# Patient Record
Sex: Male | Born: 1950 | Race: White | Hispanic: No | Marital: Single | Smoking: Light tobacco smoker
Health system: Southern US, Community
[De-identification: ages and names within clinical notes are randomized; demographics above are authoritative.]

## PROBLEM LIST (undated history)

## (undated) DIAGNOSIS — Z951 Presence of aortocoronary bypass graft: Secondary | ICD-10-CM

## (undated) DIAGNOSIS — F1011 Alcohol abuse, in remission: Secondary | ICD-10-CM

## (undated) DIAGNOSIS — Z87891 Personal history of nicotine dependence: Secondary | ICD-10-CM

## (undated) DIAGNOSIS — J449 Chronic obstructive pulmonary disease, unspecified: Secondary | ICD-10-CM

## (undated) DIAGNOSIS — R945 Abnormal results of liver function studies: Secondary | ICD-10-CM

## (undated) DIAGNOSIS — Z9889 Other specified postprocedural states: Secondary | ICD-10-CM

## (undated) DIAGNOSIS — I2510 Atherosclerotic heart disease of native coronary artery without angina pectoris: Secondary | ICD-10-CM

## (undated) DIAGNOSIS — I5020 Unspecified systolic (congestive) heart failure: Secondary | ICD-10-CM

## (undated) DIAGNOSIS — E785 Hyperlipidemia, unspecified: Secondary | ICD-10-CM

## (undated) DIAGNOSIS — D509 Iron deficiency anemia, unspecified: Secondary | ICD-10-CM

## (undated) DIAGNOSIS — E871 Hypo-osmolality and hyponatremia: Secondary | ICD-10-CM

## (undated) DIAGNOSIS — I255 Ischemic cardiomyopathy: Secondary | ICD-10-CM

## (undated) HISTORY — PX: MOUTH SURGERY: SHX715

## (undated) HISTORY — PX: NO PAST SURGERIES: SHX2092

---

## 2012-05-11 ENCOUNTER — Emergency Department (HOSPITAL_COMMUNITY): Payer: Managed Care, Other (non HMO)

## 2012-05-11 ENCOUNTER — Encounter (HOSPITAL_COMMUNITY): Payer: Self-pay

## 2012-05-11 ENCOUNTER — Emergency Department (HOSPITAL_COMMUNITY)
Admission: EM | Admit: 2012-05-11 | Discharge: 2012-05-11 | Disposition: A | Discharge status: Home / Self Care | Payer: Managed Care, Other (non HMO) | Attending: Emergency Medicine | Admitting: Emergency Medicine

## 2012-05-11 ENCOUNTER — Other Ambulatory Visit: Payer: Self-pay

## 2012-05-11 DIAGNOSIS — IMO0001 Reserved for inherently not codable concepts without codable children: Secondary | ICD-10-CM

## 2012-05-11 DIAGNOSIS — J4489 Other specified chronic obstructive pulmonary disease: Secondary | ICD-10-CM | POA: Insufficient documentation

## 2012-05-11 DIAGNOSIS — F17200 Nicotine dependence, unspecified, uncomplicated: Secondary | ICD-10-CM | POA: Insufficient documentation

## 2012-05-11 DIAGNOSIS — J449 Chronic obstructive pulmonary disease, unspecified: Secondary | ICD-10-CM | POA: Insufficient documentation

## 2012-05-11 LAB — CBC
HCT: 44.5 % (ref 39.0–52.0)
MCV: 93.5 fL (ref 78.0–100.0)
Platelets: 145 10*3/uL — ABNORMAL LOW (ref 150–400)
RBC: 4.76 MIL/uL (ref 4.22–5.81)
WBC: 9.8 10*3/uL (ref 4.0–10.5)

## 2012-05-11 LAB — COMPREHENSIVE METABOLIC PANEL
ALT: 93 U/L — ABNORMAL HIGH (ref 0–53)
AST: 112 U/L — ABNORMAL HIGH (ref 0–37)
Albumin: 3.4 g/dL — ABNORMAL LOW (ref 3.5–5.2)
Alkaline Phosphatase: 99 U/L (ref 39–117)
GFR calc Af Amer: >90 mL/min (ref >90)
Glucose, Bld: 117 mg/dL — ABNORMAL HIGH (ref 70–99)
Potassium: 3.6 mEq/L (ref 3.5–5.1)
Sodium: 134 mEq/L — ABNORMAL LOW (ref 135–145)
Total Protein: 8.7 g/dL — ABNORMAL HIGH (ref 6.0–8.3)

## 2012-05-11 MED ORDER — AMOXICILLIN 500 MG PO CAPS: 500.0000 mg | ORAL_CAPSULE | Freq: Three times a day (TID) | ORAL | Status: AC

## 2012-05-11 MED ORDER — PREDNISONE 20 MG PO TABS: ORAL_TABLET | ORAL | Status: DC

## 2012-05-11 MED ORDER — IPRATROPIUM BROMIDE 0.02 % IN SOLN
0.5000 mg | RESPIRATORY_TRACT | Status: AC
  Administered 2012-05-11: 0.5 mg via RESPIRATORY_TRACT
  Filled 2012-05-11: qty 2.5

## 2012-05-11 MED ORDER — ALBUTEROL SULFATE (5 MG/ML) 0.5% IN NEBU
5.0000 mg | INHALATION_SOLUTION | Freq: Once | RESPIRATORY_TRACT | Status: AC
  Administered 2012-05-11: 5 mg via RESPIRATORY_TRACT
  Filled 2012-05-11: qty 1

## 2012-05-11 NOTE — Discharge Instructions (Signed)
Use the inhaler or nebulizer for wheezing or shortness of breath. Take the prednisone and amoxil until gone. STOP SMOKING!! See if your wife's doctor will also be your doctor or try the [facility].  Recheck if you get a fever, struggle to breathe or feel worse.

## 2012-05-11 NOTE — ED Provider Notes (Signed)
History   This chart was scribed for Ward Givens, MD by Clarita Crane. The patient was seen in room APA18/APA18. Patient's care was started at 1354.    CSN: [id number]  Arrival date & time 05/11/12  1354   First MD Initiated Contact with Patient 05/11/12 1437      Chief Complaint  Patient presents with  . Shortness of Breath    (Consider location/radiation/quality/duration/timing/severity/associated sxs/prior treatment) HPI Miguel York is a 61 y.o. male who presents to the Emergency Department complaining of waxing and waning SOB onset several months ago and persistent since with associated wheezing and productive cough with green and white sputum. Denies chest pain currently but notes he has experience chest pain described as soreness and pressure associated with SOB previously, the last time was two nights ago when he told his wife he felt like he had something sitting on his chest.  States SOB is worse with exertion and is mildly relieved with use of his wife's breathing treatment. Denies nausea, vomiting, diarrhea, fever, chills, orthopnea, swelling of lower extremities/abdomen. Patient is a current smoker and current drinker (6 pack per day).   PCP none  History reviewed. No pertinent past medical history.  History reviewed. No pertinent past surgical history.  No family history on file. -Mother died of CHF  History  Substance Use Topics  . Smoking status: Current Everyday Smoker 1 1/2 ppd  . Smokeless tobacco: Not on file  . 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   Allergies  Review of patient's allergies indicates no known allergies.  Home Medications  No current outpatient prescriptions on file.  BP 140/82  Pulse 118  Temp 98 F (36.7 C)  Resp 22  Ht 6' (1.829 m)  Wt 175 lb  (79.379 kg)  BMI 23.73 kg/m2  SpO2 94%  Vital signs normal except for tachycardia   Vitals: hypertensive, tachycardic o/w normal Physical Exam  Nursing note and vitals reviewed. Constitutional: He is oriented to person, place, and time. He appears well-developed and well-nourished. No distress.  HENT:  Head: Normocephalic and atraumatic.  Right Ear: External ear normal.  Left Ear: External ear normal.  Mouth/Throat: Oropharynx is clear and moist.       Poor dentition  Eyes: Conjunctivae and EOM are normal. Pupils are equal, round, and reactive to light.  Neck: Neck supple. No tracheal deviation present.  Cardiovascular: Regular rhythm and normal heart sounds.  Tachycardia present.  Exam reveals no gallop and no friction rub.   No murmur heard. Pulmonary/Chest: Effort normal. No respiratory distress. He has wheezes (expiratory). He has no rales.       Rhonchi present, especially at bases bilaterally.   Abdominal: Soft. Bowel sounds are normal. He exhibits no distension. There is no tenderness.  Musculoskeletal: Normal range of motion. He exhibits no edema.  Neurological: He is alert and oriented to person, place, and time. No sensory deficit.  Skin: Skin is warm and dry.  Psychiatric: He has a normal mood and affect. His behavior is normal.    ED Course  Procedures (including critical care time)   Medications  albuterol (PROVENTIL) (5 MG/ML) 0.5% nebulizer solution 5 mg (5 mg Nebulization Given 05/11/12 1531)  ipratropium (ATROVENT) nebulizer solution 0.5 mg (0.5 mg Nebulization Given 05/11/12 1531)  Recheck 17:30 lungs now clear, states he feels better. Wife goes to Dr Felecia Shelling and has meds for nebulizer and unused inhalers.   DIAGNOSTIC STUDIES: Oxygen Saturation is 94% on room air, adequate by my interpretation.    COORDINATION OF CARE: 3:05PM-Patient informed of current plan for treatment and evaluation and agrees with plan at this time.    Results for orders placed  during the hospital encounter of 05/11/12  CBC      Component Value Range   WBC 9.8  4.0 - 10.5 K/uL   RBC 4.76  4.22 - 5.81 MIL/uL   Hemoglobin 15.4  13.0 - 17.0 g/dL   HCT 16.1  09.6 - 04.5 %   MCV 93.5  78.0 - 100.0 fL   MCH 32.4  26.0 - 34.0 pg   MCHC 34.6  30.0 - 36.0 g/dL   RDW 40.9  81.1 - 91.4 %   Platelets 145 (*) 150 - 400 K/uL  PRO B NATRIURETIC PEPTIDE      Component Value Range   Pro B Natriuretic peptide (BNP) 1842.0 (*) 0 - 125 pg/mL  TROPONIN I      Component Value Range   Troponin I <0.30  <0.30 ng/mL  COMPREHENSIVE METABOLIC PANEL      Component Value Range   Sodium 134 (*) 135 - 145 mEq/L   Potassium 3.6  3.5 - 5.1 mEq/L   Chloride 98  96 - 112 mEq/L   CO2 24  19 - 32 mEq/L   Glucose, Bld 117 (*) 70 - 99 mg/dL   BUN 12  6 - 23 mg/dL   Creatinine, Ser 7.82  0.50 - 1.35 mg/dL   Calcium 9.3  8.4 - 95.6 mg/dL   Total Protein 8.7 (*) 6.0 - 8.3 g/dL   Albumin 3.4 (*) 3.5 - 5.2 g/dL   AST 213 (*) 0 - 37 U/L   ALT 93 (*) 0 - 53 U/L   Alkaline Phosphatase 99  39 - 117 U/L   Total Bilirubin 1.9 (*) 0.3 - 1.2 mg/dL   GFR calc non Af Amer >90  >90 mL/min   GFR calc Af Amer >90  >90 mL/min   Laboratory interpretation all normal except elevated LFTs consistent with history of alcohol use, mildly elevated BNP without peripheral edema   Dg Chest 2 View  05/11/2012  *RADIOLOGY REPORT*  Clinical Data: Shortness of breath.  Worsening cough.  Smoker.  CHEST - 2 VIEW  Comparison: None.  Findings: There is mild cardiomegaly.  The thoracic aorta and hilar contours are within normal limits.  Normal pulmonary vascularity. Lung volumes are upper normal to mildly hyperexpanded.  There is a vague ill-defined opacity in the left mid lung on the frontal view that measures approximately 2.3 cm.  This is not well visualized on the lateral projection.  No visible pleural effusion.  IMPRESSION:  1.  Ill-defined faint nodular density in the left mid lung on the frontal view.  No prior chest  radiographs for comparison.  Focal airspace disease or a pulmonary nodule cannot be excluded. Consider further evaluation with chest CT (preferably with intravenous contrast) or short-term follow-up chest radiograph in 3-4 weeks. 2.  Mild cardiomegaly.  Original Report Authenticated By: Britta Mccreedy, M.D.    Date: 05/11/2012  Rate: 117  Rhythm: sinus tachycardia  QRS Axis: right  Intervals: normal  ST/T Wave abnormalities: nonspecific ST/T changes  Conduction Disutrbances:LVH  Narrative Interpretation:   Old EKG Reviewed: none available    1. COPD  with bronchitis     New Prescriptions   AMOXICILLIN (AMOXIL) 500 MG CAPSULE    Take 1 capsule (500 mg total) by mouth 3 (three) times daily.   PREDNISONE (DELTASONE) 20 MG TABLET    Take 3 po QD x 3d , then 2 po QD x 3d then 1 po QD x 3d    Plan discharge  Devoria Albe, MD, FACEP   MDM   I personally performed the services described in this documentation, which was scribed in my presence. The recorded information has been reviewed and considered.  Devoria Albe, MD, FACEP    Ward Givens, MD 05/11/12 [phone]

## 2012-05-11 NOTE — ED Notes (Signed)
Pt discharged from ED, ambulatory with family.

## 2012-05-11 NOTE — ED Notes (Signed)
Pt reports SOB x 1 month.  Says was unable to work today because of SOB.  Reports yesterday felt like someone was "sitting on his chest."  C/O chest being sore today.

## 2012-05-11 NOTE — ED Notes (Signed)
Paged RT.

## 2012-07-03 ENCOUNTER — Encounter (HOSPITAL_COMMUNITY): Payer: Self-pay

## 2012-07-03 ENCOUNTER — Emergency Department (HOSPITAL_COMMUNITY): Payer: BC Managed Care – PPO

## 2012-07-03 ENCOUNTER — Emergency Department (HOSPITAL_COMMUNITY)
Admission: EM | Admit: 2012-07-03 | Discharge: 2012-07-04 | Disposition: A | Discharge status: Home / Self Care | Payer: BC Managed Care – PPO | Attending: Emergency Medicine | Admitting: Emergency Medicine

## 2012-07-03 DIAGNOSIS — J4489 Other specified chronic obstructive pulmonary disease: Secondary | ICD-10-CM | POA: Insufficient documentation

## 2012-07-03 DIAGNOSIS — J449 Chronic obstructive pulmonary disease, unspecified: Secondary | ICD-10-CM | POA: Insufficient documentation

## 2012-07-03 DIAGNOSIS — F17200 Nicotine dependence, unspecified, uncomplicated: Secondary | ICD-10-CM | POA: Insufficient documentation

## 2012-07-03 DIAGNOSIS — R600 Localized edema: Secondary | ICD-10-CM

## 2012-07-03 DIAGNOSIS — R609 Edema, unspecified: Secondary | ICD-10-CM | POA: Insufficient documentation

## 2012-07-03 DIAGNOSIS — R062 Wheezing: Secondary | ICD-10-CM | POA: Insufficient documentation

## 2012-07-03 DIAGNOSIS — M7989 Other specified soft tissue disorders: Secondary | ICD-10-CM | POA: Insufficient documentation

## 2012-07-03 HISTORY — DX: Chronic obstructive pulmonary disease, unspecified: J44.9

## 2012-07-03 LAB — CBC
HCT: 37.7 % — ABNORMAL LOW (ref 39.0–52.0)
Hemoglobin: 13.1 g/dL (ref 13.0–17.0)
MCH: 33.4 pg (ref 26.0–34.0)
MCHC: 34.7 g/dL (ref 30.0–36.0)
MCV: 96.2 fL (ref 78.0–100.0)
Platelets: 120 10*3/uL — ABNORMAL LOW (ref 150–400)
RBC: 3.92 MIL/uL — ABNORMAL LOW (ref 4.22–5.81)
RDW: 15.3 % (ref 11.5–15.5)
WBC: 4.8 10*3/uL (ref 4.0–10.5)

## 2012-07-03 MED ORDER — IPRATROPIUM BROMIDE 0.02 % IN SOLN
0.5000 mg | Freq: Once | RESPIRATORY_TRACT | Status: AC
  Administered 2012-07-04: 0.5 mg via RESPIRATORY_TRACT
  Filled 2012-07-03: qty 2.5

## 2012-07-03 MED ORDER — ALBUTEROL SULFATE (5 MG/ML) 0.5% IN NEBU
5.0000 mg | INHALATION_SOLUTION | Freq: Once | RESPIRATORY_TRACT | Status: AC
  Administered 2012-07-04: 5 mg via RESPIRATORY_TRACT
  Filled 2012-07-03: qty 1

## 2012-07-03 NOTE — ED Notes (Signed)
Feet have been swelling for 1 week per pt. Now spreading up his legs, having pain in hips per pt.

## 2012-07-03 NOTE — ED Notes (Signed)
Pt presents secondary to bilateral dependant edema in lower extremities that have worsening pain with weight bearing. Pt states has increased work hours in past 2 weeks and stands on feet all shift. Pt denies history of cardiac, liver deficiencies. Does report history of COPD. Pt also reports needing a breathing treatment nightly. Pt smokes 1+ packs of cigarettes a day. Rt lower lobe with diminished breath sound and fine crackles throughout. RTT notified of treatment. Pulses bilateral  X 4 are per documentation. Pt is irritable with most care. Side rails up per safety. Family at bedside.

## 2012-07-03 NOTE — ED Provider Notes (Signed)
History   This chart was scribed for Raeford Razor, MD by Toya Smothers. The patient was seen in room APA09/APA09. Patient's care was started at 2208.  CSN: [id number]  Arrival date & time 07/03/12  2208   First MD Initiated Contact with Patient 07/03/12 2301      Chief Complaint  Patient presents with  . Foot Swelling   The history is provided by the patient. No language interpreter was used.    Miguel York is a 61 y.o. male with a h/o COPD present to the Emergency Department complaining of foot swelling. Pt who is typically healthy at baseline reports that moderate localized foot swelling gradually began 1 week ago. Pain is mild and aggravated with pressure and radiating up legs bilaterally. Prior to arrival Pt has treated symptoms with aleve, cold compress, and warm compress with no relief. Pt denies SOB, chest pain, and cough. Pt denotes mother having a h/o cardiac complications.    Past Medical History  Diagnosis Date  . COPD (chronic obstructive pulmonary disease)     History reviewed. No pertinent past surgical history.  History reviewed. No pertinent family history.  History  Substance Use Topics  . Smoking status: Current Everyday Smoker  . Smokeless tobacco: Not on file  . Alcohol Use: Yes     occ      Review of Systems  Constitutional: Negative for fever.       10 Systems reviewed and are negative for acute change except as noted in the HPI.  HENT: Negative for congestion.   Eyes: Negative for discharge and redness.  Respiratory: Negative for cough and shortness of breath.   Cardiovascular: Negative for chest pain.  Gastrointestinal: Negative for vomiting and abdominal pain.  Musculoskeletal: Positive for joint swelling (R+L Ankles). Negative for back pain.       Feet Swelling  Skin: Negative for rash.  Neurological: Negative for syncope, numbness and headaches.  Psychiatric/Behavioral:       No behavior change.  All other systems reviewed and are  negative.    Allergies  Review of patient's allergies indicates no known allergies.  Home Medications   Current Outpatient Rx  Name Route Sig Dispense Refill  . ALBUTEROL SULFATE HFA 108 (90 BASE) MCG/ACT IN AERS Inhalation Inhale 2 puffs into the lungs as needed.    . ALBUTEROL SULFATE (2.5 MG/3ML) 0.083% IN NEBU Nebulization Take 2.5 mg by nebulization daily as needed.    Marland Kitchen NAPROXEN SODIUM 220 MG PO TABS Oral Take 440 mg by mouth 2 (two) times daily with a meal.      BP 125/85  Pulse 75  Temp 98 F (36.7 C) (Oral)  Resp 20  Ht 6' (1.829 m)  Wt 175 lb (79.379 kg)  BMI 23.73 kg/m2  SpO2 96%  Physical Exam  Nursing note and vitals reviewed. Constitutional: He appears well-developed and well-nourished. No distress.  HENT:  Head: Normocephalic and atraumatic.  Right Ear: External ear normal.  Left Ear: External ear normal.  Eyes: Conjunctivae are normal. Right eye exhibits no discharge. Left eye exhibits no discharge. No scleral icterus.  Neck: Neck supple. No tracheal deviation present.  Cardiovascular: Normal rate, regular rhythm and intact distal pulses.   Pulmonary/Chest: Effort normal and breath sounds normal. No stridor. No respiratory distress. He has no rales.       Mild diffuse wheezing bilaterally.  Abdominal: Soft. Bowel sounds are normal. He exhibits no distension. There is no tenderness. There is no rebound and no guarding.  Musculoskeletal: He exhibits no edema and no tenderness.       Symertic pitting edema 2+. No calf tenderness. Negative Homen's Test.  Neurological: He is alert. He has normal strength. No sensory deficit. Cranial nerve deficit:  no gross defecits noted. He exhibits normal muscle tone. He displays no seizure activity. Coordination normal.  Skin: Skin is warm and dry. No rash noted.  Psychiatric: He has a normal mood and affect.    ED Course  Procedures (including critical care time) DIAGNOSTIC STUDIES: Oxygen Saturation is 96% on room  air, adequate by my interpretation.    COORDINATION OF CARE: 2317- Ordered DG Chest 2 View 1 time imaging. 2324- Evaluated Pt. Pt is without distress, awake, alert, and oriented. Will continue to monitor. 2330- Ordered albuterol (PROVENTIL) (5 MG/ML) 0.5% nebulizer solution 5 mg Once. 2330- Ordered ipratropium (ATROVENT) nebulizer solution 0.5 mg Once.    Labs Reviewed  CBC - Abnormal; Notable for the following:    RBC 3.92 (*)     HCT 37.7 (*)     Platelets 120 (*)     All other components within normal limits  PRO B NATRIURETIC PEPTIDE - Abnormal; Notable for the following:    Pro B Natriuretic peptide (BNP) 1670.0 (*)     All other components within normal limits  BASIC METABOLIC PANEL - Abnormal; Notable for the following:    Sodium 134 (*)     Glucose, Bld 130 (*)     GFR calc non Af Amer 70 (*)     GFR calc Af Amer 81 (*)     All other components within normal limits  TROPONIN I   Dg Chest 2 View  07/04/2012  *RADIOLOGY REPORT*  Clinical Data: Lower extremity swelling, COPD.  CHEST - 2 VIEW  Comparison: 05/11/2012  Findings: Small left pleural effusion with associated consolidation.  Cardiomegaly.  No pneumothorax.  Mild central vascular congestion without overt edema.  No acute osseous finding. Acromioclavicular degenerative change.  IMPRESSION: Cardiomegaly with central vascular congestion.  No overt edema.  Small left pleural effusion with associated consolidation; atelectasis versus pneumonia.  Original Report Authenticated By: Waneta Martins, M.D.   EKG:  Rhythm: ventricular bigeminy Rate: 112 Axis: normal Intervals/Conduction: LVH ST segments: Ns St changes.   1. Bilateral lower extremity edema       MDM  60yM with symmetric b/l LE edema. Renal function normal. No hx of known hepatic dysfunction but drinking daily. Doubt DVT. CXR with cardiomegaly. Ventricular bigeminy on EKG. Pt with no known structural heart disease but hasn't had previous formal  evaluation. Script for a few days of lasix and cardiology fu.    I personally preformed the services scribed in my presence. The recorded information has been reviewed and considered. Raeford Razor, MD.     Raeford Razor, MD 07/07/12 [phone]

## 2012-07-04 LAB — PRO B NATRIURETIC PEPTIDE: Pro B Natriuretic peptide (BNP): 1670 pg/mL — ABNORMAL HIGH (ref 0–125)

## 2012-07-04 LAB — BASIC METABOLIC PANEL
BUN: 15 mg/dL (ref 6–23)
CO2: 22 mEq/L (ref 19–32)
Calcium: 9.1 mg/dL (ref 8.4–10.5)
Chloride: 101 mEq/L (ref 96–112)
Creatinine, Ser: 1.12 mg/dL (ref 0.50–1.35)
GFR calc Af Amer: 81 mL/min — ABNORMAL LOW (ref >90)
GFR calc non Af Amer: 70 mL/min — ABNORMAL LOW (ref >90)
Glucose, Bld: 130 mg/dL — ABNORMAL HIGH (ref 70–99)
Potassium: 3.7 mEq/L (ref 3.5–5.1)
Sodium: 134 mEq/L — ABNORMAL LOW (ref 135–145)

## 2012-07-04 LAB — TROPONIN I: Troponin I: <0.3 ng/mL (ref <0.30)

## 2012-07-04 MED ORDER — FUROSEMIDE 10 MG/ML IJ SOLN
40.0000 mg | Freq: Once | INTRAMUSCULAR | Status: AC
  Administered 2012-07-04: 40 mg via INTRAVENOUS
  Filled 2012-07-04: qty 4

## 2012-07-04 MED ORDER — METOPROLOL TARTRATE 1 MG/ML IV SOLN
5.0000 mg | Freq: Once | INTRAVENOUS | Status: AC
  Administered 2012-07-04: 5 mg via INTRAVENOUS
  Filled 2012-07-04: qty 5

## 2012-07-04 MED ORDER — FUROSEMIDE 20 MG PO TABS: 20.0000 mg | ORAL_TABLET | Freq: Every day | ORAL | Status: DC

## 2012-07-04 MED ORDER — METOPROLOL TARTRATE 1 MG/ML IV SOLN: 5.0000 mg | Freq: Once | INTRAVENOUS | Status: DC

## 2012-07-04 NOTE — ED Notes (Signed)
Patient finished with breathing treatment at this time.

## 2012-07-06 ENCOUNTER — Ambulatory Visit (HOSPITAL_COMMUNITY)
Admission: RE | Admit: 2012-07-06 | Discharge: 2012-07-06 | Disposition: A | Discharge status: Home / Self Care | Payer: BC Managed Care – PPO | Source: Ambulatory Visit | Attending: Adult Health | Admitting: Adult Health

## 2012-07-06 ENCOUNTER — Ambulatory Visit (INDEPENDENT_AMBULATORY_CARE_PROVIDER_SITE_OTHER): Payer: BC Managed Care – PPO | Admitting: Adult Health

## 2012-07-06 ENCOUNTER — Encounter: Payer: Self-pay | Admitting: Adult Health

## 2012-07-06 VITALS — BP 120/88 | HR 104 | Wt 175.0 lb

## 2012-07-06 DIAGNOSIS — F17200 Nicotine dependence, unspecified, uncomplicated: Secondary | ICD-10-CM | POA: Insufficient documentation

## 2012-07-06 DIAGNOSIS — J449 Chronic obstructive pulmonary disease, unspecified: Secondary | ICD-10-CM | POA: Insufficient documentation

## 2012-07-06 DIAGNOSIS — I509 Heart failure, unspecified: Secondary | ICD-10-CM

## 2012-07-06 DIAGNOSIS — F1020 Alcohol dependence, uncomplicated: Secondary | ICD-10-CM | POA: Insufficient documentation

## 2012-07-06 DIAGNOSIS — R0602 Shortness of breath: Secondary | ICD-10-CM

## 2012-07-06 DIAGNOSIS — J4489 Other specified chronic obstructive pulmonary disease: Secondary | ICD-10-CM | POA: Insufficient documentation

## 2012-07-06 DIAGNOSIS — R209 Unspecified disturbances of skin sensation: Secondary | ICD-10-CM

## 2012-07-06 DIAGNOSIS — I428 Other cardiomyopathies: Secondary | ICD-10-CM

## 2012-07-06 DIAGNOSIS — I059 Rheumatic mitral valve disease, unspecified: Secondary | ICD-10-CM

## 2012-07-06 DIAGNOSIS — Z720 Tobacco use: Secondary | ICD-10-CM

## 2012-07-06 DIAGNOSIS — Z8249 Family history of ischemic heart disease and other diseases of the circulatory system: Secondary | ICD-10-CM | POA: Insufficient documentation

## 2012-07-06 DIAGNOSIS — F1010 Alcohol abuse, uncomplicated: Secondary | ICD-10-CM | POA: Insufficient documentation

## 2012-07-06 DIAGNOSIS — I429 Cardiomyopathy, unspecified: Secondary | ICD-10-CM | POA: Insufficient documentation

## 2012-07-06 LAB — HEPATIC FUNCTION PANEL
ALT: 52 U/L (ref 0–53)
AST: 91 U/L — ABNORMAL HIGH (ref 0–37)
Alkaline Phosphatase: 81 U/L (ref 39–117)
Bilirubin, Direct: 0.4 mg/dL — ABNORMAL HIGH (ref 0.0–0.3)
Indirect Bilirubin: 0.9 mg/dL (ref 0.0–0.9)
Total Bilirubin: 1.3 mg/dL — ABNORMAL HIGH (ref 0.3–1.2)

## 2012-07-06 LAB — LIPID PANEL
Cholesterol: 145 mg/dL (ref 0–200)
Total CHOL/HDL Ratio: 4 Ratio

## 2012-07-06 LAB — BASIC METABOLIC PANEL
BUN: 12 mg/dL (ref 6–23)
CO2: 29 mEq/L (ref 19–32)
Calcium: 9.2 mg/dL (ref 8.4–10.5)
Creat: 0.84 mg/dL (ref 0.50–1.35)
Glucose, Bld: 88 mg/dL (ref 70–99)
Sodium: 138 mEq/L (ref 135–145)

## 2012-07-06 MED ORDER — FUROSEMIDE 40 MG PO TABS: 40.0000 mg | ORAL_TABLET | Freq: Every day | ORAL | Status: DC

## 2012-07-06 MED ORDER — POTASSIUM CHLORIDE CRYS ER 20 MEQ PO TBCR: 20.0000 meq | EXTENDED_RELEASE_TABLET | Freq: Every day | ORAL | Status: DC

## 2012-07-06 MED ORDER — CARVEDILOL 3.125 MG PO TABS: 3.1250 mg | ORAL_TABLET | Freq: Two times a day (BID) | ORAL | Status: DC

## 2012-07-06 NOTE — Assessment & Plan Note (Signed)
He is counseled on tobacco abuse and its danger in leading to coronary artery disease. He is advised to quit smoking as soon as possible. He has had a long standing history of this and it will become a difficult issue for him but he is encouraged to try to stop.

## 2012-07-06 NOTE — Progress Notes (Signed)
HPI: [name] is a 61 year old Caucasian male patient with no prior cardiac history who presents for office today on ER followup. He was seen in the emergency room on 8/ 11/ 2013 with complaints of lower extremity edema. He was seen by Dr. Baldemar Friday, and Toya Smothers. Labs were completed revealing a pro BNP of 1670, platelets 420, sodium 134, he had a chest x-ray completed revealing cardiomegaly with central venous congestion but no overt edema. He had a small left pleural effusion associated with consolidation atelectasis vs. pneumonia. He was started on Proventil 5 mg nebulizers and Atrovent on discharge. He was started on Lasix 20 mg daily and asked to followup with cardiology. He does not have a primary care physician.    The patient Has a long-standing history of alcohol abuse drinking 18-2412 ounce beers a day, with heavy whiskey use up to half a gallon a day in the past. He continues to smoke heavily and has  a 46 year to 3 pack-a-day smoking history. He is currently drinking 2-3 beers or more a day and is down to a pack a day of smoking. He has a remote history of marijuana and cocaine use. He has a strong family history for coronary artery disease with his brother and father was CAD, MI, and stent placements.    He states he continues to have some lower extremity edema, mild dyspnea on exertion but denies any chest pain. He does not have any history of hypercholesterolemia or diabetes. He is currently without complaint with the exception of lower extremity edema.  No Known Allergies  Current Outpatient Prescriptions  Medication Sig Dispense Refill  . albuterol (PROVENTIL HFA;VENTOLIN HFA) 108 (90 BASE) MCG/ACT inhaler Inhale 2 puffs into the lungs as needed.      Marland Kitchen albuterol (PROVENTIL) (2.5 MG/3ML) 0.083% nebulizer solution Take 2.5 mg by nebulization daily as needed.      Marland Kitchen aspirin 81 MG tablet Take 81 mg by mouth daily.      . fish oil-omega-3 fatty acids 1000 MG capsule Take  1.2 g by mouth daily.      . Multiple Vitamin (MULTIVITAMIN) tablet Take 1 tablet by mouth daily.      Marland Kitchen DISCONTD: furosemide (LASIX) 20 MG tablet Take 1 tablet (20 mg total) by mouth daily.  4 tablet  0  . carvedilol (COREG) 3.125 MG tablet Take 1 tablet (3.125 mg total) by mouth 2 (two) times daily.  60 tablet  11  . furosemide (LASIX) 40 MG tablet Take 1 tablet (40 mg total) by mouth daily.  30 tablet  11  . potassium chloride SA (K-DUR,KLOR-CON) 20 MEQ tablet Take 1 tablet (20 mEq total) by mouth daily.  30 tablet  11  . DISCONTD: carvedilol (COREG) 3.125 MG tablet Take 1 tablet (3.125 mg total) by mouth 2 (two) times daily.  60 tablet  11  . DISCONTD: furosemide (LASIX) 40 MG tablet Take 1 tablet (40 mg total) by mouth daily.  30 tablet  11  . DISCONTD: potassium chloride SA (K-DUR,KLOR-CON) 20 MEQ tablet Take 1 tablet (20 mEq total) by mouth daily.  30 tablet  11    Past Medical History  Diagnosis Date  . COPD (chronic obstructive pulmonary disease)     History reviewed. No pertinent past surgical history.  Family History  Problem Relation Age of Onset  . Heart disease Mother   . Heart attack Mother   . Heart failure Mother   . Heart attack Sister   .  Heart disease Sister     History   Social History  . Marital Status: Divorced    Spouse Name: N/A    Number of Children: N/A  . Years of Education: N/A   Occupational History  . Not on file.   Social History Main Topics  . Smoking status: Current Everyday Smoker -- 1.5 packs/day for 46 years  . Smokeless tobacco: Not on file  . Alcohol Use: 50.4 oz/week    84 Cans of beer per week     occ  . Drug Use: No     former crack and marijuana  . Sexually Active: Yes   Other Topics Concern  . Not on file   Social History Narrative  . No narrative on file   He works for a [employer], he is separated, and has 4 children locally  ZOX:WRUEAV of systems complete and found to be negative unless listed above  PHYSICAL  EXAM BP 120/88  Pulse 104  Wt 175 lb (79.379 kg)  SpO2 96%  General: Well developed, well nourished, in no acute distress, thin Head: Eyes PERRLA, No xanthomas.   Normal cephalic and atramatic  Lungs: Clear bilaterally to auscultation and percussion. Heart: HRRR S1 S2, without MRG.  Pulses are 2+ & equal.            No carotid bruit. Positive JVD with HJR noted  No abdominal bruits. No femoral bruits. Abdomen: Bowel sounds are positive, abdomen soft and non-tender without masses or                  Hernia's noted. Msk:  Back normal, normal gait. Normal strength and tone for age. Extremities: No clubbing, cyanosis 2+ edema to the knees bilaterally  DP +1 Neuro: Alert and oriented X 3. Psych:  Good affect, responds appropriately  EKG: Sinus tachycardia with frequent PVC's, T-wave inversion with LVH. (Reviewed by Dr. Daleen Squibb on site)  ASSESSMENT AND PLAN

## 2012-07-06 NOTE — Patient Instructions (Addendum)
Your physician recommends that you schedule a follow-up appointment in: 1 weeks  Your physician has requested that you have an echocardiogram. Echocardiography is a painless test that uses sound waves to create images of your heart. It provides your doctor with information about the size and shape of your heart and how well your heart's chambers and valves are working. This procedure takes approximately one hour. There are no restrictions for this procedure.  Your physician has recommended you make the following change in your medication:  1 - INCREASE Lasix 40 mg daily 2 - START Coreg 3.125 mg twice a day 3 - START Aspirin 81 mg daily  Your physician recommends that you return for lab work in: Today

## 2012-07-06 NOTE — Assessment & Plan Note (Signed)
He has a history of drinking up to 18-24 beers daily and a half a gallon of whiskey daily. He states that he has cut way back on the whiskey and is down to several beers at the day only which vague concerning the amount. He admits to having a beer today prior to coming in. He has been counseled on whiskey use and its effect on his heart leading to his cardiomyopathy. Would recommend Alcoholics Anonymous should he not be able to cut down on its own. Dr. wall and spoken with him and has suggested that he only drink three , 12 ounce beers a day, and began to wean down. I suspect with his ongoing long-term problem, he will need more aggressive assistance in this.

## 2012-07-06 NOTE — Assessment & Plan Note (Signed)
Chest x-ray completed on 07/03/2012 demonstrated cardiomegaly, with central vascular congestion. 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 I will plan an echocardiogram today for LV function. We will start him on Coreg 3.125 mg twice a day, increase his Lasix to 40 mg daily, add potassium 20 mEq daily. We will wait on adding ACE inhibitor at this time until renal function is fully assess with increased dose of Lasix. He has been counseled on tobacco and EtOH cessation as this is clearly contributed to his current status. We will have more recommendations once echocardiogram is completed concerning medical management. He will be seen in one week. We will also do risk stratification with lipids and LFTs. Followup kidney function will also be completed. At this time would not proceed with cardiac catheterization unless further evidence would lead [place] in that direction. He has been seen by Dr. Elijah Birk wall on side as well at the time of this visit. He has reviewed my plan and is in agreement with this.

## 2012-07-06 NOTE — Assessment & Plan Note (Signed)
He should continue on his inhalers. And as above smoking cessation is highly recommended and insisted upon by myself in our counseling.

## 2012-07-06 NOTE — Progress Notes (Signed)
*  PRELIMINARY RESULTS* Echocardiogram 2D Echocardiogram has been performed.  Miguel York 07/06/2012, 11:27 AM

## 2012-07-07 ENCOUNTER — Telehealth: Payer: Self-pay

## 2012-07-07 ENCOUNTER — Telehealth: Payer: Self-pay | Admitting: Adult Health

## 2012-07-07 DIAGNOSIS — I428 Other cardiomyopathies: Secondary | ICD-10-CM

## 2012-07-07 NOTE — Telephone Encounter (Signed)
**Note De-identified Miguel York Obfuscation** Please advise./LV 

## 2012-07-07 NOTE — Telephone Encounter (Signed)
Pt. Advised, he verbalized understanding./LV 

## 2012-07-07 NOTE — Telephone Encounter (Signed)
No answer and no way to leave message, will continue to call./LV 

## 2012-07-07 NOTE — Telephone Encounter (Signed)
If patient is feeling like something is sitting on his chest, he needs to go to ER or be re-evaluated and possibly be scheduled for a stress test. He should contact his primary Dr. For a sleep aid

## 2012-07-07 NOTE — Telephone Encounter (Signed)
PT WAS SEEN YESTERDAY. HE HADTROUBLE SLEEPING LAST NIGHT AND FELT LIKE SOMETHING WAS SITTING ON HIS CHEST.  WANTING TO KNOW IF HE CAN TAKE SOME NATURAL OVER THE COUNTER MEDS TO HELP HIM SLEEP.

## 2012-07-07 NOTE — Telephone Encounter (Signed)
**Note De-identified Miguel York Obfuscation** Pt. advised, he verbalized understanding./LV 

## 2012-07-09 ENCOUNTER — Other Ambulatory Visit: Payer: Self-pay | Admitting: *Deleted

## 2012-07-09 MED ORDER — LISINOPRIL 5 MG PO TABS: 5.0000 mg | ORAL_TABLET | Freq: Every day | ORAL | Status: DC

## 2012-07-09 MED ORDER — SPIRONOLACTONE 25 MG PO TABS: 25.0000 mg | ORAL_TABLET | Freq: Every day | ORAL | Status: DC

## 2012-07-09 MED ORDER — CARVEDILOL 3.125 MG PO TABS: 6.2500 mg | ORAL_TABLET | Freq: Two times a day (BID) | ORAL | Status: DC

## 2012-07-12 LAB — BASIC METABOLIC PANEL
BUN: 21 mg/dL (ref 6–23)
CO2: 28 mEq/L (ref 19–32)
Calcium: 9.1 mg/dL (ref 8.4–10.5)
Creat: 1.12 mg/dL (ref 0.50–1.35)
Glucose, Bld: 102 mg/dL — ABNORMAL HIGH (ref 70–99)

## 2012-07-14 ENCOUNTER — Ambulatory Visit (INDEPENDENT_AMBULATORY_CARE_PROVIDER_SITE_OTHER): Payer: BC Managed Care – PPO | Admitting: Adult Health

## 2012-07-14 ENCOUNTER — Encounter: Payer: Self-pay | Admitting: Adult Health

## 2012-07-14 ENCOUNTER — Encounter: Payer: Self-pay | Admitting: *Deleted

## 2012-07-14 VITALS — BP 90/70 | HR 80 | Wt 177.0 lb

## 2012-07-14 DIAGNOSIS — I429 Cardiomyopathy, unspecified: Secondary | ICD-10-CM

## 2012-07-14 DIAGNOSIS — I519 Heart disease, unspecified: Secondary | ICD-10-CM

## 2012-07-14 DIAGNOSIS — I428 Other cardiomyopathies: Secondary | ICD-10-CM

## 2012-07-14 DIAGNOSIS — F1010 Alcohol abuse, uncomplicated: Secondary | ICD-10-CM

## 2012-07-14 NOTE — Patient Instructions (Addendum)
Your physician recommends that you schedule a follow-up appointment in: After cardiac cath

## 2012-07-14 NOTE — Assessment & Plan Note (Signed)
Miguel York has severe systolic dysfunction per echocardiogram with an LVEF of 15%, he has been placed on optimal medication therapy, will need cardiac catheterization for further evaluation of ischemic etiology for systolic dysfunction. I believe this to be more related to alcohol abuse however he has multiple risk factors. I discussed the risks and benefits of cardiac catheterization. He verbalizes understanding and is willing to proceed, in the interim he will continue his medications as directed. He is scheduled for 12 PM, July 15 2012, at the [facility] lab at [facility] Dr.McAlhany will perform the procedure. More recommendations after catheterization.

## 2012-07-14 NOTE — Progress Notes (Signed)
 HPI: Miguel York is a 61-year-old Caucasian male patient with no prior cardiac history who was seen in our office last week after evaluation in the emergency room on 07/04/2012 with complaints of lower extremity edema. The patient has a long-standing history of back all abuse, heavy whiskey use of an tell the last 3 months. He has recently stopped smoking approximately one week ago. He states he has not had any alcohol for the last 2 weeks. 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 He was started on Coreg 3.125 mg twice a day, and his Lasix was increased to 40 mg daily with 20 mg of potassium.    Echocardiogram which was completed on 07/06/2012 revealed severely reduced LV function with an EF of 15%. The LV size with mildly to moderately dilated with very mild hypertrophy of the septum. Only the  inferiolateral base contracted normally. Other segments were severely hypokinetic to akinetic. As a result of this the patient's medications were again adjusted with increase in his Coreg to 6.25 mg twice a day, addition a spinal lactone 25 mg daily, and lisinopril 5 mg daily. Nitrates and hydralazine were not added at that time secondary to soft blood pressure. He is here today to discuss results of the echo, and for further recommendations. He has not had any further complaints of shortness of breath or lower extremity edema.  No Known Allergies  Current Outpatient Prescriptions  Medication Sig Dispense Refill  . albuterol (PROVENTIL HFA;VENTOLIN HFA) 108 (90 BASE) MCG/ACT inhaler Inhale 2 puffs into the lungs as needed.      . albuterol (PROVENTIL) (2.5 MG/3ML) 0.083% nebulizer solution Take 2.5 mg by nebulization daily as needed.      . aspirin 81 MG tablet Take 81 mg by mouth daily.      . carvedilol (COREG) 3.125 MG tablet Take 2 tablets (6.25 mg total) by mouth 2 (two) times daily with a meal.  60 tablet  11  . fish oil-omega-3 fatty  acids 1000 MG capsule Take 1.2 g by mouth daily.      . furosemide (LASIX) 40 MG tablet Take 1 tablet (40 mg total) by mouth daily.  30 tablet  11  . lisinopril (PRINIVIL,ZESTRIL) 5 MG tablet Take 1 tablet (5 mg total) by mouth daily.  90 tablet  3  . Multiple Vitamin (MULTIVITAMIN) tablet Take 1 tablet by mouth daily.      . potassium chloride SA (K-DUR,KLOR-CON) 20 MEQ tablet Take 1 tablet (20 mEq total) by mouth daily.  30 tablet  11  . spironolactone (ALDACTONE) 25 MG tablet Take 1 tablet (25 mg total) by mouth daily.  90 tablet  3    Past Medical History  Diagnosis Date  . COPD (chronic obstructive pulmonary disease)     ROS: PHYSICAL EXAM BP 90/70  Pulse 80  Wt 177 lb (80.287 kg)  SpO2 98%  General: Well developed, well nourished, in no acute distress Head: Eyes PERRLA, No xanthomas.   Normal cephalic and atramatic  Lungs: Clear bilaterally to auscultation and percussion. Heart: HRRR S1 S2, distant heart sounds. .  Pulses are 2+ & equal.            No carotid bruit. No JVD.  No abdominal bruits. No femoral bruits. Abdomen: Bowel sounds are positive, abdomen soft and non-tender without masses or                  Hernia's   noted. Msk:  Back normal, normal gait. Normal strength and tone for age. Extremities: No clubbing, cyanosis, non-pitting edema.  DP +1 Neuro: Alert and oriented X 3. Psych:  Good affect, responds appropriately    ASSESSMENT AND PLAN 

## 2012-07-14 NOTE — Assessment & Plan Note (Signed)
He states that he no longer drinks alcohol, his wife who is with him confirms this. He is also stop smoking. I have asked him to continue with this abstinence. I explained to him the damage that it may have done to his heart and that he could not afford to restart alcohol as this would lead to an untimely  death.

## 2012-07-15 ENCOUNTER — Inpatient Hospital Stay (HOSPITAL_COMMUNITY)
Admission: AD | Admit: 2012-07-15 | Discharge: 2012-07-28 | DRG: 545 | Disposition: A | Discharge status: Home / Self Care | Payer: BC Managed Care – PPO | Source: Ambulatory Visit | Attending: Thoracic Surgery (Cardiothoracic Vascular Surgery) | Admitting: Thoracic Surgery (Cardiothoracic Vascular Surgery)

## 2012-07-15 ENCOUNTER — Encounter (HOSPITAL_COMMUNITY): Payer: Self-pay | Admitting: Thoracic Surgery (Cardiothoracic Vascular Surgery)

## 2012-07-15 ENCOUNTER — Inpatient Hospital Stay (HOSPITAL_BASED_OUTPATIENT_CLINIC_OR_DEPARTMENT_OTHER)
Admission: RE | Admit: 2012-07-15 | Discharge: 2012-07-15 | Disposition: A | Discharge status: Hospice | Payer: BC Managed Care – PPO | Source: Ambulatory Visit | Attending: Cardiovascular Disease | Admitting: Cardiovascular Disease

## 2012-07-15 ENCOUNTER — Other Ambulatory Visit: Payer: Self-pay | Admitting: *Deleted

## 2012-07-15 ENCOUNTER — Encounter (HOSPITAL_BASED_OUTPATIENT_CLINIC_OR_DEPARTMENT_OTHER)
Admission: RE | Disposition: A | Discharge status: Hospice | Payer: Self-pay | Source: Ambulatory Visit | Attending: Cardiovascular Disease

## 2012-07-15 DIAGNOSIS — Z79899 Other long term (current) drug therapy: Secondary | ICD-10-CM

## 2012-07-15 DIAGNOSIS — K029 Dental caries, unspecified: Secondary | ICD-10-CM | POA: Diagnosis present

## 2012-07-15 DIAGNOSIS — I519 Heart disease, unspecified: Secondary | ICD-10-CM

## 2012-07-15 DIAGNOSIS — R0601 Orthopnea: Secondary | ICD-10-CM | POA: Diagnosis present

## 2012-07-15 DIAGNOSIS — K036 Deposits [accretions] on teeth: Secondary | ICD-10-CM | POA: Diagnosis present

## 2012-07-15 DIAGNOSIS — Z951 Presence of aortocoronary bypass graft: Secondary | ICD-10-CM

## 2012-07-15 DIAGNOSIS — J90 Pleural effusion, not elsewhere classified: Secondary | ICD-10-CM | POA: Diagnosis present

## 2012-07-15 DIAGNOSIS — R7309 Other abnormal glucose: Secondary | ICD-10-CM | POA: Diagnosis present

## 2012-07-15 DIAGNOSIS — I079 Rheumatic tricuspid valve disease, unspecified: Secondary | ICD-10-CM | POA: Diagnosis present

## 2012-07-15 DIAGNOSIS — D62 Acute posthemorrhagic anemia: Secondary | ICD-10-CM | POA: Diagnosis present

## 2012-07-15 DIAGNOSIS — J449 Chronic obstructive pulmonary disease, unspecified: Secondary | ICD-10-CM | POA: Diagnosis present

## 2012-07-15 DIAGNOSIS — I059 Rheumatic mitral valve disease, unspecified: Secondary | ICD-10-CM | POA: Diagnosis present

## 2012-07-15 DIAGNOSIS — J4489 Other specified chronic obstructive pulmonary disease: Secondary | ICD-10-CM | POA: Diagnosis present

## 2012-07-15 DIAGNOSIS — K083 Retained dental root: Secondary | ICD-10-CM | POA: Diagnosis present

## 2012-07-15 DIAGNOSIS — I2789 Other specified pulmonary heart diseases: Secondary | ICD-10-CM | POA: Diagnosis present

## 2012-07-15 DIAGNOSIS — K0530 Chronic periodontitis, unspecified: Secondary | ICD-10-CM | POA: Diagnosis present

## 2012-07-15 DIAGNOSIS — Z952 Presence of prosthetic heart valve: Secondary | ICD-10-CM

## 2012-07-15 DIAGNOSIS — I429 Cardiomyopathy, unspecified: Secondary | ICD-10-CM | POA: Diagnosis present

## 2012-07-15 DIAGNOSIS — I509 Heart failure, unspecified: Secondary | ICD-10-CM

## 2012-07-15 DIAGNOSIS — Z9889 Other specified postprocedural states: Secondary | ICD-10-CM

## 2012-07-15 DIAGNOSIS — I517 Cardiomegaly: Secondary | ICD-10-CM | POA: Diagnosis present

## 2012-07-15 DIAGNOSIS — I2510 Atherosclerotic heart disease of native coronary artery without angina pectoris: Principal | ICD-10-CM | POA: Diagnosis present

## 2012-07-15 DIAGNOSIS — Z720 Tobacco use: Secondary | ICD-10-CM

## 2012-07-15 DIAGNOSIS — I5023 Acute on chronic systolic (congestive) heart failure: Secondary | ICD-10-CM | POA: Diagnosis present

## 2012-07-15 DIAGNOSIS — I5020 Unspecified systolic (congestive) heart failure: Secondary | ICD-10-CM

## 2012-07-15 DIAGNOSIS — Z87891 Personal history of nicotine dependence: Secondary | ICD-10-CM

## 2012-07-15 DIAGNOSIS — K0889 Other specified disorders of teeth and supporting structures: Secondary | ICD-10-CM | POA: Diagnosis present

## 2012-07-15 DIAGNOSIS — Z7982 Long term (current) use of aspirin: Secondary | ICD-10-CM

## 2012-07-15 DIAGNOSIS — D696 Thrombocytopenia, unspecified: Secondary | ICD-10-CM | POA: Diagnosis present

## 2012-07-15 DIAGNOSIS — F17200 Nicotine dependence, unspecified, uncomplicated: Secondary | ICD-10-CM | POA: Insufficient documentation

## 2012-07-15 HISTORY — DX: Atherosclerotic heart disease of native coronary artery without angina pectoris: I25.10

## 2012-07-15 HISTORY — DX: Unspecified systolic (congestive) heart failure: I50.20

## 2012-07-15 HISTORY — DX: Other specified postprocedural states: Z98.890

## 2012-07-15 HISTORY — DX: Presence of aortocoronary bypass graft: Z95.1

## 2012-07-15 LAB — POCT I-STAT 3, ART BLOOD GAS (G3+)
Acid-base deficit: 2 mmol/L (ref 0.0–2.0)
Bicarbonate: 21.6 mEq/L (ref 20.0–24.0)
O2 Saturation: 96 %
pCO2 arterial: 32 mmHg — ABNORMAL LOW (ref 35.0–45.0)
pO2, Arterial: 76 mmHg — ABNORMAL LOW (ref 80.0–100.0)

## 2012-07-15 LAB — POCT I-STAT 3, VENOUS BLOOD GAS (G3P V)
Acid-Base Excess: 1 mmol/L (ref 0.0–2.0)
O2 Saturation: 61 %
TCO2: 27 mmol/L (ref 0–100)
pCO2, Ven: 41.3 mmHg — ABNORMAL LOW (ref 45.0–50.0)

## 2012-07-15 SURGERY — JV LEFT HEART CATHETERIZATION WITH CORONARY ANGIOGRAM
Anesthesia: Moderate Sedation

## 2012-07-15 MED ORDER — CHLORHEXIDINE GLUCONATE 0.12 % MT SOLN
15.0000 mL | Freq: Two times a day (BID) | OROMUCOSAL | Status: DC
  Administered 2012-07-15 – 2012-07-25 (×17): 15 mL via OROMUCOSAL
  Filled 2012-07-15 (×21): qty 15

## 2012-07-15 MED ORDER — ACETAMINOPHEN 325 MG PO TABS: 650.0000 mg | ORAL_TABLET | ORAL | Status: DC | PRN

## 2012-07-15 MED ORDER — ASPIRIN 300 MG RE SUPP: 300.0000 mg | RECTAL | Status: DC

## 2012-07-15 MED ORDER — CARVEDILOL 3.125 MG PO TABS
3.1250 mg | ORAL_TABLET | Freq: Two times a day (BID) | ORAL | Status: DC
  Administered 2012-07-15 – 2012-07-22 (×12): 3.125 mg via ORAL
  Filled 2012-07-15 (×19): qty 1

## 2012-07-15 MED ORDER — LISINOPRIL 5 MG PO TABS
5.0000 mg | ORAL_TABLET | Freq: Every day | ORAL | Status: DC
  Administered 2012-07-16 – 2012-07-21 (×6): 5 mg via ORAL
  Filled 2012-07-15 (×8): qty 1

## 2012-07-15 MED ORDER — SODIUM CHLORIDE 0.9 % IV SOLN: INTRAVENOUS | Status: DC

## 2012-07-15 MED ORDER — DIAZEPAM 5 MG PO TABS
5.0000 mg | ORAL_TABLET | ORAL | Status: AC
  Administered 2012-07-15: 5 mg via ORAL

## 2012-07-15 MED ORDER — SODIUM CHLORIDE 0.9 % IV SOLN: 250.0000 mL | INTRAVENOUS | Status: DC | PRN

## 2012-07-15 MED ORDER — SPIRONOLACTONE 25 MG PO TABS
25.0000 mg | ORAL_TABLET | Freq: Every day | ORAL | Status: DC
  Administered 2012-07-15 – 2012-07-22 (×8): 25 mg via ORAL
  Filled 2012-07-15 (×8): qty 1

## 2012-07-15 MED ORDER — NITROGLYCERIN 0.4 MG SL SUBL: 0.4000 mg | SUBLINGUAL_TABLET | SUBLINGUAL | Status: DC | PRN

## 2012-07-15 MED ORDER — ASPIRIN 81 MG PO TABS: 81.0000 mg | ORAL_TABLET | Freq: Every day | ORAL | Status: DC

## 2012-07-15 MED ORDER — ASPIRIN 81 MG PO CHEW: 324.0000 mg | CHEWABLE_TABLET | ORAL | Status: DC

## 2012-07-15 MED ORDER — ALBUTEROL SULFATE (5 MG/ML) 0.5% IN NEBU
2.5000 mg | INHALATION_SOLUTION | Freq: Four times a day (QID) | RESPIRATORY_TRACT | Status: DC | PRN
  Administered 2012-07-16: 2.5 mg via RESPIRATORY_TRACT

## 2012-07-15 MED ORDER — CARVEDILOL 6.25 MG PO TABS: 6.2500 mg | ORAL_TABLET | Freq: Two times a day (BID) | ORAL | Status: DC

## 2012-07-15 MED ORDER — SODIUM CHLORIDE 0.9 % IJ SOLN: 3.0000 mL | INTRAMUSCULAR | Status: DC | PRN

## 2012-07-15 MED ORDER — ASPIRIN 81 MG PO CHEW
324.0000 mg | CHEWABLE_TABLET | ORAL | Status: AC
  Administered 2012-07-15: 324 mg via ORAL

## 2012-07-15 MED ORDER — SODIUM CHLORIDE 0.9 % IJ SOLN
3.0000 mL | Freq: Two times a day (BID) | INTRAMUSCULAR | Status: DC
  Administered 2012-07-15 – 2012-07-22 (×13): 3 mL via INTRAVENOUS

## 2012-07-15 MED ORDER — SODIUM CHLORIDE 0.9 % IV SOLN
INTRAVENOUS | Status: DC
  Administered 2012-07-15: 11:00:00 via INTRAVENOUS

## 2012-07-15 MED ORDER — ALBUTEROL SULFATE HFA 108 (90 BASE) MCG/ACT IN AERS
2.0000 | INHALATION_SPRAY | Freq: Four times a day (QID) | RESPIRATORY_TRACT | Status: DC
Start: 2012-07-15 — End: 2012-07-17
  Administered 2012-07-16 – 2012-07-17 (×5): 2 via RESPIRATORY_TRACT
  Filled 2012-07-15: qty 6.7

## 2012-07-15 MED ORDER — SODIUM CHLORIDE 0.9 % IJ SOLN: 3.0000 mL | Freq: Two times a day (BID) | INTRAMUSCULAR | Status: DC

## 2012-07-15 MED ORDER — FUROSEMIDE 10 MG/ML IJ SOLN: 80.0000 mg | Freq: Two times a day (BID) | INTRAMUSCULAR | Status: DC

## 2012-07-15 MED ORDER — SPIRONOLACTONE 25 MG PO TABS: 25.0000 mg | ORAL_TABLET | Freq: Every day | ORAL | Status: DC

## 2012-07-15 MED ORDER — FUROSEMIDE 10 MG/ML IJ SOLN
80.0000 mg | Freq: Two times a day (BID) | INTRAMUSCULAR | Status: DC
  Administered 2012-07-15 – 2012-07-16 (×3): 80 mg via INTRAVENOUS
  Filled 2012-07-15: qty 8
  Filled 2012-07-15: qty 4
  Filled 2012-07-15 (×5): qty 8

## 2012-07-15 MED ORDER — ALBUTEROL SULFATE HFA 108 (90 BASE) MCG/ACT IN AERS: 2.0000 | INHALATION_SPRAY | RESPIRATORY_TRACT | Status: DC

## 2012-07-15 MED ORDER — LISINOPRIL 5 MG PO TABS: 5.0000 mg | ORAL_TABLET | Freq: Every day | ORAL | Status: DC

## 2012-07-15 MED ORDER — ONDANSETRON HCL 4 MG/2ML IJ SOLN: 4.0000 mg | Freq: Four times a day (QID) | INTRAMUSCULAR | Status: DC | PRN

## 2012-07-15 MED ORDER — ALBUTEROL SULFATE (5 MG/ML) 0.5% IN NEBU
INHALATION_SOLUTION | RESPIRATORY_TRACT | Status: AC
  Administered 2012-07-15: 2.5 mg
  Filled 2012-07-15: qty 0.5

## 2012-07-15 MED ORDER — ALBUTEROL SULFATE (2.5 MG/3ML) 0.083% IN NEBU: 2.5000 mg | INHALATION_SOLUTION | Freq: Four times a day (QID) | RESPIRATORY_TRACT | Status: DC

## 2012-07-15 MED ORDER — ASPIRIN EC 81 MG PO TBEC: 81.0000 mg | DELAYED_RELEASE_TABLET | Freq: Every day | ORAL | Status: DC

## 2012-07-15 MED ORDER — CARVEDILOL 6.25 MG PO TABS
6.2500 mg | ORAL_TABLET | Freq: Two times a day (BID) | ORAL | Status: DC
  Filled 2012-07-15 (×2): qty 1

## 2012-07-15 MED ORDER — ASPIRIN EC 81 MG PO TBEC
81.0000 mg | DELAYED_RELEASE_TABLET | Freq: Every day | ORAL | Status: DC
  Administered 2012-07-16 – 2012-07-22 (×7): 81 mg via ORAL
  Filled 2012-07-15 (×9): qty 1

## 2012-07-15 NOTE — H&P (View-Only) (Signed)
HPI: [name] is a 61 year old Caucasian male patient with no prior cardiac history who was seen in our office last week after evaluation in the emergency room on 07/04/2012 with complaints of lower extremity edema. The patient has a long-standing history of back all abuse, heavy whiskey use of an tell the last 3 months. He has recently stopped smoking approximately one week ago. He states he has not had any alcohol for the last 2 weeks. 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 He was started on Coreg 3.125 mg twice a day, and his Lasix was increased to 40 mg daily with 20 mg of potassium.    Echocardiogram which was completed on 07/06/2012 revealed severely reduced LV function with an EF of 15%. The LV size with mildly to moderately dilated with very mild hypertrophy of the septum. Only the  inferiolateral base contracted normally. Other segments were severely hypokinetic to akinetic. As a result of this the patient's medications were again adjusted with increase in his Coreg to 6.25 mg twice a day, addition a spinal lactone 25 mg daily, and lisinopril 5 mg daily. Nitrates and hydralazine were not added at that time secondary to soft blood pressure. He is here today to discuss results of the echo, and for further recommendations. He has not had any further complaints of shortness of breath or lower extremity edema.  No Known Allergies  Current Outpatient Prescriptions  Medication Sig Dispense Refill  . albuterol (PROVENTIL HFA;VENTOLIN HFA) 108 (90 BASE) MCG/ACT inhaler Inhale 2 puffs into the lungs as needed.      Marland Kitchen albuterol (PROVENTIL) (2.5 MG/3ML) 0.083% nebulizer solution Take 2.5 mg by nebulization daily as needed.      Marland Kitchen aspirin 81 MG tablet Take 81 mg by mouth daily.      . carvedilol (COREG) 3.125 MG tablet Take 2 tablets (6.25 mg total) by mouth 2 (two) times daily with a meal.  60 tablet  11  . fish oil-omega-3 fatty  acids 1000 MG capsule Take 1.2 g by mouth daily.      . furosemide (LASIX) 40 MG tablet Take 1 tablet (40 mg total) by mouth daily.  30 tablet  11  . lisinopril (PRINIVIL,ZESTRIL) 5 MG tablet Take 1 tablet (5 mg total) by mouth daily.  90 tablet  3  . Multiple Vitamin (MULTIVITAMIN) tablet Take 1 tablet by mouth daily.      . potassium chloride SA (K-DUR,KLOR-CON) 20 MEQ tablet Take 1 tablet (20 mEq total) by mouth daily.  30 tablet  11  . spironolactone (ALDACTONE) 25 MG tablet Take 1 tablet (25 mg total) by mouth daily.  90 tablet  3    Past Medical History  Diagnosis Date  . COPD (chronic obstructive pulmonary disease)     ROS: PHYSICAL EXAM BP 90/70  Pulse 80  Wt 177 lb (80.287 kg)  SpO2 98%  General: Well developed, well nourished, in no acute distress Head: Eyes PERRLA, No xanthomas.   Normal cephalic and atramatic  Lungs: Clear bilaterally to auscultation and percussion. Heart: HRRR S1 S2, distant heart sounds. .  Pulses are 2+ & equal.            No carotid bruit. No JVD.  No abdominal bruits. No femoral bruits. Abdomen: Bowel sounds are positive, abdomen soft and non-tender without masses or                  Hernia's  noted. Msk:  Back normal, normal gait. Normal strength and tone for age. Extremities: No clubbing, cyanosis, non-pitting edema.  DP +1 Neuro: Alert and oriented X 3. Psych:  Good affect, responds appropriately    ASSESSMENT AND PLAN

## 2012-07-15 NOTE — Progress Notes (Signed)
Called by nursing re: blood pressure meds due now in setting of current BP 108/75, but pt may run chronically on lower side. He has Lisinopril 5mg , Spironolactone 25mg , Coreg 6.25mg  (increased dose from home), and IV Lasix 80mg  ordered for now. As he already took his lisinopril today, next dose due in AM. Will give spironolactone and IV Lasix now. Will reduce dose of Coreg back to home dose and push back timing to give it at 2200 depending on if his pressure holds up with IV diuresis. Coreg can be increased again in AM if his pressure is okay. Rana Hochstein PA-C

## 2012-07-15 NOTE — OR Nursing (Signed)
Transported to 317 via stretcher, on monitor

## 2012-07-15 NOTE — OR Nursing (Signed)
Report called to Huntley Dec, RN on 3000

## 2012-07-15 NOTE — Consult Note (Signed)
CARDIOTHORACIC SURGERY CONSULTATION REPORT  PCP is No primary provider on file. Referring Provider is Ladon Applebaum, MD   Reason for consultation:  3-vessel CAD  HPI:  Patient is a 61 year old male from [place], [place] with no previous cardiac history but risk factors including history of long-standing tobacco and alcohol abuse as well as a strong family history of coronary artery disease. The patient describes a slow gradual progression of symptoms of shortness of breath and fatigue. Initially the patient noted that he simply get tired easily and then he began to develop dyspnea on exertion. Symptoms progressed substantially over the last few months to the point where he began to get short of breath with minimal exertion and occasionally at rest. The patient also began to develop PND, orthopnea, and bilateral lower extremity edema. He states that intermittently in the past he said fleeting episodes of mild substernal chest discomfort and occasional symptoms in one or both arms, but noninvasive been very severe nor persistent. Ultimately he presented to the emergency room at [facility] with the primary complaint of lower extremity edema.  He was noted to have small left pleural effusion as well as elevated probe ENP level. Chest x-ray demonstrated cardiomegaly with central venous congestion without frank edema. He was started on oral diuretic and nebulized bronchodilator therapy, and he was seen in followup at the [facility] office in [place].  He was started on Coreg and his Lasix dose was increased.  An echocardiogram was performed 07/06/2012 which demonstrated severe left ventricular dysfunction with ejection fraction estimated at 15%.  Patient's medications were further adjusted and he was brought in for elective left and right heart catheterization earlier today by Dr. Sanjuana Kava. This reveals severe three-vessel coronary artery disease with severe left  ventricular dysfunction and moderate pulmonary hypertension. Cardiothoracic surgical consultation has been requested.  Past Medical History  Diagnosis Date  . COPD (chronic obstructive pulmonary disease)   . Congestive heart failure 07/15/2012  . Coronary artery disease 07/15/2012    No past surgical history on file.  Family History  Problem Relation Age of Onset  . Heart disease Mother   . Heart attack Mother   . Heart failure Mother   . Heart attack Sister   . Heart disease Sister     Social History History  Substance Use Topics  . Smoking status: Current Everyday Smoker -- 1.5 packs/day for 46 years  . Smokeless tobacco: Not on file  . Alcohol Use: 50.4 oz/week    84 Cans of beer per week     occ    Prior to Admission medications   Medication Sig Start Date End Date Taking? Authorizing Provider  albuterol (PROVENTIL HFA;VENTOLIN HFA) 108 (90 BASE) MCG/ACT inhaler Inhale 2 puffs into the lungs as needed.    Historical Provider, MD  albuterol (PROVENTIL) (2.5 MG/3ML) 0.083% nebulizer solution Take 2.5 mg by nebulization daily as needed.    Historical Provider, MD  aspirin 81 MG tablet Take 81 mg by mouth daily.    Historical Provider, MD  carvedilol (COREG) 3.125 MG tablet Take 2 tablets (6.25 mg total) by mouth 2 (two) times daily with a meal. 07/09/12 07/09/13  Kathlen Brunswick, MD  fish oil-omega-3 fatty acids 1000 MG capsule Take 1.2 g by mouth daily.    Historical Provider, MD  furosemide (LASIX) 40 MG tablet Take 1 tablet (40 mg total) by mouth daily. 07/06/12 07/06/13  Jodelle Gross, NP  lisinopril (PRINIVIL,ZESTRIL) 5 MG tablet Take 1 tablet (5 mg total) by mouth daily. 07/09/12 07/09/13  Kathlen Brunswick, MD  Multiple Vitamin (MULTIVITAMIN) tablet Take 1 tablet by mouth daily.    Historical Provider, MD  potassium chloride SA (K-DUR,KLOR-CON) 20 MEQ tablet Take 1 tablet (20 mEq total) by mouth daily. 07/06/12 07/06/13  Jodelle Gross, NP  spironolactone (ALDACTONE)  25 MG tablet Take 1 tablet (25 mg total) by mouth daily. 07/09/12 07/09/13  Kathlen Brunswick, MD    Current Facility-Administered Medications  Medication Dose Route Frequency Provider Last Rate Last Dose  . 0.9 %  sodium chloride infusion  250 mL Intravenous PRN Kathleene Hazel, MD      . acetaminophen (TYLENOL) tablet 650 mg  650 mg Oral Q4H PRN Kathleene Hazel, MD      . albuterol (PROVENTIL HFA;VENTOLIN HFA) 108 (90 BASE) MCG/ACT inhaler 2 puff  2 puff Inhalation Q6H Kathleene Hazel, MD      . albuterol (PROVENTIL) (5 MG/ML) 0.5% nebulizer solution 2.5 mg  2.5 mg Nebulization Q6H PRN Kathleene Hazel, MD      . aspirin tablet 81 mg  81 mg Oral Daily Kathleene Hazel, MD      . carvedilol (COREG) tablet 6.25 mg  6.25 mg Oral BID WC Kathleene Hazel, MD      . furosemide (LASIX) injection 80 mg  80 mg Intravenous BID Kathleene Hazel, MD      . lisinopril (PRINIVIL,ZESTRIL) tablet 5 mg  5 mg Oral Daily Kathleene Hazel, MD      . nitroGLYCERIN (NITROSTAT) SL tablet 0.4 mg  0.4 mg Sublingual Q5 Min x 3 PRN Kathleene Hazel, MD      . ondansetron [employer]) injection 4 mg  4 mg Intravenous Q6H PRN Kathleene Hazel, MD      . sodium chloride 0.9 % injection 3 mL  3 mL Intravenous Q12H Kathleene Hazel, MD      . sodium chloride 0.9 % injection 3 mL  3 mL Intravenous PRN Kathleene Hazel, MD      . spironolactone (ALDACTONE) tablet 25 mg  25 mg Oral Daily Kathleene Hazel, MD       Facility-Administered Medications Ordered in Other Encounters  Medication Dose Route Frequency Provider Last Rate Last Dose  . aspirin chewable tablet 324 mg  324 mg Oral Pre-Cath Jodelle Gross, NP   324 mg at 07/15/12 1045  . diazepam (VALIUM) tablet 5 mg  5 mg Oral On Call Jodelle Gross, NP   5 mg at 07/15/12 1045  . DISCONTD: 0.9 %  sodium chloride infusion  250 mL Intravenous PRN Jodelle Gross, NP      . DISCONTD: 0.9 %   sodium chloride infusion   Intravenous Continuous Jodelle Gross, NP 20 mL/hr at 07/15/12 1100    . DISCONTD: 0.9 %  sodium chloride infusion   Intravenous Continuous Kathleene Hazel, MD      . DISCONTD: acetaminophen (TYLENOL) tablet 650 mg  650 mg Oral Q4H PRN Kathleene Hazel, MD      . DISCONTD: albuterol (PROVENTIL HFA;VENTOLIN HFA) 108 (90 BASE) MCG/ACT inhaler 2 puff  2 puff Inhalation Q4H Kathleene Hazel, MD      . DISCONTD: albuterol (PROVENTIL) (2.5 MG/3ML) 0.083% nebulizer solution 2.5 mg  2.5 mg Nebulization Q6H Kathleene Hazel, MD      . DISCONTD: aspirin chewable tablet 324 mg  324 mg Oral NOW Kathleene Hazel, MD      . DISCONTD: aspirin EC tablet 81 mg  81 mg Oral Daily Kathleene Hazel, MD      . DISCONTD: aspirin suppository 300 mg  300 mg Rectal NOW Kathleene Hazel, MD      . DISCONTD: aspirin tablet 81 mg  81 mg Oral Daily Kathleene Hazel, MD      . DISCONTD: carvedilol (COREG) tablet 6.25 mg  6.25 mg Oral BID WC Kathleene Hazel, MD      . DISCONTD: furosemide (LASIX) injection 80 mg  80 mg Intravenous BID Kathleene Hazel, MD      . DISCONTD: lisinopril (PRINIVIL,ZESTRIL) tablet 5 mg  5 mg Oral Daily Kathleene Hazel, MD      . DISCONTD: nitroGLYCERIN (NITROSTAT) SL tablet 0.4 mg  0.4 mg Sublingual Q5 Min x 3 PRN Kathleene Hazel, MD      . DISCONTD: sodium chloride 0.9 % injection 3 mL  3 mL Intravenous Q12H Jodelle Gross, NP      . DISCONTD: sodium chloride 0.9 % injection 3 mL  3 mL Intravenous PRN Jodelle Gross, NP      . DISCONTD: spironolactone (ALDACTONE) tablet 25 mg  25 mg Oral Daily Kathleene Hazel, MD        Not on File    Review of Systems:   General:  stable appetite, decreased energy, 2 lb. weight gain, no weight loss, no fever  Cardiac:  no chest pain with exertion, no chest pain at rest, + SOB with mild exertion, + occasional resting SOB, + PND, + orthopnea,  no palpitations, no arrhythmia, no atrial fibrillation, + LE edema, no dizzy spells, no syncope  Respiratory:  + shortness of breath, no home oxygen, + long-standing productive cough, no dry cough, + intermittent bronchitis, + wheezing, no hemoptysis, no asthma, no pain with inspiration or cough, no sleep apnea, no CPAP at night  GI:   no difficulty swallowing, no reflux, no frequent heartburn, no hiatal hernia, no abdominal pain, no constipation, no diarrhea, no hematochezia, no hematemesis, no melena  GU:   no dysuria,  + frequency, no urinary tract infection, no hematuria, no known enlarged prostate, no kidney stones, no kidney disease  Vascular:  no pain suggestive of claudication, no pain in feet, no leg cramps, no varicose veins, no DVT, no non-healing foot ulcer  Neuro:   no stroke, no TIA's, no seizures, no headaches, no temporary blindness one eye,  no slurred speech, no peripheral neuropathy, no chronic pain, no instability of gait, no memory/cognitive dysfunction  Musculoskeletal: + arthritis, no joint swelling, no myalgias, no difficulty walking, normal mobility   Skin:   no rash, no itching, no skin infections, no pressure sores or ulcerations  Psych:   no anxiety, no depression, no nervousness, no unusual recent stress  Eyes:   no blurry vision, no floaters, no recent vision changes, does not wears glasses or contacts  ENT:   no hearing loss, + loose or painful teeth, no dentures, last saw dentist remote past  Hematologic:  no easy bruising, no abnormal bleeding, noclotting disorder, no frequent epistaxis  Endocrine:  No known diabetes, does not check CBG's at home       Physical Exam:   There were no vitals taken for this visit.  General:  chronically ill-appearing  HEENT:  Unremarkable   Neck:   no JVD, no bruits, no adenopathy  Chest:   clear to auscultation, symmetrical breath sounds, no wheezes, no rhonchi   CV:   RRR, no murmur   Abdomen:  soft, non-tender, no masses    Extremities:  warm, well-perfused, pulses diminished, + mild LE edema  Rectal/GU  Deferred  Neuro:   Grossly non-focal and symmetrical throughout  Skin:   Clean and dry, no rashes, no breakdown  Diagnostic Tests:  Transthoracic Echocardiography  Patient: Sylvanus, Telford MR #: [id number] Study Date: 07/06/2012 Gender: M Age: 6 Height: 182.9cm Weight: 79.4kg BSA: 2.6m^2 Pt. Status: Room:  SONOGRAPHER Karrie Doffing ATTENDING Golda Acre REFERRING Joni Reining PERFORMING Delma Freeze Penn cc:  ------------------------------------------------------------ LV EF: 15%  ------------------------------------------------------------ Indications: CHF - 428.0.  ------------------------------------------------------------ History: PMH: Chronic obstructive pulmonary disease. PMH: ETOH abuse, Cardiomyopathy Risk factors: Current tobacco use.  ------------------------------------------------------------ Study Conclusions  - Left ventricle: The cavity size was mildly to moderately dilated. Very mildhypertrophy of the septum. Systolic function was severely reduced. The estimated ejection fraction was 15%. Only the inferolateral base contracted normally. Other segments were severely hypokinetic to akinetic. - Mitral valve: Moderately calcified annulus. Mildly thickened leaflets . Mild to moderate regurgitation. - Left atrium: The atrium was moderately to severely dilated. - Right atrium: The atrium was moderately dilated. - Atrial septum: No defect or patent foramen ovale was identified. - Tricuspid valve: Mild to at worst moderate regurgitation. - Pulmonary arteries: Systolic pressure was mildly increased. PA peak pressure: 44mm Hg (S). - Pericardium, extracardiac: A trivial pericardial effusion was identified posterior to the heart. Transthoracic echocardiography. M-mode, complete 2D, spectral Doppler, and color Doppler. Height:  Height: 182.9cm. Height: 72in. Weight: Weight: 79.4kg. Weight: 174.6lb. Body mass index: BMI: 23.7kg/m^2. Body surface area: BSA: 2.58m^2. Patient status: Outpatient. Location: Echo laboratory.  ------------------------------------------------------------  ------------------------------------------------------------ Left ventricle: The cavity size was mildly to moderately dilated. Very mildhypertrophy of the septum. Systolic function was severely reduced. The estimated ejection fraction was 15%. Only the inferolateral base contracted normally. Other segments were severely hypokinetic to akinetic.  ------------------------------------------------------------ Aortic valve: Structurally normal valve. Trileaflet. Cusp separation was normal. Doppler: Transvalvular velocity was within the normal range. There was no stenosis. No regurgitation.  ------------------------------------------------------------ Aorta: Aortic root: The aortic root was normal in size. Ascending aorta: The ascending aorta was normal in size.  ------------------------------------------------------------ Mitral valve: Moderately calcified annulus. Mildly thickened leaflets . Doppler: Mild to moderate regurgitation. Peak gradient: 7mm Hg (D).  ------------------------------------------------------------ Left atrium: The atrium was moderately to severely dilated.  ------------------------------------------------------------ Atrial septum: No defect or patent foramen ovale was identified.  ------------------------------------------------------------ Right ventricle: The cavity size was normal. Wall thickness was normal. Systolic function was normal.  ------------------------------------------------------------ Pulmonic valve: Structurally normal valve. Cusp separation was normal. Doppler: Transvalvular velocity was within the normal range. No  regurgitation.  ------------------------------------------------------------ Tricuspid valve: Structurally normal valve. Leaflet separation was normal. Doppler: Transvalvular velocity was within the normal range. Mild to at worst moderate regurgitation.  ------------------------------------------------------------ Pulmonary artery: The main pulmonary artery was normal-sized. Systolic pressure was mildly increased.  ------------------------------------------------------------ Right atrium: The atrium was moderately dilated.  ------------------------------------------------------------ Pericardium: A trivial pericardial effusion was identified posterior to the heart.  ------------------------------------------------------------ Systemic veins: Inferior vena cava: The vessel was moderately dilated; the respirophasic diameter changes were blunted (< 50%); findings are consistent with elevated central venous pressure.  ------------------------------------------------------------  2D measurements Normal Doppler measurements Normal Left ventricle Main pulmonary LVID ED, 64.6 mm 43-52 artery chord, Pressure, S 44 mm =30 PLAX Hg LVID ES, 59.8 mm 23-38 Mitral valve  chord, Peak E vel 136 cm/s ------ PLAX Peak A vel 26. cm/s ------ FS, chord, 7 % >29 7 PLAX Deceleration 194 ms 150-23 LVPW, ED 12.4 mm ------ time 0 IVS/LVPW 0.87 <1.3 Peak 7 mm ------ ratio, ED gradient, D Hg Ventricular septum Peak E/A 5.1 ------ IVS, ED 10.8 mm ------ ratio Aorta Regurg alias 38. cm/s ------ Root diam, 35 mm ------ vel, PISA 5 ED Max regurg 490 cm/s ------ Left atrium vel AP dim 51 mm ------ Regurg VTI 140 cm ------ AP dim 2.54 cm/m^2 <2.2 ERO, PISA 0.0 cm^2 ------ index 8 Right ventricle Regurg vol, 11 ml ------ RVID ED, 40.4 mm 19-38 PISA PLAX Tricuspid valve Regurg peak 290 cm/s ------ M-mode measurements Normal vel Aorta Peak RV-RA 34 mm ------ Root diam, 30 mm 20-37 gradient, S Hg ED  Systemic veins Left atrium Estimated CVP 10 mm ------ AP dim, ES 61 mm 19-40 Hg AP dim 3.03 cm/m^2 <2.2 Right ventricle index, ES Pressure, S 44 mm <30 LA/Ao root 2.03 ------ Hg ratio  ------------------------------------------------------------ Prepared and Electronically Authenticated by  [place] Bing 2013-08-15T17:[id number]  Cardiac Catheterization Operative Report  OZZIE KNOBEL  [id number]  8/22/20131:11 PM  No primary provider on file.  Procedure Performed:  1. Left Heart Catheterization 2. Selective Coronary Angiography 3. Right Heart Catheterization 4. Left ventricular pressures Operator: Verne Carrow, MD  Indication: 61 yo WM with history of tobacco abuse, COPD, new diagnosis of cardiomyopathy and acute on chronic systolic CHF. Right and left heart cath arranged to exclude CAD and assess filling pressures.  Procedure Details:  The risks, benefits, complications, treatment options, and expected outcomes were discussed with the patient. The patient and/or family concurred with the proposed plan, giving informed consent. The patient was brought to the outpt cath lab after IV hydration was begun and oral premedication was given. The patient was further sedated with Versed and Fentanyl. The right groin was prepped and draped in the usual manner. Using the modified Seldinger access technique, a 4 French sheath was placed in the femoral artery. A 6 French sheath was inserted into the right femoral vein. A multi-purpose catheter was used to perform a right heart catheterization. A JL-5 catheter was used to engage the left main artery. A 3DRC catheter was used to engage the RCA. A pigtail catheter was used to measure LV pressures. There were no immediate complications. The patient was taken to the recovery area in stable condition.  Hemodynamic Findings:  Ao: 104/77  LV: 106/24/34  RA: 23  RV: 42/18/28  PA: 47/25 (mean 35)  PCWP: 27  Fick Cardiac Output: 4.0 L/min   Fick Cardiac Index: 2.0 L/min/m2  Central Aortic Saturation: 96%  Pulmonary Artery Saturation: 61%  Angiographic Findings:  Left main: 20% distal stenosis.  Left Anterior Descending Artery: Large caliber vessel that courses to the apex. The mid vessel has an ulcerated, 95% stenosis just after the takeoff of the diagonal branch. The distal vessel has diffuse plaque but no focally obstructive lesions. The first diagonal is moderate sized and has 99% mid stenosis. The second diagonal is moderate sized and has mild plaque disease.  Circumflex Artery: Moderate sized vessel with 50% proximal stenosis, 90% mid stenosis. The intermediate branch is moderate sized with no obstructive disease noted. The first OM branch is small to moderate sized with ostial 80% stenosis.  Right Coronary Artery: Large dominant vessel with diffuse 80% stenosis in the mid vessel. There are serial 40-50 lesions in the distal vessel. The PLA and  PDA are moderate sized and patent with diffuse mild plaque.  Left Ventricular Angiogram: Deferred.  Impression:  1. Triple vessel CAD  2. Severe LV systolic dysfunction  3. Acute on chronic systolic CHF  4. Ongoing tobacco abuse  Recommendations: Will admit to telemetry for diuresis with IV Lasix tonight given elevated filling pressures. Will consult CT surgery for CABG with mult-vessel CAD.  Complications: None; patient tolerated the procedure well.  I've reviewed the patient's recent transthoracic echocardiogram performed 07/06/2012. The patient has profound global left ventricular dysfunction with ejection fraction less than or equal to 15%. There is at least moderate functional mitral regurgitation and at least moderate functional tricuspid regurgitation (type I dysfunction).  I've also reviewed the patient's cardiac catheterization performed earlier today. The patient has three-vessel coronary artery disease with severe left ventricular dysfunction and moderate pulmonary attention.  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   Impression:  The patient has severe three-vessel coronary artery disease with quite profound left ventricular dysfunction that likely represents a combination of ischemic and nonischemic heart myopathy. The patient presents with gradual progression of symptoms of congestive heart failure, recently class III to class IV. Recent transthoracic echocardiogram is also notable for the presence of significant functional mitral and tricuspid regurgitation.  The patient has long-standing history of heavy alcohol and tobacco abuse with likely underlying severe chronic obstructive pulmonary disease.  I agree that the patient would benefit from elective coronary artery bypass grafting, but risks associated with surgical intervention will be quite high. The patient may need concomitant mitral and or tricuspid ring annuloplasty. Preoperative transesophageal echocardiogram might be helpful, although one will certainly be planned at the time of surgery.  The patient has very poor dentition and may need dental extraction preoperatively.   Plan:  I have outlined the indications, risks, and potential benefits of high-risk coronary artery bypass grafting with the patient and his wife this afternoon. Possible need for concomitant mitral and/or tricuspid ring annuloplasty were discussed. The very high-risk nature surgical intervention has been discussed including the possibility of need for temporary mechanical circulatory support if the patient does not do well. We will plan to get a dental service consult for possible dental extraction preoperatively the patient will need his congestive heart failure an underlying chronic  obstructive pulmonary disease tuned up considerably prior to proceeding with surgery. All of his questions been addressed.    Salvatore Decent. Cornelius Moras, MD 07/15/2012 4:41 PM

## 2012-07-15 NOTE — Progress Notes (Signed)
Dr Alyson Ingles in to talk to pt about results of test and plan of care.  Tolerated fluid intake well.

## 2012-07-15 NOTE — CV Procedure (Signed)
    Cardiac Catheterization Operative Report  Miguel York [id number] 8/22/20131:11 PM No primary provider on file.  Procedure Performed:  1. Left Heart Catheterization 2. Selective Coronary Angiography 3. Right Heart Catheterization 4. Left ventricular pressures  Operator: Verne Carrow, MD  Indication:  61 yo WM with history of tobacco abuse, COPD, new diagnosis of cardiomyopathy and acute on chronic systolic CHF. Right and left heart cath arranged to exclude CAD and assess filling pressures.                               Procedure Details: The risks, benefits, complications, treatment options, and expected outcomes were discussed with the patient. The patient and/or family concurred with the proposed plan, giving informed consent. The patient was brought to the outpt cath lab after IV hydration was begun and oral premedication was given. The patient was further sedated with Versed and Fentanyl. The right groin was prepped and draped in the usual manner. Using the modified Seldinger access technique, a 4 French sheath was placed in the femoral artery. A 6 French sheath was inserted into the right femoral vein. A multi-purpose catheter was used to perform a right heart catheterization. A JL-5 catheter was used to engage the left main artery. A 3DRC catheter was used to engage the RCA.  A pigtail catheter was used to measure LV pressures. There were no immediate complications. The patient was taken to the recovery area in stable condition.   Hemodynamic Findings: Ao:  104/77              LV: 106/24/34 RA:  23            RV: 42/18/28 PA:   47/25 (mean 35)      PCWP:  27 Fick Cardiac Output: 4.0 L/min Fick Cardiac Index: 2.0 L/min/m2 Central Aortic Saturation: 96% Pulmonary Artery Saturation: 61%   Angiographic Findings:  Left main: 20% distal stenosis.   Left Anterior Descending Artery: Large caliber vessel that courses to the apex. The mid vessel has an ulcerated,  95% stenosis just after the takeoff of the diagonal branch. The distal vessel has diffuse plaque but no focally obstructive lesions. The first diagonal is moderate sized and has 99% mid stenosis. The second diagonal is moderate sized and has mild plaque disease.   Circumflex Artery: Moderate sized vessel with 50% proximal stenosis, 90% mid stenosis. The intermediate branch is moderate sized with no obstructive disease noted. The first OM branch is small to moderate sized with ostial 80% stenosis.   Right Coronary Artery: Large dominant vessel with diffuse 80% stenosis in the mid vessel. There are serial 40-50 lesions in the distal vessel. The PLA and PDA are moderate sized and patent with diffuse mild plaque.   Left Ventricular Angiogram: Deferred.   Impression: 1. Triple vessel CAD 2. Severe LV systolic dysfunction 3. Acute on chronic systolic CHF 4. Ongoing tobacco abuse  Recommendations: Will admit to telemetry for diuresis with IV Lasix tonight given elevated filling pressures. Will consult CT surgery for CABG with mult-vessel CAD.        Complications:  None; patient tolerated the procedure well.

## 2012-07-15 NOTE — Progress Notes (Signed)
O2 started at 2l  Liters for O2 sat of 88

## 2012-07-15 NOTE — Progress Notes (Signed)
Pt ambulated 500 ft on unit after BR. Pt had mild SOB that he states was "not that bad."  Changed dsg on femoral site to bandaid. Site was CDI with no drainage.  Will continue to monitor.  Miguel York

## 2012-07-15 NOTE — Interval H&P Note (Signed)
History and Physical Interval Note:  07/15/2012 12:18 PM  Miguel York  has presented today for surgery, with the diagnosis of low ejection fraction  The various methods of treatment have been discussed with the patient and family. After consideration of risks, benefits and other options for treatment, the patient has consented to  Procedure(s) (LRB): JV LEFT HEART CATHETERIZATION WITH CORONARY ANGIOGRAM (N/A) as a surgical intervention .  The patient's history has been reviewed, patient examined, no change in status, stable for surgery.  I have reviewed the patient's chart and labs.  Questions were answered to the patient's satisfaction.     Reisa Coppola

## 2012-07-16 ENCOUNTER — Inpatient Hospital Stay (HOSPITAL_COMMUNITY): Payer: BC Managed Care – PPO

## 2012-07-16 ENCOUNTER — Encounter (HOSPITAL_COMMUNITY): Payer: Self-pay | Admitting: Dentistry

## 2012-07-16 DIAGNOSIS — I428 Other cardiomyopathies: Secondary | ICD-10-CM

## 2012-07-16 DIAGNOSIS — IMO0002 Reserved for concepts with insufficient information to code with codable children: Secondary | ICD-10-CM

## 2012-07-16 DIAGNOSIS — K036 Deposits [accretions] on teeth: Secondary | ICD-10-CM

## 2012-07-16 DIAGNOSIS — K0530 Chronic periodontitis, unspecified: Secondary | ICD-10-CM

## 2012-07-16 DIAGNOSIS — I2510 Atherosclerotic heart disease of native coronary artery without angina pectoris: Principal | ICD-10-CM

## 2012-07-16 DIAGNOSIS — I369 Nonrheumatic tricuspid valve disorder, unspecified: Secondary | ICD-10-CM

## 2012-07-16 DIAGNOSIS — F17200 Nicotine dependence, unspecified, uncomplicated: Secondary | ICD-10-CM

## 2012-07-16 DIAGNOSIS — J449 Chronic obstructive pulmonary disease, unspecified: Secondary | ICD-10-CM

## 2012-07-16 DIAGNOSIS — I509 Heart failure, unspecified: Secondary | ICD-10-CM

## 2012-07-16 DIAGNOSIS — K083 Retained dental root: Secondary | ICD-10-CM

## 2012-07-16 LAB — URINALYSIS, ROUTINE W REFLEX MICROSCOPIC
Glucose, UA: NEGATIVE mg/dL
Hgb urine dipstick: NEGATIVE
Ketones, ur: NEGATIVE mg/dL
Leukocytes, UA: NEGATIVE
Protein, ur: NEGATIVE mg/dL
pH: 6 (ref 5.0–8.0)

## 2012-07-16 LAB — BLOOD GAS, ARTERIAL
Bicarbonate: 26.2 mEq/L — ABNORMAL HIGH (ref 20.0–24.0)
Drawn by: 35849
FIO2: 0.21 %
O2 Saturation: 96.5 %
Patient temperature: 98.6
pH, Arterial: 7.456 — ABNORMAL HIGH (ref 7.350–7.450)

## 2012-07-16 LAB — COMPREHENSIVE METABOLIC PANEL
AST: 150 U/L — ABNORMAL HIGH (ref 0–37)
Albumin: 3 g/dL — ABNORMAL LOW (ref 3.5–5.2)
BUN: 17 mg/dL (ref 6–23)
Calcium: 9.1 mg/dL (ref 8.4–10.5)
Chloride: 99 mEq/L (ref 96–112)
Creatinine, Ser: 1.02 mg/dL (ref 0.50–1.35)
Total Bilirubin: 0.9 mg/dL (ref 0.3–1.2)
Total Protein: 8.1 g/dL (ref 6.0–8.3)

## 2012-07-16 LAB — TSH: TSH: 5.151 u[IU]/mL — ABNORMAL HIGH (ref 0.350–4.500)

## 2012-07-16 LAB — PULMONARY FUNCTION TEST

## 2012-07-16 LAB — LIPID PANEL
HDL: 29 mg/dL — ABNORMAL LOW (ref >39)
LDL Cholesterol: 90 mg/dL (ref 0–99)
Total CHOL/HDL Ratio: 4.5 RATIO

## 2012-07-16 LAB — CBC
MCV: 95.7 fL (ref 78.0–100.0)
Platelets: 109 10*3/uL — ABNORMAL LOW (ref 150–400)
RBC: 4.15 MIL/uL — ABNORMAL LOW (ref 4.22–5.81)
RDW: 14.8 % (ref 11.5–15.5)
WBC: 5.6 10*3/uL (ref 4.0–10.5)

## 2012-07-16 LAB — PROTIME-INR: INR: 1.32 (ref 0.00–1.49)

## 2012-07-16 LAB — HEMOGLOBIN A1C
Hgb A1c MFr Bld: 5.7 % — ABNORMAL HIGH (ref <5.7)
Mean Plasma Glucose: 117 mg/dL — ABNORMAL HIGH (ref <117)

## 2012-07-16 LAB — TYPE AND SCREEN: ABO/RH(D): A POS

## 2012-07-16 LAB — PREALBUMIN: Prealbumin: 11.5 mg/dL — ABNORMAL LOW (ref 17.0–34.0)

## 2012-07-16 LAB — ABO/RH: ABO/RH(D): A POS

## 2012-07-16 NOTE — Progress Notes (Signed)
  Echocardiogram 2D Echocardiogram has been performed.  Georgian Co 07/16/2012, 10:40 AM

## 2012-07-16 NOTE — Consult Note (Signed)
DENTAL CONSULTATION  Date of Consultation:  07/16/2012 Patient Name:   ROHEN KIMES Date of Birth:   29-Apr-1951 Medical Record Number: [id number]  VITALS: BP 110/71  Pulse 81  Temp 98.3 F (36.8 C) (Oral)  Resp 18  SpO2 96%   CHIEF COMPLAINT: Patient referred for a pre-heart surgery dental protocol evaluation  HPI: JAYDIEN PANEPINTO is a 61 year old male recently diagnosed with 3 vessel coronary artery disease and severe cardiomyopathy. Patient referred for a pre-heart surgery dental protocol evaluation to rule out dental infection that may affect the patient's systemic health and anticipated heart surgery.  Patient currently denies acute toothache, swellings, or abscesses. The patient knows that he has" really bad teeth and". Patient knows that they" all need to come out".  Patient denies having any partial dentures. Patient has not seen a dentist for over 30 years. The patient indicates that he pulled his own teeth as needed.  PMH: Past Medical History  Diagnosis Date  . COPD (chronic obstructive pulmonary disease)   . Congestive heart failure 07/15/2012  . Coronary artery disease 07/15/2012    PSH: No past surgical history on file.  ALLERGIES: No Known Allergies  MEDICATIONS: Current Facility-Administered Medications  Medication Dose Route Frequency Provider Last Rate Last Dose  . 0.9 %  sodium chloride infusion  250 mL Intravenous PRN Kathleene Hazel, MD      . acetaminophen (TYLENOL) tablet 650 mg  650 mg Oral Q4H PRN Kathleene Hazel, MD      . albuterol (PROVENTIL HFA;VENTOLIN HFA) 108 (90 BASE) MCG/ACT inhaler 2 puff  2 puff Inhalation Q6H Kathleene Hazel, MD      . albuterol (PROVENTIL) (5 MG/ML) 0.5% nebulizer solution 2.5 mg  2.5 mg Nebulization Q6H PRN Kathleene Hazel, MD   2.5 mg at 07/16/12 0835  . albuterol (PROVENTIL) (5 MG/ML) 0.5% nebulizer solution        2.5 mg at 07/15/12 1656  . aspirin EC tablet 81 mg  81 mg Oral  Daily Kendra P Hiatt, PHARMD   81 mg at 07/16/12 1109  . carvedilol (COREG) tablet 3.125 mg  3.125 mg Oral BID WC Dayna N Dunn, PA   3.125 mg at 07/16/12 1109  . chlorhexidine (PERIDEX) 0.12 % solution 15 mL  15 mL Mouth/Throat BID Purcell Nails, MD   15 mL at 07/16/12 1108  . furosemide (LASIX) injection 80 mg  80 mg Intravenous BID Kathleene Hazel, MD   80 mg at 07/16/12 1108  . lisinopril (PRINIVIL,ZESTRIL) tablet 5 mg  5 mg Oral Daily Dayna N Dunn, PA   5 mg at 07/16/12 1109  . nitroGLYCERIN (NITROSTAT) SL tablet 0.4 mg  0.4 mg Sublingual Q5 Min x 3 PRN Kathleene Hazel, MD      . ondansetron [facility]) injection 4 mg  4 mg Intravenous Q6H PRN Kathleene Hazel, MD      . sodium chloride 0.9 % injection 3 mL  3 mL Intravenous Q12H Kathleene Hazel, MD   3 mL at 07/16/12 1109  . sodium chloride 0.9 % injection 3 mL  3 mL Intravenous PRN Kathleene Hazel, MD      . spironolactone (ALDACTONE) tablet 25 mg  25 mg Oral Daily Kathleene Hazel, MD   25 mg at 07/16/12 1108  . DISCONTD: aspirin tablet 81 mg  81 mg Oral Daily Kathleene Hazel, MD      . DISCONTD: carvedilol (COREG) tablet 6.25 mg  6.25 mg  Oral BID WC Kathleene Hazel, MD       Facility-Administered Medications Ordered in Other Encounters  Medication Dose Route Frequency Provider Last Rate Last Dose  . DISCONTD: 0.9 %  sodium chloride infusion   Intravenous Continuous Jodelle Gross, NP 20 mL/hr at 07/15/12 1100    . DISCONTD: 0.9 %  sodium chloride infusion   Intravenous Continuous Kathleene Hazel, MD      . DISCONTD: acetaminophen (TYLENOL) tablet 650 mg  650 mg Oral Q4H PRN Kathleene Hazel, MD      . DISCONTD: albuterol (PROVENTIL HFA;VENTOLIN HFA) 108 (90 BASE) MCG/ACT inhaler 2 puff  2 puff Inhalation Q4H Kathleene Hazel, MD      . DISCONTD: albuterol (PROVENTIL) (2.5 MG/3ML) 0.083% nebulizer solution 2.5 mg  2.5 mg Nebulization Q6H Kathleene Hazel, MD       . DISCONTD: aspirin chewable tablet 324 mg  324 mg Oral NOW Kathleene Hazel, MD      . DISCONTD: aspirin EC tablet 81 mg  81 mg Oral Daily Kathleene Hazel, MD      . DISCONTD: aspirin suppository 300 mg  300 mg Rectal NOW Kathleene Hazel, MD      . DISCONTD: aspirin tablet 81 mg  81 mg Oral Daily Kathleene Hazel, MD      . DISCONTD: carvedilol (COREG) tablet 6.25 mg  6.25 mg Oral BID WC Kathleene Hazel, MD      . DISCONTD: furosemide (LASIX) injection 80 mg  80 mg Intravenous BID Kathleene Hazel, MD      . DISCONTD: lisinopril (PRINIVIL,ZESTRIL) tablet 5 mg  5 mg Oral Daily Kathleene Hazel, MD      . DISCONTD: nitroGLYCERIN (NITROSTAT) SL tablet 0.4 mg  0.4 mg Sublingual Q5 Min x 3 PRN Kathleene Hazel, MD      . DISCONTD: spironolactone (ALDACTONE) tablet 25 mg  25 mg Oral Daily Kathleene Hazel, MD        LABS: Lab Results  Component Value Date   WBC 5.6 07/16/2012   HGB 13.3 07/16/2012   HCT 39.7 07/16/2012   MCV 95.7 07/16/2012   PLT 109* 07/16/2012      Component Value Date/Time   NA 134* 07/16/2012 0700   K 4.0 07/16/2012 0700   CL 99 07/16/2012 0700   CO2 25 07/16/2012 0700   GLUCOSE 120* 07/16/2012 0700   BUN 17 07/16/2012 0700   CREATININE 1.02 07/16/2012 0700   CREATININE 1.12 07/12/2012 0907   CALCIUM 9.1 07/16/2012 0700   GFRNONAA 79* 07/16/2012 0700   GFRAA >90 07/16/2012 0700   Lab Results  Component Value Date   INR 1.32 07/16/2012   No results found for this basename: PTT    SOCIAL HISTORY: History   Social History  . Marital Status: Single    Spouse Name: N/A    Number of Children: N/A  . Years of Education: N/A   Occupational History  . Not on file.   Social History Main Topics  . Smoking status: Current Everyday Smoker -- 1.5 packs/day for 46 years  . Smokeless tobacco: Not on file  . Alcohol Use: 50.4 oz/week    84 Cans of beer per week     occ  . Drug Use: No     former crack and marijuana   . Sexually Active: Yes   Other Topics Concern  . Not on file   Social History Narrative  . No narrative  on file    FAMILY HISTORY: Family History  Problem Relation Age of Onset  . Heart disease Mother   . Heart attack Mother   . Heart failure Mother   . Heart attack Sister   . Heart disease Sister      REVIEW OF SYSTEMS: Reviewed from chart for this admission.  DENTAL HISTORY: CHIEF COMPLAINT: Patient referred for a pre-heart surgery dental protocol evaluation  HPI: LEXX MONTE is a 61 year old male recently diagnosed with 3 vessel coronary artery disease and severe cardiomyopathy. Patient referred for a pre-heart surgery dental protocol evaluation to rule out dental infection that may affect the patient's systemic health and anticipated heart surgery.  Patient currently denies acute toothache, swellings, or abscesses. The patient knows that he has" really bad teeth and". Patient knows that they" all need to come out".  Patient denies having any partial dentures. Patient has not seen a dentist for over 30 years. The patient indicates that he pulled his own teeth as needed.  DENTAL EXAMINATION:  GENERAL: Patient is a well-developed, well-nourished male in no acute distress. HEAD AND NECK: There is no obvious submandibular lymphadenopathy. The patient denies acute TMJ symptoms. INTRAORAL EXAM: The patient has normal saliva. I do not see any evidence of abscess formation within the mouth at this time. DENTITION: Patient appears to have tooth numbers 1, 2, 3, 4, 5, 6, 7, 8, 9, 10, 11, 17, 19, 20, 21, 22, 27, 28, 29 remaining. PERIODONTAL: Patient has chronic, advanced periodontal disease with plaque and calculus accumulations, generalized gingival recession, and generalized tooth mobility with severe bone loss. DENTAL CARIES/SUBOPTIMAL RESTORATIONS: Multiple dental caries are noted within the mouth ENDODONTIC: Patient denies acute pulpitis symptoms. Patient does appear  to have several areas of periapical pathology. CROWN AND BRIDGE: There are no crown restorations. PROSTHODONTIC: There is no history of partial dentures OCCLUSION: Patient with a poor occlusal scheme secondary to multiple missing teeth, supra-eruption and drifting of the unopposed teeth into the edentulous areas and lack of replacement of missing teeth with dental prostheses.  RADIOGRAPHIC INTERPRETATION: A panoramic x-ray was obtained by radiology on 07/16/2012  There multiple missing teeth, there is moderate to severe bone loss. There is plaque and calculus accumulations, there appears to be some areas of periapical pathology and radiolucency. There is supra-eruption and drifting of the unopposed teeth into the edentulous areas.   ASSESSMENTS: 1. Chronic periodontitis of bone loss 2. Generalized gingival recession 3. Generalized tooth mobility 4. Plaque and calculus accumulations 5. Dental caries 6. Chronic apical periodontitis 7. Multiple missing teeth 8. Supra-eruption and drifting of the unopposed teeth into the edentulous areas 9. Malocclusion 10. Severe cardiovascular compromise with the risk for applications up to and including death with anticipated dental procedures in the operating room with general anesthesia.     PLAN/RECOMMENDATIONS: 1. I discussed the risks, benefits, and complications of various treatment options with the patient  am his life in relationship to  the patient's medical and dental conditions. We discussed various treatment options to include no treatment, multiple extractions with alveoloplasty, pre-prosthetic surgery as indicated, periodontal therapy, dental restorations, root canal therapy, crown and bridge therapy, implant therapy, and replacement of missing teeth as indicated. The patient currently wishes to proceed with extraction of remaining teeth with alveoloplasty and pre-prosthetic surgery as indicated in the operating room with general anesthesia on  Tuesday, 07/20/12 at 9 AM. Heart surgery will then take place with Dr. Cornelius Moras for his discretion based on the patient's condition and healing.  Patient is then followup for dentist of his choice for fabrication of upper lower complete dentures after adequate healing.   2. Discussion of findings with medical team and coordination of future medical and dental care.   Charlynne Pander, DDS

## 2012-07-16 NOTE — Progress Notes (Signed)
   CARDIOTHORACIC SURGERY PROGRESS NOTE   Subjective: No chest pain.  No SOB  Objective: Vital signs in last 24 hours: Temp:  [97.6 F (36.4 C)-98.6 F (37 C)] 98.3 F (36.8 C) (08/23 0500) Pulse Rate:  [74-92] 81  (08/23 1106) Cardiac Rhythm:  [-] Normal sinus rhythm;Heart block (08/23 1000) Resp:  [18-20] 18  (08/23 0500) BP: (107-127)/(51-79) 110/71 mmHg (08/23 1106) SpO2:  [75 %-99 %] 96 % (08/23 1106)  Physical Exam:  Rhythm:   sinus  Breath sounds: Few rhonchi  Heart sounds:  RRR  Incisions:  n/a  Abdomen:  soft  Extremities:  warm   Intake/Output from previous day: 08/22 0701 - 08/23 0700 In: -  Out: 2650 [Urine:2650] Intake/Output this shift: Total I/O In: 480 [P.O.:480] Out: 1400 [Urine:1400]  Lab Results:  Basename 07/16/12 0500  WBC 5.6  HGB 13.3  HCT 39.7  PLT 109*   BMET:  Basename 07/16/12 0700  NA 134*  K 4.0  CL 99  CO2 25  GLUCOSE 120*  BUN 17  CREATININE 1.02  CALCIUM 9.1    CBG (last 3)  No results found for this basename: GLUCAP:3 in the last 72 hours PT/INR:   Basename 07/16/12 0500  LABPROT 16.6*  INR 1.32    CXR:  *RADIOLOGY REPORT*  Clinical Data: Preop. Cough, smoker.  CHEST - 2 VIEW  Comparison: 07/03/2012  Findings: Cardiomegaly. Blunting of the left costophrenic angle  compatible with small left effusion. This is slightly decreased.  Left base atelectasis also improved. Mild hyperinflation of the  lungs, stable.  IMPRESSION:  Decreasing left pleural effusion and left base atelectasis.  Original Report Authenticated By: Cyndie Chime, M.D.   Pulmonary Function Tests  Baseline      Post-bronchodilator  FVC  2.91 L  (58% predicted) FVC  2.91 L  (58% predicted) FEV1  1.80 L  (48% predicted) FEV1  1.87 L  (49% predicted) FEF25-75 0.97 L  (31% predicted) FEF25-75 1.04 L  (33% predicted)  RV  4.30 L  (187% predicted) DLCO  56% predicted     Assessment/Plan:   Await dental extraction.  Will review  repeat transthoracic ECHO done this morning to see if preop TEE might be useful.  Possible CABG next week after dental extraction.  OWEN,CLARENCE H 07/16/2012 12:55 PM

## 2012-07-16 NOTE — Progress Notes (Addendum)
    SUBJECTIVE: No chest pain or SOB. No events.   Tele: NSR with PVCs  BP 121/75  Pulse 79  Temp 98.3 F (36.8 C) (Oral)  Resp 18  SpO2 93%  Intake/Output Summary (Last 24 hours) at 07/16/12 1610 Last data filed at 07/16/12 0451  Gross per 24 hour  Intake      0 ml  Output   2650 ml  Net  -2650 ml    PHYSICAL EXAM General: Well developed, well nourished, in no acute distress. Alert and oriented x 3.  Psych:  Good affect, responds appropriately Neck: No JVD. No masses noted.  Lungs: Clear bilaterally with no wheezes or rhonci noted.  Heart: RRR with ectopy  with systolic murmur noted. Abdomen: Bowel sounds are present. Soft, non-tender.  Extremities: No lower extremity edema. Right groin cath site ok. No hematoma  LABS: Basic Metabolic Panel: Pending.    CBC:  Basename 07/16/12 0500  WBC 5.6  NEUTROABS --  HGB 13.3  HCT 39.7  MCV 95.7  PLT PENDING    Current Meds:    . albuterol  2 puff Inhalation Q6H  . albuterol      . aspirin EC  81 mg Oral Daily  . carvedilol  3.125 mg Oral BID WC  . chlorhexidine  15 mL Mouth/Throat BID  . furosemide  80 mg Intravenous BID  . lisinopril  5 mg Oral Daily  . sodium chloride  3 mL Intravenous Q12H  . spironolactone  25 mg Oral Daily  . DISCONTD: aspirin  81 mg Oral Daily  . DISCONTD: carvedilol  6.25 mg Oral BID WC   Chest x-ray: 07/16/12:  Findings: Cardiomegaly. Blunting of the left costophrenic angle  compatible with small left effusion. This is slightly decreased.  Left base atelectasis also improved. Mild hyperinflation of the  lungs, stable.  IMPRESSION:  Decreasing left pleural effusion and left base atelectasis.   ASSESSMENT AND PLAN:  1. CAD: Pt found to have severe triple vessel CAD by cath yesterday. He has been seen by Dr. Cornelius Moras with CT surgery. CABG is felt to be an option but will be high risk given his severe LV systolic dysfunction. Will optimize hemodynamics over weekend. Dental consult has been  requested prior to CABG. Dental x-rays this am. Continue ASA, beta blocker, Ace-inh. Will add statin today if LFTs ok this am. Further planning for CABG per Dr. Cornelius Moras.   2. Severe LV systolic dysfunction: Likely combination of ischemic and non-ischemic etiology with history of etoh abuse and now multi-vessel CAD. Continue medical management for now. Will reassess after revascularization and optimal medical therapy.   3. Acute on chronic systolic CHF: Elevated filling pressures on cath yesterday. Still appears to have volume overload. Will continue IV Lasix today. Good diuresis overnight. Hopefully switch to po Lasix in am.   4. COPD: Stable. PFTS this am.    5. 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 If TEE is necessary prior to CABG, we can arrange that for Monday.  6. Tobacco abuse: he stopped smoking last week and does not wish to restart. He does not want a nicotine patch.     MCALHANY,CHRISTOPHER  8/23/20137:42 AM

## 2012-07-17 ENCOUNTER — Encounter (HOSPITAL_COMMUNITY): Payer: Self-pay | Admitting: *Deleted

## 2012-07-17 DIAGNOSIS — Z01810 Encounter for preprocedural cardiovascular examination: Secondary | ICD-10-CM

## 2012-07-17 LAB — BASIC METABOLIC PANEL
CO2: 28 mEq/L (ref 19–32)
GFR calc non Af Amer: 88 mL/min — ABNORMAL LOW (ref >90)
Glucose, Bld: 159 mg/dL — ABNORMAL HIGH (ref 70–99)
Potassium: 3.3 mEq/L — ABNORMAL LOW (ref 3.5–5.1)
Sodium: 136 mEq/L (ref 135–145)

## 2012-07-17 LAB — CBC
Hemoglobin: 12.7 g/dL — ABNORMAL LOW (ref 13.0–17.0)
RBC: 3.93 MIL/uL — ABNORMAL LOW (ref 4.22–5.81)
WBC: 5.8 10*3/uL (ref 4.0–10.5)

## 2012-07-17 MED ORDER — FUROSEMIDE 40 MG PO TABS
40.0000 mg | ORAL_TABLET | Freq: Two times a day (BID) | ORAL | Status: DC
  Administered 2012-07-17 (×2): 40 mg via ORAL
  Filled 2012-07-17 (×6): qty 1

## 2012-07-17 MED ORDER — POTASSIUM CHLORIDE CRYS ER 20 MEQ PO TBCR
40.0000 meq | EXTENDED_RELEASE_TABLET | Freq: Once | ORAL | Status: AC
  Administered 2012-07-17: 40 meq via ORAL

## 2012-07-17 MED ORDER — POTASSIUM CHLORIDE CRYS ER 20 MEQ PO TBCR
EXTENDED_RELEASE_TABLET | ORAL | Status: AC
  Filled 2012-07-17: qty 2

## 2012-07-17 NOTE — Progress Notes (Addendum)
Pre-op Cardiac Surgery  Carotid Findings:  No ICA stenosis.  Vertebral artery flow is antegrade.  Upper Extremity Right Left  Brachial Pressures 110 T 100 T  Radial Waveforms T T  Ulnar Waveforms T T  Palmar Arch (Allen's Test) * **   Findings:  *Right:  Doppler waveforms remain normal with ulnar and radial compressions.                    **Left:  Doppler waveforms obliterate with ulnar and remain normal with radial compressions.   Lower  Extremity Right Left  Dorsalis Pedis    Anterior Tibial T T  Posterior Tibial T T  Ankle/Brachial Indices      Findings:  Palpable pedal pulses x 4.

## 2012-07-17 NOTE — Progress Notes (Signed)
SUBJECTIVE: No chest pain or SOB. No events. BP soft.  Tele: NSR with PVCs  BP 104/68  Pulse 79  Temp 98 F (36.7 C) (Oral)  Resp 16  Ht 5\' 11"  (1.803 m)  Wt 164 lb 4.8 oz (74.526 kg)  BMI 22.92 kg/m2  SpO2 94%  Intake/Output Summary (Last 24 hours) at 07/17/12 0850 Last data filed at 07/16/12 1730  Gross per 24 hour  Intake    600 ml  Output   1400 ml  Net   -800 ml    PHYSICAL EXAM General: Well developed, well nourished, in no acute distress. Alert and oriented x 3.  Psych:  Good affect, responds appropriately Neck: No JVD. No masses noted.  Lungs: Clear bilaterally with mild rhonchi Heart: RRR with ectopy  with systolic murmur at apex. Abdomen: Bowel sounds are present. Soft, non-tender.  Extremities: No lower extremity edema. Right groin cath site ok. No hematoma  LABS: Basic Metabolic Panel: Pending.  BMET stable. Creatinine 0.99  CBC:  Basename 07/17/12 0620 07/16/12 0500  WBC 5.8 5.6  NEUTROABS -- --  HGB 12.7* 13.3  HCT 37.3* 39.7  MCV 94.9 95.7  PLT 113* 109*    Current Meds:    . albuterol  2 puff Inhalation Q6H  . aspirin EC  81 mg Oral Daily  . carvedilol  3.125 mg Oral BID WC  . chlorhexidine  15 mL Mouth/Throat BID  . furosemide  80 mg Intravenous BID  . lisinopril  5 mg Oral Daily  . sodium chloride  3 mL Intravenous Q12H  . spironolactone  25 mg Oral Daily   Chest x-ray: 07/16/12:  Findings: Cardiomegaly. Blunting of the left costophrenic angle  compatible with small left effusion. This is slightly decreased.  Left base atelectasis also improved. Mild hyperinflation of the  lungs, stable.  IMPRESSION:  Decreasing left pleural effusion and left base atelectasis. Room: 3W17C  PERFORMING [place], [facility] SONOGRAPHER Georgian Co, RDCS, CCT ADMITTING Verne Carrow ATTENDING [place], Audie Box, Christopher cc:  ------------------------------------------------------------ LV EF:  20%  ------------------------------------------------------------ Indications: Cardiomyopathy - ischemic 414.8.  ------------------------------------------------------------ History: PMH: Congestive heart failure. Chronic obstructive pulmonary disease. Risk factors: Alcohol abuse. Family history of coronary artery disease. Current tobacco use.  ------------------------------------------------------------ Study Conclusions  - Left ventricle: The cavity size was severely dilated. Wall thickness was normal. The estimated ejection fraction was 20%. Diffuse hypokinesis. Severe hypokinesis of the entireanteroseptal myocardium. - Ventricular septum: Septal motion showed paradox. - Mitral valve: Moderate regurgitation. - Left atrium: The atrium was moderately dilated. - Right ventricle: The cavity size was moderately dilated. - Right atrium: The atrium was moderately dilated. - Tricuspid valve: Moderate regurgitation. - Pulmonary arteries: PA peak pressure: 34mm Hg (S). - Pericardium, extracardiac: A trivial pericardial effusion was identified. Features were not consistent with tamponade physiology.    ASSESSMENT AND PLAN:  1. CAD: Pt found to have severe triple vessel CAD by cath yesterday. He has been seen by Dr. Cornelius Moras with CT surgery. CABG is felt to be an option but will be high risk given his severe LV systolic dysfunction. Will optimize hemodynamics over weekend. Dental consult has been completed. Patient will have dental surgery on Tuesday morning.  Continue ASA, beta blocker, Ace-inh. Will not  add statin because of elevated LFTs.  Further planning for CABG per Dr. Cornelius Moras.   2. Severe LV systolic dysfunction: Likely combination of ischemic and non-ischemic etiology with history of etoh abuse and now multi-vessel CAD. Continue medical management for  now. Will reassess after revascularization and optimal medical therapy.   3. Acute on chronic systolic CHF: Elevated filling pressures  on cath yesterday. Still appears to have volume overload. Will continue IV Lasix today. Good diuresis overnight. Switch to oral lasix today.   4. COPD: Stable. PFTS this am.    5. 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 If TEE is necessary prior to CABG, we can arrange that for Monday. MR is still moderate on yesterday's TTE so will plan for TEE Monday.  6. Tobacco abuse: he stopped smoking last week and does not wish to restart. He does not want a nicotine patch.     Miguel York  8/24/20138:50 AM

## 2012-07-18 LAB — BASIC METABOLIC PANEL
BUN: 13 mg/dL (ref 6–23)
Chloride: 99 mEq/L (ref 96–112)
GFR calc Af Amer: >90 mL/min (ref >90)
Potassium: 3.7 mEq/L (ref 3.5–5.1)

## 2012-07-18 MED ORDER — FUROSEMIDE 40 MG PO TABS
40.0000 mg | ORAL_TABLET | Freq: Every day | ORAL | Status: DC
  Administered 2012-07-18 – 2012-07-22 (×5): 40 mg via ORAL
  Filled 2012-07-18 (×5): qty 1

## 2012-07-18 NOTE — Progress Notes (Signed)
  SUBJECTIVE: The patient has had no new cardiac problems overnight. Coughing mucoid clear sputum.  Tele: NSR with PVCs  BP 96/59  Pulse 90  Temp 98.1 F (36.7 C) (Oral)  Resp 18  Ht 5' 11" (1.803 m)  Wt 159 lb 2.8 oz (72.2 kg)  BMI 22.20 kg/m2  SpO2 95%  Intake/Output Summary (Last 24 hours) at 07/18/12 0832 Last data filed at 07/18/12 0500  Gross per 24 hour  Intake    960 ml  Output   4100 ml  Net  -3140 ml    PHYSICAL EXAM General: Well developed, well nourished, in no acute distress. Alert and oriented x 3.  Psych:  Good affect, responds appropriately Neck: No JVD. No masses noted.  Lungs: Clear bilaterally with mild rhonchi Heart: RRR with ectopy  with systolic murmur at apex. Abdomen: Bowel sounds are present. Soft, non-tender.  Extremities: No lower extremity edema. Right groin cath site ok. No hematoma  LABS: Basic Metabolic Panel: Pending.  BMET stable. Creatinine 0.99  CBC:  Basename 07/17/12 0620 07/16/12 0500  WBC 5.8 5.6  NEUTROABS -- --  HGB 12.7* 13.3  HCT 37.3* 39.7  MCV 94.9 95.7  PLT 113* 109*    Current Meds:    . aspirin EC  81 mg Oral Daily  . carvedilol  3.125 mg Oral BID WC  . chlorhexidine  15 mL Mouth/Throat BID  . furosemide  40 mg Oral BID  . lisinopril  5 mg Oral Daily  . potassium chloride  40 mEq Oral Once  . sodium chloride  3 mL Intravenous Q12H  . spironolactone  25 mg Oral Daily  . DISCONTD: albuterol  2 puff Inhalation Q6H  . DISCONTD: furosemide  80 mg Intravenous BID   Chest x-ray: 07/16/12:  Findings: Cardiomegaly. Blunting of the left costophrenic angle  compatible with small left effusion. This is slightly decreased.  Left base atelectasis also improved. Mild hyperinflation of the  lungs, stable.  IMPRESSION:  Decreasing left pleural effusion and left base atelectasis. Room: 3W17C  PERFORMING [place] SONOGRAPHER Lauren Williams, RDCS, CCT ADMITTING McAlhany, Christopher ATTENDING McAlhany,  Christopher ORDERING McAlhany, Christopher cc:  ------------------------------------------------------------ LV EF: 20%  ------------------------------------------------------------ Indications: Cardiomyopathy - ischemic 414.8.  ------------------------------------------------------------ History: PMH: Congestive heart failure. Chronic obstructive pulmonary disease. Risk factors: Alcohol abuse. Family history of coronary artery disease. Current tobacco use.  ------------------------------------------------------------ Study Conclusions  - Left ventricle: The cavity size was severely dilated. Wall thickness was normal. The estimated ejection fraction was 20%. Diffuse hypokinesis. Severe hypokinesis of the entireanteroseptal myocardium. - Ventricular septum: Septal motion showed paradox. - Mitral valve: Moderate regurgitation. - Left atrium: The atrium was moderately dilated. - Right ventricle: The cavity size was moderately dilated. - Right atrium: The atrium was moderately dilated. - Tricuspid valve: Moderate regurgitation. - Pulmonary arteries: PA peak pressure: 34mm Hg (S). - Pericardium, extracardiac: A trivial pericardial effusion was identified. Features were not consistent with tamponade physiology.    ASSESSMENT AND PLAN:  1. 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 He has been seen by Dr. Owen with CT surgery. CABG is felt to be an option but will be high risk given his severe LV systolic dysfunction. Will optimize hemodynamics over weekend. Dental consult has been completed. Patient will have dental surgery on Tuesday morning.  Continue ASA, beta blocker, Ace-inh. Will not  add statin because of elevated LFTs.  Further planning for CABG per Dr. Owen.   2. Severe LV systolic   dysfunction: Likely combination of ischemic and non-ischemic etiology with history of etoh abuse and now multi-vessel CAD. Continue medical management for now. Will reassess after  revascularization and optimal medical therapy.   3. Acute on chronic systolic CHF: Elevated filling pressures on cath yesterday. Still appears to have volume overload.  Good diuresis overnight  On oral lasix. 5 lb weight loss overnight. BP is still soft. Will decrease lasix to once a day.  4. COPD: Stable. PFTS this am.    5. 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 If TEE is necessary prior to CABG, we can arrange that for Monday. MR is still moderate on yesterday's TTE so will plan for TEE Monday.  6. Tobacco abuse: he stopped smoking last week and does not wish to restart. He does not want a nicotine patch.     Miguel York  8/25/20138:32 AM  

## 2012-07-19 ENCOUNTER — Encounter (HOSPITAL_COMMUNITY)
Admission: AD | Disposition: A | Discharge status: Home / Self Care | Payer: Self-pay | Source: Ambulatory Visit | Attending: Cardiovascular Disease

## 2012-07-19 ENCOUNTER — Inpatient Hospital Stay (HOSPITAL_COMMUNITY): Payer: BC Managed Care – PPO

## 2012-07-19 ENCOUNTER — Encounter (HOSPITAL_COMMUNITY): Payer: Self-pay

## 2012-07-19 DIAGNOSIS — I059 Rheumatic mitral valve disease, unspecified: Secondary | ICD-10-CM

## 2012-07-19 DIAGNOSIS — Z01810 Encounter for preprocedural cardiovascular examination: Secondary | ICD-10-CM

## 2012-07-19 HISTORY — PX: TEE WITHOUT CARDIOVERSION: SHX5443

## 2012-07-19 LAB — COMPREHENSIVE METABOLIC PANEL
ALT: 112 U/L — ABNORMAL HIGH (ref 0–53)
AST: 138 U/L — ABNORMAL HIGH (ref 0–37)
CO2: 27 mEq/L (ref 19–32)
Chloride: 104 mEq/L (ref 96–112)
Creatinine, Ser: 0.74 mg/dL (ref 0.50–1.35)
GFR calc non Af Amer: >90 mL/min (ref >90)
Total Bilirubin: 0.9 mg/dL (ref 0.3–1.2)

## 2012-07-19 SURGERY — ECHOCARDIOGRAM, TRANSESOPHAGEAL
Anesthesia: Moderate Sedation

## 2012-07-19 MED ORDER — LIDOCAINE VISCOUS 2 % MT SOLN
OROMUCOSAL | Status: DC | PRN
  Administered 2012-07-19: 10 mL via OROMUCOSAL

## 2012-07-19 MED ORDER — FENTANYL CITRATE 0.05 MG/ML IJ SOLN
INTRAMUSCULAR | Status: DC | PRN
  Administered 2012-07-19: 12.5 ug via INTRAVENOUS
  Administered 2012-07-19 (×2): 25 ug via INTRAVENOUS

## 2012-07-19 MED ORDER — CEFAZOLIN SODIUM-DEXTROSE 2-3 GM-% IV SOLR: 2.0000 g | INTRAVENOUS | Status: DC

## 2012-07-19 MED ORDER — DIPHENHYDRAMINE HCL 50 MG/ML IJ SOLN
INTRAMUSCULAR | Status: AC
  Filled 2012-07-19: qty 1

## 2012-07-19 MED ORDER — MIDAZOLAM HCL 10 MG/2ML IJ SOLN
INTRAMUSCULAR | Status: DC | PRN
  Administered 2012-07-19 (×2): 2 mg via INTRAVENOUS

## 2012-07-19 MED ORDER — LIDOCAINE VISCOUS 2 % MT SOLN
OROMUCOSAL | Status: AC
  Filled 2012-07-19: qty 15

## 2012-07-19 MED ORDER — DIPHENHYDRAMINE HCL 50 MG/ML IJ SOLN
INTRAMUSCULAR | Status: DC | PRN
  Administered 2012-07-19: 12.5 mg via INTRAVENOUS

## 2012-07-19 MED ORDER — SODIUM CHLORIDE 0.9 % IV SOLN
INTRAVENOUS | Status: DC
  Administered 2012-07-19: 11:00:00 via INTRAVENOUS

## 2012-07-19 MED ORDER — FENTANYL CITRATE 0.05 MG/ML IJ SOLN
INTRAMUSCULAR | Status: AC
  Filled 2012-07-19: qty 4

## 2012-07-19 MED ORDER — CEFAZOLIN SODIUM-DEXTROSE 2-3 GM-% IV SOLR
2.0000 g | INTRAVENOUS | Status: AC
  Administered 2012-07-20: 1 g via INTRAVENOUS
  Filled 2012-07-19: qty 50

## 2012-07-19 MED ORDER — MIDAZOLAM HCL 5 MG/ML IJ SOLN
INTRAMUSCULAR | Status: AC
  Filled 2012-07-19: qty 2

## 2012-07-19 NOTE — H&P (View-Only) (Signed)
SUBJECTIVE: The patient has had no new cardiac problems overnight. Coughing mucoid clear sputum.  Tele: NSR with PVCs  BP 96/59  Pulse 90  Temp 98.1 F (36.7 C) (Oral)  Resp 18  Ht 5\' 11"  (1.803 m)  Wt 159 lb 2.8 oz (72.2 kg)  BMI 22.20 kg/m2  SpO2 95%  Intake/Output Summary (Last 24 hours) at 07/18/12 4540 Last data filed at 07/18/12 0500  Gross per 24 hour  Intake    960 ml  Output   4100 ml  Net  -3140 ml    PHYSICAL EXAM General: Well developed, well nourished, in no acute distress. Alert and oriented x 3.  Psych:  Good affect, responds appropriately Neck: No JVD. No masses noted.  Lungs: Clear bilaterally with mild rhonchi Heart: RRR with ectopy  with systolic murmur at apex. Abdomen: Bowel sounds are present. Soft, non-tender.  Extremities: No lower extremity edema. Right groin cath site ok. No hematoma  LABS: Basic Metabolic Panel: Pending.  BMET stable. Creatinine 0.99  CBC:  Basename 07/17/12 0620 07/16/12 0500  WBC 5.8 5.6  NEUTROABS -- --  HGB 12.7* 13.3  HCT 37.3* 39.7  MCV 94.9 95.7  PLT 113* 109*    Current Meds:    . aspirin EC  81 mg Oral Daily  . carvedilol  3.125 mg Oral BID WC  . chlorhexidine  15 mL Mouth/Throat BID  . furosemide  40 mg Oral BID  . lisinopril  5 mg Oral Daily  . potassium chloride  40 mEq Oral Once  . sodium chloride  3 mL Intravenous Q12H  . spironolactone  25 mg Oral Daily  . DISCONTD: albuterol  2 puff Inhalation Q6H  . DISCONTD: furosemide  80 mg Intravenous BID   Chest x-ray: 07/16/12:  Findings: Cardiomegaly. Blunting of the left costophrenic angle  compatible with small left effusion. This is slightly decreased.  Left base atelectasis also improved. Mild hyperinflation of the  lungs, stable.  IMPRESSION:  Decreasing left pleural effusion and left base atelectasis. Room: 3W17C  PERFORMING [place], [facility] SONOGRAPHER Georgian Co, RDCS, CCT ADMITTING Verne Carrow ATTENDING [place],  Audie Box, Christopher cc:  ------------------------------------------------------------ LV EF: 20%  ------------------------------------------------------------ Indications: Cardiomyopathy - ischemic 414.8.  ------------------------------------------------------------ History: PMH: Congestive heart failure. Chronic obstructive pulmonary disease. Risk factors: Alcohol abuse. Family history of coronary artery disease. Current tobacco use.  ------------------------------------------------------------ Study Conclusions  - Left ventricle: The cavity size was severely dilated. Wall thickness was normal. The estimated ejection fraction was 20%. Diffuse hypokinesis. Severe hypokinesis of the entireanteroseptal myocardium. - Ventricular septum: Septal motion showed paradox. - Mitral valve: Moderate regurgitation. - Left atrium: The atrium was moderately dilated. - Right ventricle: The cavity size was moderately dilated. - Right atrium: The atrium was moderately dilated. - Tricuspid valve: Moderate regurgitation. - Pulmonary arteries: PA peak pressure: 34mm Hg (S). - Pericardium, extracardiac: A trivial pericardial effusion was identified. Features were not consistent with tamponade physiology.    ASSESSMENT AND PLAN:  1. 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 He has been seen by Dr. Cornelius Moras with CT surgery. CABG is felt to be an option but will be high risk given his severe LV systolic dysfunction. Will optimize hemodynamics over weekend. Dental consult has been completed. Patient will have dental surgery on Tuesday morning.  Continue ASA, beta blocker, Ace-inh. Will not  add statin because of elevated LFTs.  Further planning for CABG per Dr. Cornelius Moras.   2. Severe LV systolic  dysfunction: Likely combination of ischemic and non-ischemic etiology with history of etoh abuse and now multi-vessel CAD. Continue medical management for now. Will reassess after  revascularization and optimal medical therapy.   3. Acute on chronic systolic CHF: Elevated filling pressures on cath yesterday. Still appears to have volume overload.  Good diuresis overnight  On oral lasix. 5 lb weight loss overnight. BP is still soft. Will decrease lasix to once a day.  4. COPD: Stable. PFTS this am.    5. 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 If TEE is necessary prior to CABG, we can arrange that for Monday. MR is still moderate on yesterday's TTE so will plan for TEE Monday.  6. Tobacco abuse: he stopped smoking last week and does not wish to restart. He does not want a nicotine patch.     Miguel York  8/25/20138:32 AM

## 2012-07-19 NOTE — Op Note (Signed)
Full report to follow 

## 2012-07-19 NOTE — Progress Notes (Signed)
CSW received inappropriate referral for medication assistance. CSW will inform rn case manager of referral. .No further Clinical Social Work needs, signing off.   Catha Gosselin, Theresia Majors  [phone] .07/19/2012 10:06am

## 2012-07-19 NOTE — Progress Notes (Signed)
Cardiology Progress Note Patient Name: Miguel York Date of Encounter: 07/19/2012, 9:20 AM     Subjective  No overnight events. Patient denies chest pain or sob.   Objective   Telemetry: Sinus rhythm with 1st degree AVB, frequent PVCs  Medications: . aspirin EC  81 mg Oral Daily  . carvedilol  3.125 mg Oral BID WC  .  ceFAZolin (ANCEF) IV  2 g Intravenous 60 min Pre-Op  . chlorhexidine  15 mL Mouth/Throat BID  . furosemide  40 mg Oral Daily  . lisinopril  5 mg Oral Daily  . sodium chloride  3 mL Intravenous Q12H  . spironolactone  25 mg Oral Daily    Physical Exam: Temp:  [97.5 F (36.4 C)-98.3 F (36.8 C)] 97.9 F (36.6 C) (08/26 0802) Pulse Rate:  [51-114] 75  (08/26 0802) Resp:  [16-20] 16  (08/26 0802) BP: (95-120)/(58-80) 110/65 mmHg (08/26 0802) SpO2:  [93 %-97 %] 93 % (08/26 0802)  General: Well developed white male, in no acute distress. Head: Normocephalic, atraumatic, sclera non-icteric, nares are without discharge.  Neck: Supple. Negative for carotid bruits or JVD Lungs: Mild rhonchi bilaterally, with inspiratory wheeze throughout. No rales. Breathing is unlabored. Heart: RRR S1 S2 2/6 systolic murmur at apex. No rubs, or gallops.  Abdomen: Soft, non-tender, non-distended with normoactive bowel sounds. No rebound/guarding. No obvious abdominal masses. Msk:  Strength and tone appear normal for age. Extremities: No edema. No clubbing or cyanosis. Distal pedal pulses are intact and equal bilaterally. Neuro: Alert and oriented X 3. Moves all extremities spontaneously. Psych:  Responds to questions appropriately with a normal affect.   Intake/Output Summary (Last 24 hours) at 07/19/12 0920 Last data filed at 07/19/12 0500  Gross per 24 hour  Intake    600 ml  Output   2050 ml  Net  -1450 ml    Labs:  Basename 07/18/12 0500 07/17/12 0620  NA 135 136  K 3.7 3.3*  CL 99 96  CO2 28 28  GLUCOSE 117* 159*  BUN 13 15  CREATININE 0.87 0.99    CALCIUM 9.1 8.9   Basename 07/17/12 0620  WBC 5.8  HGB 12.7*  HCT 37.3*  MCV 94.9  PLT 113*   Radiology/Studies:   07/17/12 - Carotid Dopplers No ICA stenosis. Vertebral artery flow is antegrade  07/16/2012 - Orthopantogram Findings: Lucency noted adjacent to the left lower pre molar and left lateral incisor could represent early periapical abscess. Otherwise no periapical abscess visualized.  Multiple mandibular and maxillary teeth missing.  IMPRESSION: Question early periapical abscess between the left lower lateral incisor and pre molar  07/16/2012 - Chest 2 View  Findings: Cardiomegaly.  Blunting of the left costophrenic angle compatible with small left effusion.  This is slightly decreased. Left base atelectasis also improved.  Mild hyperinflation of the lungs, stable.  IMPRESSION: Decreasing left pleural effusion and left base atelectasis.        07/16/12 - Transthoracic Echo Study Conclusions: - Left ventricle: The cavity size was severely dilated. Wall thickness was normal. The estimated ejection fraction was 20%. Diffuse hypokinesis. Severe hypokinesis of the entireanteroseptal myocardium. - Ventricular septum: Septal motion showed paradox. - Mitral valve: Moderate regurgitation. - Left atrium: The atrium was moderately dilated. - Right ventricle: The cavity size was moderately dilated. - Right atrium: The atrium was moderately dilated. - Tricuspid valve: Moderate regurgitation. - Pulmonary arteries: PA peak pressure: 34mm Hg (S). - Pericardium, extracardiac: A trivial pericardial effusion  was identified. Features were not consistent with tamponade physiology.   07/15/12 - Cardiac Cath Hemodynamic Findings:  Ao: 104/77  LV: 106/24/34  RA: 23  RV: 42/18/28  PA: 47/25 (mean 35)  PCWP: 27  Fick Cardiac Output: 4.0 L/min  Fick Cardiac Index: 2.0 L/min/m2  Central Aortic Saturation: 96%  Pulmonary Artery Saturation: 61%  Angiographic Findings:  Left main: 20% distal  stenosis.  Left Anterior Descending Artery: Large caliber vessel that courses to the apex. The mid vessel has an ulcerated, 95% stenosis just after the takeoff of the diagonal branch. The distal vessel has diffuse plaque but no focally obstructive lesions. The first diagonal is moderate sized and has 99% mid stenosis. The second diagonal is moderate sized and has mild plaque disease.  Circumflex Artery: Moderate sized vessel with 50% proximal stenosis, 90% mid stenosis. The intermediate branch is moderate sized with no obstructive disease noted. The first OM branch is small to moderate sized with ostial 80% stenosis.  Right Coronary Artery: Large dominant vessel with diffuse 80% stenosis in the mid vessel. There are serial 40-50 lesions in the distal vessel. The PLA and PDA are moderate sized and patent with diffuse mild plaque.  Left Ventricular Angiogram: Deferred.  Impression:  1. Triple vessel CAD  2. Severe LV systolic dysfunction  3. Acute on chronic systolic CHF  4. Ongoing tobacco abuse  Recommendations: Will admit to telemetry for diuresis with IV Lasix tonight given elevated filling pressures. Will consult CT surgery for CABG with mult-vessel CAD.     Assessment and Plan   1. 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 He has been seen by Dr. Cornelius Moras with CT surgery. CABG is felt to be an option but will be high risk given his severe LV systolic dysfunction. Dental consult has been completed. Patient will have dental surgery on Tuesday, 07/20/12 at 9 AM. Continue ASA, beta blocker, Ace-inh. Will not add statin because of elevated LFTs. Further planning for CABG per Dr. Cornelius Moras.   2. Severe LV systolic dysfunction: EF 20% by echo. Likely combination of ischemic and non-ischemic etiology with history of etoh abuse and now multi-vessel CAD. Continue medical management for now. Will reassess after revascularization and optimal medical therapy.   3. Acute on chronic systolic CHF:  Elevated filling pressures on cath. Lasix switched to oral on 8/24 and decreased to once daily dosing yesterday due to soft BPs. BP now stable. Euvolemic on exam. I/Os net (-) 7.2L. Weight down 5lbs (no weight today). Cont BB, Lasix, ACEI, and spironolactone.  4. COPD: Stable. PFTS this am.   5. 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 Plan for TEE at 11:00 today.  6. Tobacco abuse: he stopped smoking last week and does not wish to restart. He does not want a nicotine patch.    Signed, HOPE, JESSICA PA-C  I have personally seen and examined this patient with [facility], PA-C. I agree with the assessment and plan as outlined above. TEE today. Awaiting dental extraction tomorrow and CABG later this week. Appreciate assistance of Dr. Kristin Bruins and Dr. Cornelius Moras.   MCALHANY,CHRISTOPHER 3:30 PM 07/19/2012

## 2012-07-19 NOTE — Interval H&P Note (Signed)
History and Physical Interval Note:  07/19/2012 11:17 AM  Miguel York  has presented today for surgery, with the diagnosis of stroke  The various methods of treatment have been discussed with the patient and family. After consideration of risks, benefits and other options for treatment, the patient has consented to  Procedure(s) (LRB): TRANSESOPHAGEAL ECHOCARDIOGRAM (TEE) (N/A) as a surgical intervention .  The patient's history has been reviewed, patient examined, no change in status, stable for surgery.  I have reviewed the patient's chart and labs.  Questions were answered to the patient's satisfaction.     [name]

## 2012-07-20 ENCOUNTER — Encounter (HOSPITAL_COMMUNITY): Payer: Self-pay | Admitting: Anesthesiology

## 2012-07-20 ENCOUNTER — Encounter (HOSPITAL_COMMUNITY)
Admission: AD | Disposition: A | Discharge status: Home / Self Care | Payer: Self-pay | Source: Ambulatory Visit | Attending: Cardiovascular Disease

## 2012-07-20 ENCOUNTER — Encounter (HOSPITAL_COMMUNITY): Payer: Self-pay

## 2012-07-20 ENCOUNTER — Inpatient Hospital Stay (HOSPITAL_COMMUNITY): Payer: BC Managed Care – PPO | Admitting: Anesthesiology

## 2012-07-20 DIAGNOSIS — K036 Deposits [accretions] on teeth: Secondary | ICD-10-CM | POA: Diagnosis present

## 2012-07-20 DIAGNOSIS — K08109 Complete loss of teeth, unspecified cause, unspecified class: Secondary | ICD-10-CM

## 2012-07-20 DIAGNOSIS — K0530 Chronic periodontitis, unspecified: Secondary | ICD-10-CM

## 2012-07-20 DIAGNOSIS — K083 Retained dental root: Secondary | ICD-10-CM | POA: Diagnosis present

## 2012-07-20 DIAGNOSIS — I2510 Atherosclerotic heart disease of native coronary artery without angina pectoris: Secondary | ICD-10-CM

## 2012-07-20 DIAGNOSIS — K0889 Other specified disorders of teeth and supporting structures: Secondary | ICD-10-CM | POA: Diagnosis present

## 2012-07-20 HISTORY — PX: MULTIPLE EXTRACTIONS WITH ALVEOLOPLASTY: SHX5342

## 2012-07-20 LAB — BASIC METABOLIC PANEL
BUN: 16 mg/dL (ref 6–23)
CO2: 29 mEq/L (ref 19–32)
Chloride: 99 mEq/L (ref 96–112)
Creatinine, Ser: 0.94 mg/dL (ref 0.50–1.35)

## 2012-07-20 SURGERY — MULTIPLE EXTRACTION WITH ALVEOLOPLASTY
Anesthesia: General | Site: Mouth

## 2012-07-20 MED ORDER — LACTATED RINGERS IV SOLN
INTRAVENOUS | Status: DC | PRN
  Administered 2012-07-20: 07:00:00 via INTRAVENOUS

## 2012-07-20 MED ORDER — LIDOCAINE HCL (CARDIAC) 20 MG/ML IV SOLN
INTRAVENOUS | Status: DC | PRN
  Administered 2012-07-20: 100 mg via INTRAVENOUS

## 2012-07-20 MED ORDER — BUPIVACAINE HCL 0.5 % IJ SOLN
INTRAMUSCULAR | Status: AC
  Filled 2012-07-20: qty 1

## 2012-07-20 MED ORDER — LACTATED RINGERS IV SOLN: INTRAVENOUS | Status: DC

## 2012-07-20 MED ORDER — MIDAZOLAM HCL 5 MG/5ML IJ SOLN
INTRAMUSCULAR | Status: DC | PRN
  Administered 2012-07-20: 1 mg via INTRAVENOUS
  Administered 2012-07-20: 2 mg via INTRAVENOUS

## 2012-07-20 MED ORDER — PROPOFOL 10 MG/ML IV BOLUS
INTRAVENOUS | Status: DC | PRN
  Administered 2012-07-20: 20 mg via INTRAVENOUS

## 2012-07-20 MED ORDER — LIDOCAINE-EPINEPHRINE 2 %-1:100000 IJ SOLN
INTRAMUSCULAR | Status: DC | PRN
  Administered 2012-07-20: 6.8 mL

## 2012-07-20 MED ORDER — OXYMETAZOLINE HCL 0.05 % NA SOLN
NASAL | Status: AC
  Filled 2012-07-20: qty 15

## 2012-07-20 MED ORDER — PROPOFOL INFUSION 10 MG/ML OPTIME
INTRAVENOUS | Status: DC | PRN
  Administered 2012-07-20: 25 ug/kg/min via INTRAVENOUS

## 2012-07-20 MED ORDER — HYDROMORPHONE HCL PF 1 MG/ML IJ SOLN: 0.2500 mg | INTRAMUSCULAR | Status: DC | PRN

## 2012-07-20 MED ORDER — MIDAZOLAM HCL 2 MG/2ML IJ SOLN: 1.0000 mg | INTRAMUSCULAR | Status: DC | PRN

## 2012-07-20 MED ORDER — ONDANSETRON HCL 4 MG/2ML IJ SOLN
INTRAMUSCULAR | Status: DC | PRN
  Administered 2012-07-20: 4 mg via INTRAVENOUS

## 2012-07-20 MED ORDER — OXYCODONE-ACETAMINOPHEN 5-325 MG PO TABS
1.0000 | ORAL_TABLET | ORAL | Status: DC | PRN
  Administered 2012-07-20 – 2012-07-21 (×5): 2 via ORAL
  Filled 2012-07-20 (×5): qty 2

## 2012-07-20 MED ORDER — SODIUM CHLORIDE 0.9 % IR SOLN: 30.0000 mL | Status: DC | PRN

## 2012-07-20 MED ORDER — MORPHINE SULFATE 2 MG/ML IJ SOLN
2.0000 mg | INTRAMUSCULAR | Status: DC | PRN
  Administered 2012-07-20: 2 mg via INTRAVENOUS
  Filled 2012-07-20: qty 1

## 2012-07-20 MED ORDER — BUPIVACAINE-EPINEPHRINE (PF) 0.5% -1:200000 IJ SOLN
INTRAMUSCULAR | Status: AC
  Filled 2012-07-20: qty 7.2

## 2012-07-20 MED ORDER — FENTANYL CITRATE 0.05 MG/ML IJ SOLN
INTRAMUSCULAR | Status: DC | PRN
  Administered 2012-07-20 (×5): 25 ug via INTRAVENOUS

## 2012-07-20 MED ORDER — BOOST / RESOURCE BREEZE PO LIQD
1.0000 | Freq: Three times a day (TID) | ORAL | Status: DC
  Administered 2012-07-20 – 2012-07-22 (×7): 1 via ORAL

## 2012-07-20 MED ORDER — SODIUM CHLORIDE 0.9 % IR SOLN
30.0000 mL | Status: DC
  Administered 2012-07-20 – 2012-07-23 (×25): 30 mL

## 2012-07-20 MED ORDER — BUPIVACAINE-EPINEPHRINE 0.5% -1:200000 IJ SOLN
INTRAMUSCULAR | Status: DC | PRN
  Administered 2012-07-20: 6 mL

## 2012-07-20 MED ORDER — LIDOCAINE-EPINEPHRINE 2 %-1:100000 IJ SOLN
INTRAMUSCULAR | Status: AC
  Filled 2012-07-20: qty 6.8

## 2012-07-20 MED ORDER — LACTATED RINGERS IV SOLN
INTRAVENOUS | Status: DC
  Administered 2012-07-20: 06:00:00 via INTRAVENOUS

## 2012-07-20 MED ORDER — PROPOFOL 10 MG/ML IV EMUL
INTRAVENOUS | Status: DC | PRN
  Administered 2012-07-20: 25 ug/kg/min via INTRAVENOUS

## 2012-07-20 MED ORDER — FENTANYL CITRATE 0.05 MG/ML IJ SOLN: 50.0000 ug | INTRAMUSCULAR | Status: DC | PRN

## 2012-07-20 SURGICAL SUPPLY — 35 items
ALCOHOL 70% 16 OZ (MISCELLANEOUS) ×2 IMPLANT
ATTRACTOMAT 16X20 MAGNETIC DRP (DRAPES) ×2 IMPLANT
BLADE SURG 15 STRL LF DISP TIS (BLADE) ×2 IMPLANT
BLADE SURG 15 STRL SS (BLADE) ×2
CLOTH BEACON ORANGE TIMEOUT ST (SAFETY) ×2 IMPLANT
COVER SURGICAL LIGHT HANDLE (MISCELLANEOUS) ×2 IMPLANT
CRADLE DONUT ADULT HEAD (MISCELLANEOUS) ×2 IMPLANT
GAUZE PACKING FOLDED 2  STR (GAUZE/BANDAGES/DRESSINGS) ×1
GAUZE PACKING FOLDED 2 STR (GAUZE/BANDAGES/DRESSINGS) ×1 IMPLANT
GAUZE SPONGE 4X4 16PLY XRAY LF (GAUZE/BANDAGES/DRESSINGS) ×2 IMPLANT
GLOVE SURG ORTHO 8.0 STRL STRW (GLOVE) ×2 IMPLANT
GLOVE SURG SS PI 6.5 STRL IVOR (GLOVE) ×2 IMPLANT
GOWN STRL REIN 3XL LVL4 (GOWN DISPOSABLE) ×2 IMPLANT
HEMOSTAT SURGICEL .5X2 ABSORB (HEMOSTASIS) IMPLANT
KIT BASIN OR (CUSTOM PROCEDURE TRAY) ×2 IMPLANT
KIT ROOM TURNOVER OR (KITS) ×2 IMPLANT
MANIFOLD NEPTUNE WASTE (CANNULA) IMPLANT
NEEDLE BLUNT 16X1.5 OR ONLY (NEEDLE) ×2 IMPLANT
NEEDLE DENTAL 27 LONG (NEEDLE) IMPLANT
NS IRRIG 1000ML POUR BTL (IV SOLUTION) ×2 IMPLANT
PACK EENT II TURBAN DRAPE (CUSTOM PROCEDURE TRAY) ×2 IMPLANT
PAD ARMBOARD 7.5X6 YLW CONV (MISCELLANEOUS) ×4 IMPLANT
SPONGE GAUZE 4X4 12PLY (GAUZE/BANDAGES/DRESSINGS) ×2 IMPLANT
SPONGE SURGIFOAM ABS GEL 100 (HEMOSTASIS) IMPLANT
SPONGE SURGIFOAM ABS GEL 12-7 (HEMOSTASIS) IMPLANT
SPONGE SURGIFOAM ABS GEL SZ50 (HEMOSTASIS) IMPLANT
SUCTION FRAZIER TIP 10 FR DISP (SUCTIONS) ×2 IMPLANT
SUT CHROMIC 3 0 PS 2 (SUTURE) ×8 IMPLANT
SUT CHROMIC 4 0 P 3 18 (SUTURE) IMPLANT
SYR 50ML SLIP (SYRINGE) ×2 IMPLANT
TOWEL OR 17X24 6PK STRL BLUE (TOWEL DISPOSABLE) ×2 IMPLANT
TOWEL OR 17X26 10 PK STRL BLUE (TOWEL DISPOSABLE) ×2 IMPLANT
TUBE CONNECTING 12X1/4 (SUCTIONS) ×2 IMPLANT
WATER STERILE IRR 1000ML POUR (IV SOLUTION) ×2 IMPLANT
YANKAUER SUCT BULB TIP NO VENT (SUCTIONS) ×2 IMPLANT

## 2012-07-20 NOTE — Op Note (Signed)
Patient:            Miguel York Date of Birth:  [birth year] MRN:                [id number]   DATE OF PROCEDURE:  07/20/2012               OPERATIVE REPORT   PREOPERATIVE DIAGNOSES: 1. Coronary Artery disease 2. Cardiomyopathy 3. Thrombocytopenia 4. Chronic periodontitis 5. Retained root segments 6. Loose teeth  POSTOPERATIVE DIAGNOSES: 1. Coronary Artery disease 2. Cardiomyopathy 3. Thrombocytopenia 4. Chronic periodontitis 5. Retained root segments 6. Loose teeth  OPERATIONS: 1. Multiple extraction of tooth numbers 2, 3, 4, 5, 6, 7, 8, 9, 10, 11, 17, 19, 20, 21, 22, 27, 28, and 29. 2. 4 Quadrants of alveoloplasty   SURGEON: Charlynne Pander, DDS  ASSISTANT: Zettie Pho, (dental assistant)  ANESTHESIA: Monitored anesthesia care per the anesthesia team   MEDICATIONS: 1. Ancef 1 g IV prior to invasive dental procedures. 2. Local anesthesia with a total utilization of 4 carpules each containing 34 mg of lidocaine with 0.017 mg of epinephrine as well as 4 carpules each containing 9 mg of bupivacaine with 0.009 mg of epinephrine.  SPECIMENS: There are 18 teeth that were discarded.  DRAINS: None  CULTURES: None  COMPLICATIONS: None   ESTIMATED BLOOD LOSS: 100 mLs.  INTRAVENOUS FLUIDS: 700 mLs of Lactated ringers solution.  INDICATIONS: The patient was recently diagnosed with 3 vessel coronary artery disease.  A dental consultation was then requested to rule out dental infection that may affect the patient's systemic health and anticipated heart surgery.  The patient was examined and treatment planned for extraction remaining teeth with alveoloplasty and pre-prosthetic surgery as indicated.  This treatment plan was formulated to decrease the risks and complications associated with dental infection from affecting the patient's systemic health and the anticipated coronary artery bypass graft procedure along with possible mitral annuloplasty.  OPERATIVE  FINDINGS: Patient was examined operating room number 10.  The teeth were identified for extraction. The patient was noted be affected by chronic periodontitis, multiple loose teeth, and retained root segments.   DESCRIPTION OF PROCEDURE: Patient was brought to the main operating room number. Patient was then placed in the supine position on the operating table. Monitored anesthesia care was then induced per the anesthesia team. The patient was then prepped and draped in the usual manner for dental medicine procedure. A timeout was performed. The patient was identified and procedures were verified. The oral cavity was then thoroughly examined with the findings noted above. The patient was then ready for dental medicine procedure as follows:  Local anesthesia was then administered sequentially with a total utilization of 4 carpules each containing 34 mg of lidocaine with 0.017 mg of epinephrine as well as 4 carpules  each containing 9 mg bupivacaine with 0.009 mg of epinephrine.  The Maxillary left and right quadrants first approached. Anesthesia was then delivered utilizing infiltration with lidocaine with epinephrine. A #15 blade incision was then made from the distal of the maxillary right tuberosity and extended to the distal of #13.  A  surgical flap was then carefully reflected. Appropriate amounts of buccal and interseptal bone were then removed utilizing a surgical handpiece and bur and copious amounts of sterile water.  The teeth were then subluxated with a series of straight elevators. Tooth numbers 2 and 3 were then removed with a 53R forceps without complications. Tooth numbers 4, 5, 6, 7, 8, 9, 10, 11  were then removed with a 150 forceps without complications. Alveoloplasty was then performed utilizing a ronguers and bone file. The surgical site was then irrigated with copious amounts of sterile saline. The tissues were approximated and trimmed appropriately. The surgical site was then closed  from the maxillary right tuberosity and extended the mesial #8 utilizing 3-0 chromic gut suture in a continuous interrupted suture technique x1. The maxillary left surgical site was then closed from the distal of #13 and extended the mesial #9 utilizing 3-0 chromic gut suture in a continuous interrupted suture technique x1.  At this point time, the mandibular quadrants were approached. The patient was given bilateral inferior alveolar nerve blocks and long buccal nerve blocks utilizing the bupivacaine with epinephrine. Further infiltration was then achieved utilizing the lidocaine with epinephrine. A 15 blade incision was then made from the distal of number #17 and extended to a mesial of #24.  A second 15 blade incision was then made from the distal of #31 and extended to the mesial of #25. A surgical flap was then carefully reflected. Appropriate amounts of buccal and interseptal bone were then removed appropriately with a surgical handpiece and bur and copious amounts of sterile water. Tooth numbers 17 and 19 were then removed with a 23 forceps without complications.  Tooth numbers 20, 21, 22, 27, 28, 29 were then removed with a 151 forceps. Retained roots were noted in the area of #28. Further bone was then removed a surgical handpiece and bur and copious of sterile water. The root was then elevated out with a root tip pick without complications. Alveoloplasty was then performed utilizing a rongeurs and bone file. The tissues were approximated and trimmed appropriately. The surgical sites were then irrigated with copious amounts of sterile saline. The surgical site was then closed from the distal of #17 and extended to the mesial of #24 utilizing 3-0 chromic gut suture in a continuous interrupted suture technique x1.  Please note: Primary closure is unable to be obtained in the area number 17 due to significant bone and soft tissue loss on the lingual aspect .  At this point time the mandibular right surgical  site was closed from the distal of #31 and extended the mesial numbers 25 utilizing 3-0 chromic suture in a continuous interrupted suture technique x1.  At this point time, the entire mouth was irrigated with copious amounts of sterile saline. The patient was exam for complications, seeing none, the dental medicine procedure was deemed to be complete.  A series of 4 x 4 gauze were placed in the mouth to aid hemostasis. The patient was then handed over to the anesthesia team for final disposition. After an appropriate amount of time, the patient was taken to the postanesthsia care unit with a good condition. All counts were correct for the dental medicine procedure.  The patient will be scheduled for heart surgery with Dr. Cornelius Moras as per his discretion.   Charlynne Pander, DDS.

## 2012-07-20 NOTE — Progress Notes (Signed)
INITIAL ADULT NUTRITION ASSESSMENT Date: 07/20/2012   Time: 11:48 AM   INTERVENTION:  Advance diet as medically appropriate --- Pureed vs Full Liquids  Resource Breeze supplement 3 times daily (250 kcals, 9 gm protein per 8 fl oz carton)  RD to follow for nutrition care plan  Reason for Assessment: Consult  ASSESSMENT: Male 61 y.o.  Dx: Congestive heart failure  Hx:  Past Medical History  Diagnosis Date  . COPD (chronic obstructive pulmonary disease)   . Congestive heart failure 07/15/2012  . Coronary artery disease 07/15/2012    Related Meds:     . aspirin EC  81 mg Oral Daily  . carvedilol  3.125 mg Oral BID WC  .  ceFAZolin (ANCEF) IV  2 g Intravenous On Call  . chlorhexidine  15 mL Mouth/Throat BID  . furosemide  40 mg Oral Daily  . lisinopril  5 mg Oral Daily  . sodium chloride  3 mL Intravenous Q12H  . sodium chloride irrigation  30 mL Irrigation Q2H while awake  . spironolactone  25 mg Oral Daily  . DISCONTD:  ceFAZolin (ANCEF) IV  2 g Intravenous 60 min Pre-Op    Ht: 5\' 11"  (180.3 cm)  Wt: 159 lb 2.8 oz (72.2 kg)  Ideal Wt: 78.1 kg % Ideal Wt: 92%  Usual Wt: 175 lb -- per office visit record 07/06/12 % Usual Wt: 91%  Body mass index is 22.20 kg/(m^2).  Food/Nutrition Related Hx: no triggers per admission nutrition screen  Labs:  CMP     Component Value Date/Time   NA 135 07/20/2012 0522   K 3.6 07/20/2012 0522   CL 99 07/20/2012 0522   CO2 29 07/20/2012 0522   GLUCOSE 127* 07/20/2012 0522   BUN 16 07/20/2012 0522   CREATININE 0.94 07/20/2012 0522   CREATININE 1.12 07/12/2012 0907   CALCIUM 9.1 07/20/2012 0522   PROT 8.0 07/19/2012 1255   ALBUMIN 2.8* 07/19/2012 1255   AST 138* 07/19/2012 1255   ALT 112* 07/19/2012 1255   ALKPHOS 74 07/19/2012 1255   BILITOT 0.9 07/19/2012 1255   GFRNONAA 90* 07/20/2012 0522   GFRAA >90 07/20/2012 0522     Intake/Output Summary (Last 24 hours) at 07/20/12 1153 Last data filed at 07/20/12 0915  Gross per 24 hour    Intake   1000 ml  Output    100 ml  Net    900 ml    Diet Order: Clear Liquid  Supplements/Tube Feeding: N/A  IVF:    lactated ringers   DISCONTD: sodium chloride Last Rate: 20 mL/hr at 07/19/12 1044  DISCONTD: lactated ringers   DISCONTD: lactated ringers Last Rate: 50 mL/hr at 07/20/12 0547    Estimated Nutritional Needs:   Kcal: 1800-2000 Protein: 90-100 gm Fluid:  1.8-2.0 L  Patient s/p multiple tooth extractions with alveoloplasty 8/27; spoke briefly to patient's wife regarding nutrition hx; states patient wasn't eating very well PTA given his "drinking", however, now his appetite is very good; PO intake 100% per flowsheet records; would benefit from addition of supplement -- patient amenable -- RD to order.  NUTRITION DIAGNOSIS: -Predicted suboptimal energy intake (NI-1.6).  Status: Ongoing  RELATED TO: biting & chewing difficulty  AS EVIDENCE BY: s/p multiple tooth extractions with alveoloplasty   MONITORING/EVALUATION(Goals): Goal: Oral intake with meals & supplements to meet >/= 90% of estimated nutrition needs Monitor: PO & supplemental intake, weight, labs, I/O's  EDUCATION NEEDS: -No education needs identified at this time  Dietitian #: [phone]  DOCUMENTATION  CODES Per approved criteria  -Not Applicable    Alger Memos 07/20/2012, 11:48 AM

## 2012-07-20 NOTE — Progress Notes (Signed)
   CARDIOTHORACIC SURGERY PROGRESS NOTE  Day of Surgery  S/P Procedure(s) (LRB): MULTIPLE EXTRACION WITH ALVEOLOPLASTY (N/A)  Subjective: Just back from dental extraction.  Mouth sore.  Objective: Vital signs in last 24 hours: Temp:  [96.3 F (35.7 C)-98.2 F (36.8 C)] 96.3 F (35.7 C) (08/27 1028) Pulse Rate:  [65-85] 85  (08/27 1028) Cardiac Rhythm:  [-] Normal sinus rhythm (08/27 1030) Resp:  [16-26] 20  (08/27 1028) BP: (91-118)/(46-82) 110/70 mmHg (08/27 1320) SpO2:  [93 %-97 %] 96 % (08/27 1028)  Physical Exam:  Rhythm:   sinus  Breath sounds: Fairly clear  Heart sounds:  RRR  Incisions:  n/a  Abdomen:  soft  Extremities:  warm   Intake/Output from previous day: 08/26 0701 - 08/27 0700 In: 300 [I.V.:300] Out: -  Intake/Output this shift: Total I/O In: 700 [I.V.:700] Out: 100 [Blood:100]  Lab Results: No results found for this basename: WBC:2,HGB:2,HCT:2,PLT:2 in the last 72 hours BMET:  [facility] 07/20/12 0522 07/19/12 1255  NA 135 137  K 3.6 3.9  CL 99 104  CO2 29 27  GLUCOSE 127* 88  BUN 16 14  CREATININE 0.94 0.74  CALCIUM 9.1 8.8    CBG (last 3)  No results found for this basename: GLUCAP:3 in the last 72 hours PT/INR:  No results found for this basename: LABPROT,INR in the last 72 hours  CXR:  *RADIOLOGY REPORT*  Clinical Data: 61 year old male preoperative study, shortness of  breath.  CHEST - 2 VIEW  Comparison: 07/16/2012 and earlier.  Findings: Stable mild cardiomegaly. Other mediastinal contours are  within normal limits. Stable lung volumes. No pneumothorax or  edema. Linear and streaky opacity at the left lung base over the  series of exam has not significantly changed, suspect atelectasis  and/or scarring. No consolidation or new pulmonary opacity. No  acute osseous abnormality identified.  IMPRESSION:  Left lung base scarring or atelectasis suspected. Otherwise no  acute cardiopulmonary abnormality.  Original Report  Authenticated By: Harley Hallmark, M.D.   ------------------------------------------------------------ Transesophageal Echocardiography  Patient: Miguel York, Miguel York MR #: [id number] Study Date: 07/19/2012 Gender: M Age: 61 Height: Weight: BSA: Pt. Status: Room: 3W17C  PERFORMING [place] SONOGRAPHER Perley Jain, RDCS Candis Shine cc:  ------------------------------------------------------------  ------------------------------------------------------------ History: PMH: Evaluate Valves Transesophageal echocardiography. 2D and color Doppler. Location: Endoscopy.  ------------------------------------------------------------  ------------------------------------------------------------ Left ventricle: LVEF is severely depressed.  ------------------------------------------------------------ Aortic valve: AV is mildly thickened. No AI.  ------------------------------------------------------------ Aorta: MIld fixed plaque in thoracic aorta.  ------------------------------------------------------------ Mitral valve: MV is mildly thickened. There are 2 central jets of MR that are moderate in severity. Mitral annulus measures 36 mm in diameter.  ------------------------------------------------------------ Atrial septum: No PFO as tested with injection of agitated saline.  ------------------------------------------------------------ Tricuspid valve: TV is normal . Mild TR>  ------------------------------------------------------------ Post procedure conclusions Ascending Aorta:  - MIld fixed plaque in thoracic aorta.  ------------------------------------------------------------ Prepared and Electronically Authenticated by  [name]    Assessment/Plan: S/P Procedure(s) (LRB): MULTIPLE EXTRACION WITH ALVEOLOPLASTY (N/A)  Results of transesophageal echocardiogram noted. Tentatively plan for surgery Friday.  OWEN,CLARENCE  H 07/20/2012 2:06 PM

## 2012-07-20 NOTE — Progress Notes (Signed)
SUBJECTIVE: No chest pain or SOB. He feels well. His mouth is sore post dental extraction.   BP 102/77  Pulse 78  Temp 97.7 F (36.5 C) (Oral)  Resp 16  Ht 5\' 11"  (1.803 m)  Wt 159 lb 2.8 oz (72.2 kg)  BMI 22.20 kg/m2  SpO2 97%  Intake/Output Summary (Last 24 hours) at 07/20/12 1610 Last data filed at 07/19/12 1220  Gross per 24 hour  Intake    300 ml  Output      0 ml  Net    300 ml    PHYSICAL EXAM General: Well developed, well nourished, in no acute distress. Alert and oriented x 3.  Psych:  Good affect, responds appropriately Neck: No JVD. No masses noted.  Lungs: Clear bilaterally with no wheezes or rhonci noted.  Heart: RRR with no murmurs noted. Abdomen: Bowel sounds are present. Soft, non-tender.  Extremities: No lower extremity edema.   LABS: Basic Metabolic Panel:  Basename 07/20/12 0522 07/19/12 1255  NA 135 137  K 3.6 3.9  CL 99 104  CO2 29 27  GLUCOSE 127* 88  BUN 16 14  CREATININE 0.94 0.74  CALCIUM 9.1 8.8  MG -- --  PHOS -- --    Current Meds:    . aspirin EC  81 mg Oral Daily  . carvedilol  3.125 mg Oral BID WC  .  ceFAZolin (ANCEF) IV  2 g Intravenous On Call  . chlorhexidine  15 mL Mouth/Throat BID  . furosemide  40 mg Oral Daily  . lisinopril  5 mg Oral Daily  . sodium chloride  3 mL Intravenous Q12H  . spironolactone  25 mg Oral Daily  . DISCONTD:  ceFAZolin (ANCEF) IV  2 g Intravenous 60 min Pre-Op   Radiology/Studies:  07/17/12 - Carotid Dopplers  No ICA stenosis. Vertebral artery flow is antegrade  07/16/2012 - Orthopantogram  Findings: Lucency noted adjacent to the left lower pre molar and left lateral incisor could represent early periapical abscess. Otherwise no periapical abscess visualized. Multiple mandibular and maxillary teeth missing. IMPRESSION: Question early periapical abscess between the left lower lateral incisor and pre molar  07/16/2012 - Chest 2 View  Findings: Cardiomegaly. Blunting of the left costophrenic  angle compatible with small left effusion. This is slightly decreased. Left base atelectasis also improved. Mild hyperinflation of the lungs, stable. IMPRESSION: Decreasing left pleural effusion and left base atelectasis.  07/16/12 - Transthoracic Echo  Study Conclusions: - Left ventricle: The cavity size was severely dilated. Wall thickness was normal. The estimated ejection fraction was 20%. Diffuse hypokinesis. Severe hypokinesis of the entireanteroseptal myocardium. - Ventricular septum: Septal motion showed paradox. - Mitral valve: Moderate regurgitation. - Left atrium: The atrium was moderately dilated. - Right ventricle: The cavity size was moderately dilated. - Right atrium: The atrium was moderately dilated. - Tricuspid valve: Moderate regurgitation. - Pulmonary arteries: PA peak pressure: 34mm Hg (S). - Pericardium, extracardiac: A trivial pericardial effusion was identified. Features were not consistent with tamponade physiology.  07/15/12 - Cardiac Cath  Hemodynamic Findings:  Ao: 104/77  LV: 106/24/34  RA: 23  RV: 42/18/28  PA: 47/25 (mean 35)  PCWP: 27  Fick Cardiac Output: 4.0 L/min  Fick Cardiac Index: 2.0 L/min/m2  Central Aortic Saturation: 96%  Pulmonary Artery Saturation: 61%  Angiographic Findings:  Left main: 20% distal stenosis.  Left Anterior Descending Artery: Large caliber vessel that courses to the apex. The mid vessel has an ulcerated, 95% stenosis just after  the takeoff of the diagonal branch. The distal vessel has diffuse plaque but no focally obstructive lesions. The first diagonal is moderate sized and has 99% mid stenosis. The second diagonal is moderate sized and has mild plaque disease.  Circumflex Artery: Moderate sized vessel with 50% proximal stenosis, 90% mid stenosis. The intermediate branch is moderate sized with no obstructive disease noted. The first OM branch is small to moderate sized with ostial 80% stenosis.  Right Coronary Artery: Large  dominant vessel with diffuse 80% stenosis in the mid vessel. There are serial 40-50 lesions in the distal vessel. The PLA and PDA are moderate sized and patent with diffuse mild plaque.  Left Ventricular Angiogram: Deferred.  Impression:  1. Triple vessel CAD  2. Severe LV systolic dysfunction  3. Acute on chronic systolic CHF  4. Ongoing tobacco abuse  Recommendations: Will admit to telemetry for diuresis with IV Lasix tonight given elevated filling pressures. Will consult CT surgery for CABG with mult-vessel CAD.       ASSESSMENT AND PLAN:  1. 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 He has been seen by Dr. Cornelius Moras with CT surgery. CABG is felt to be an option but will be high risk given his severe LV systolic dysfunction. Dental consult has been completed. Patient had dental extraction this am.  Continue ASA, beta blocker, Ace-inh. Will not add statin because of elevated LFTs. Further planning for CABG per Dr. Cornelius Moras.   2. Severe LV systolic dysfunction: EF 20% by echo. Likely combination of ischemic and non-ischemic etiology with history of etoh abuse and now multi-vessel CAD. Continue medical management for now. Will reassess after revascularization and optimal medical therapy.   3. Acute on chronic systolic CHF: Appears euvolemic. Continue po Lasix.  Continue BB, Lasix, ACEI, and spironolactone.   4. COPD: Stable.   5. MR/TR:  MR appears moderate on TEE yesterday. TR is mild.    6. Tobacco abuse: He stopped smoking last week and does not wish to restart. He does not want a nicotine patch.      Miguel York,Miguel York  8/27/20137:28 AM

## 2012-07-20 NOTE — Anesthesia Postprocedure Evaluation (Signed)
  Anesthesia Post-op Note  Patient: Miguel York  Procedure(s) Performed: Procedure(s) (LRB): MULTIPLE EXTRACION WITH ALVEOLOPLASTY (N/A)  Patient Location: PACU  Anesthesia Type: General  Level of Consciousness: awake  Airway and Oxygen Therapy: Patient Spontanous Breathing  Post-op Pain: mild  Post-op Assessment: Post-op Vital signs reviewed  Post-op Vital Signs: Reviewed  Complications: No apparent anesthesia complications

## 2012-07-20 NOTE — Progress Notes (Signed)
Utilization review completed.  

## 2012-07-20 NOTE — Care Management Note (Unsigned)
    Page 1 of 1   07/20/2012     12:21:26 PM   CARE MANAGEMENT NOTE 07/20/2012  Patient:  Miguel York, Miguel York   Account Number:  [id number]  Date Initiated:  07/20/2012  Documentation initiated by:  GRAVES-BIGELOW,Jenilee Franey  Subjective/Objective Assessment:   Pt admitted with cp s/p cath revealed 3 vessel CAD. S/P teeth extraction. Plan for CABG Thursday. Pt is from home with wife and granddaughter.     Action/Plan:   Pt may need HH services after procedure will continue to monitor for disposition needs. CM did discuss medications and pt has insurance. Wife stated he is out of work and haing hard time paying for meds.   Anticipated DC Date:  07/27/2012   Anticipated DC Plan:  HOME W HOME HEALTH SERVICES      DC Planning Services  CM consult      Choice offered to / List presented to:             Status of service:  In process, will continue to follow Medicare Important Message given?   (If response is "NO", the following Medicare IM given date fields will be blank) Date Medicare IM given:   Date Additional Medicare IM given:    Discharge Disposition:    Per UR Regulation:  Reviewed for med. necessity/level of care/duration of stay  If discussed at Long Length of Stay Meetings, dates discussed:    Comments:  07-20-12 1219 Tomi Bamberger, [place] [phone] CM discussed with pt on options of checking different pharmacies. He goes to [facility]. CM discussed [facility] and [facility] med list for $4.00 if possible. CM will continue to f/u.

## 2012-07-20 NOTE — Anesthesia Preprocedure Evaluation (Addendum)
Anesthesia Evaluation  Patient identified by MRN, date of birth, ID band Patient awake    Reviewed: Allergy & Precautions, H&P , NPO status , Patient's Chart, lab work & pertinent test results, reviewed documented beta blocker date and time   Airway Mallampati: I TM Distance: >3 FB Neck ROM: Full    Dental  (+) Poor Dentition and Dental Advisory Given   Pulmonary COPDformer smoker,  breath sounds clear to auscultation        Cardiovascular + CAD, +CHF and + DOE + Valvular Problems/Murmurs  Scheduled for O[facility] 8/30 EF 15%   Neuro/Psych negative neurological ROS  negative psych ROS   GI/Hepatic negative GI ROS, Neg liver ROS,   Endo/Other  negative endocrine ROS  Renal/GU negative Renal ROS     Musculoskeletal   Abdominal   Peds  Hematology negative hematology ROS (+)   Anesthesia Other Findings   Reproductive/Obstetrics                          Anesthesia Physical Anesthesia Plan  ASA: IV  Anesthesia Plan: MAC   Post-op Pain Management:    Induction: Intravenous  Airway Management Planned: Mask  Additional Equipment:   Intra-op Plan:   Post-operative Plan:   Informed Consent: I have reviewed the patients History and Physical, chart, labs and discussed the procedure including the risks, benefits and alternatives for the proposed anesthesia with the patient or authorized representative who has indicated his/her understanding and acceptance.   Dental advisory given  Plan Discussed with: Anesthesiologist, CRNA and Surgeon  Anesthesia Plan Comments:        Anesthesia Quick Evaluation

## 2012-07-20 NOTE — Transfer of Care (Signed)
Immediate Anesthesia Transfer of Care Note  Patient: Miguel York  Procedure(s) Performed: Procedure(s) (LRB): MULTIPLE EXTRACION WITH ALVEOLOPLASTY (N/A)  Patient Location: PACU  Anesthesia Type: MAC  Level of Consciousness: awake, alert  and oriented  Airway & Oxygen Therapy: Patient Spontanous Breathing and Patient connected to nasal cannula oxygen  Post-op Assessment: Report given to PACU RN and Post -op Vital signs reviewed and stable  Post vital signs: Reviewed and stable  Complications: No apparent anesthesia complications

## 2012-07-20 NOTE — Preoperative (Signed)
Beta Blockers   Reason not to administer Beta Blockers:Not Applicable 

## 2012-07-20 NOTE — Progress Notes (Signed)
PRE OPERATIVE NOTE:  07/20/2012 Miguel York [id number]  VITALS: BP 102/77  Pulse 78  Temp 97.7 F (36.5 C) (Oral)  Resp 16  Ht 5\' 11"  (1.803 m)  Wt 159 lb 2.8 oz (72.2 kg)  BMI 22.20 kg/m2  SpO2 97%  Lab Results  Component Value Date   WBC 5.8 07/17/2012   HGB 12.7* 07/17/2012   HCT 37.3* 07/17/2012   MCV 94.9 07/17/2012   PLT 113* 07/17/2012   BMET    Component Value Date/Time   NA 135 07/20/2012 0522   K 3.6 07/20/2012 0522   CL 99 07/20/2012 0522   CO2 29 07/20/2012 0522   GLUCOSE 127* 07/20/2012 0522   BUN 16 07/20/2012 0522   CREATININE 0.94 07/20/2012 0522   CREATININE 1.12 07/12/2012 0907   CALCIUM 9.1 07/20/2012 0522   GFRNONAA 90* 07/20/2012 0522   GFRAA >90 07/20/2012 0522   Lab Results  Component Value Date   INR 1.32 07/16/2012   No results found for this basename: PTT   Patient presents for extraction of remaining teeth with alveoloplasty and pre-prosthetic surgery as needed.  SUBJECTIVE: Patient without acute medical or dental changes.  EXAM: No sign of acute changes since consultation exam on 07/16/12.  ASSESSMENT: Patient with chronic periodontitis, tooth mobility, retained roots as before.  PLAN: Proceed with extraction of remaining teeth with alveoloplasty and pre-prosthetic surgery in the OR. Will use Monitored anesthesia care per anesthesia due to low EF of 15% and severe COPD if tolerated by patient. May consider switch to General anesthesia if needed. Patient is aware of potential complications up to and including death.  Charlynne Pander, DDS

## 2012-07-21 DIAGNOSIS — K08109 Complete loss of teeth, unspecified cause, unspecified class: Secondary | ICD-10-CM

## 2012-07-21 DIAGNOSIS — I2510 Atherosclerotic heart disease of native coronary artery without angina pectoris: Secondary | ICD-10-CM

## 2012-07-21 DIAGNOSIS — IMO0002 Reserved for concepts with insufficient information to code with codable children: Secondary | ICD-10-CM

## 2012-07-21 LAB — BASIC METABOLIC PANEL
BUN: 14 mg/dL (ref 6–23)
CO2: 31 mEq/L (ref 19–32)
Chloride: 100 mEq/L (ref 96–112)
Glucose, Bld: 101 mg/dL — ABNORMAL HIGH (ref 70–99)
Potassium: 3.8 mEq/L (ref 3.5–5.1)

## 2012-07-21 LAB — CBC
HCT: 39.6 % (ref 39.0–52.0)
Hemoglobin: 13.3 g/dL (ref 13.0–17.0)
MCH: 32.5 pg (ref 26.0–34.0)
MCHC: 33.6 g/dL (ref 30.0–36.0)
MCV: 96.8 fL (ref 78.0–100.0)

## 2012-07-21 LAB — TYPE AND SCREEN: Antibody Screen: NEGATIVE

## 2012-07-21 NOTE — Progress Notes (Signed)
   CARDIOTHORACIC SURGERY PROGRESS NOTE  1 Day Post-Op  S/P Procedure(s) (LRB): MULTIPLE EXTRACION WITH ALVEOLOPLASTY (N/A)  Subjective: Feels okay.  Mouth sore but improved.  No SOB  Objective: Vital signs in last 24 hours: Temp:  [96.4 F (35.8 C)-98.4 F (36.9 C)] 98.4 F (36.9 C) (08/28 1200) Pulse Rate:  [51-85] 85  (08/28 1200) Cardiac Rhythm:  [-] Normal sinus rhythm;Heart block (08/28 0748) Resp:  [16-18] 16  (08/28 1200) BP: (91-116)/(45-78) 116/76 mmHg (08/28 1200) SpO2:  [94 %-98 %] 97 % (08/28 1200) Weight:  [73.4 kg (161 lb 13.1 oz)] 73.4 kg (161 lb 13.1 oz) (08/28 0400)  Physical Exam:  Rhythm:   sinus  Breath sounds: Few rhonchi  Heart sounds:  RRR  Incisions:  n/a  Abdomen:  soft  Extremities:  warm   Intake/Output from previous day: 08/27 0701 - 08/28 0700 In: 703 [I.V.:703] Out: 100 [Blood:100] Intake/Output this shift:    Lab Results:  Basename 07/21/12 0630  WBC 5.7  HGB 13.3  HCT 39.6  PLT 122*   BMET:  Basename 07/21/12 0630 07/20/12 0522  NA 136 135  K 3.8 3.6  CL 100 99  CO2 31 29  GLUCOSE 101* 127*  BUN 14 16  CREATININE 0.89 0.94  CALCIUM 8.8 9.1    CBG (last 3)  No results found for this basename: GLUCAP:3 in the last 72 hours PT/INR:  No results found for this basename: LABPROT,INR in the last 72 hours  CXR:  N/A  Assessment/Plan: S/P Procedure(s) (LRB): MULTIPLE EXTRACION WITH ALVEOLOPLASTY (N/A)  I have again reviewed the indications, risks, and potential benefits of coronary artery bypass grafting with Mr. Troy and his wife this afternoon. We spent in excess of 30 minutes discussing the results of his recent transesophageal echocardiogram. The rationale for concomitant mitral annuloplasty has been discussed. They understand the somewhat high-risks associated with surgery under the circumstances given his severe cardiomyopathy and underlying chronic lung disease.  The patient understands and accepts all potential  associated risks of surgery including but not limited to risk of death, stroke, myocardial infarction, congestive heart failure, respiratory failure, renal failure, bleeding requiring blood transfusion and/or reexploration, arrhythmia, heart block or bradycardia requiring permanent pacemaker, pneumonia, pleural effusion, wound infection, pulmonary embolus or other thromboembolic complication, chronic pain or other delayed complications.  All questions answered.  We plan for surgery Friday, August 30.   OWEN,CLARENCE H 07/21/2012 3:15 PM

## 2012-07-21 NOTE — Progress Notes (Signed)
CSW received inappropriate referral for medication assistance. RN case manager aware of patient needs and received identical referral. .No further Clinical Social Work needs, signing off.   Catha Gosselin, Theresia Majors  [phone] .07/21/2012 11:16

## 2012-07-21 NOTE — Progress Notes (Signed)
SUBJECTIVE: No chest pain or SOB. No events. Mouth sore from procedure.   BP 101/64  Pulse 51  Temp 96.4 F (35.8 C) (Axillary)  Resp 16  Ht 5\' 11"  (1.803 m)  Wt 161 lb 13.1 oz (73.4 kg)  BMI 22.57 kg/m2  SpO2 94%  Intake/Output Summary (Last 24 hours) at 07/21/12 0743 Last data filed at 07/20/12 2202  Gross per 24 hour  Intake    703 ml  Output    100 ml  Net    603 ml    PHYSICAL EXAM General: Well developed, well nourished, in no acute distress. Alert and oriented x 3.  Psych:  Good affect, responds appropriately Neck: No JVD. No masses noted.  Lungs: Clear bilaterally with no wheezes or rhonci noted.  Heart: RRR with no murmurs noted. Abdomen: Bowel sounds are present. Soft, non-tender.  Extremities: No lower extremity edema.   LABS: Basic Metabolic Panel:  Basename 07/20/12 0522 07/19/12 1255  NA 135 137  K 3.6 3.9  CL 99 104  CO2 29 27  GLUCOSE 127* 88  BUN 16 14  CREATININE 0.94 0.74  CALCIUM 9.1 8.8  MG -- --  PHOS -- --   CBC:  Basename 07/21/12 0630  WBC 5.7  NEUTROABS --  HGB 13.3  HCT 39.6  MCV 96.8  PLT 122*   Current Meds:    . aspirin EC  81 mg Oral Daily  . carvedilol  3.125 mg Oral BID WC  . chlorhexidine  15 mL Mouth/Throat BID  . feeding supplement  1 Container Oral TID BM  . furosemide  40 mg Oral Daily  . lisinopril  5 mg Oral Daily  . sodium chloride  3 mL Intravenous Q12H  . sodium chloride irrigation  30 mL Irrigation Q2H while awake  . spironolactone  25 mg Oral Daily   Radiology/Studies:  07/17/12 - Carotid Dopplers  No ICA stenosis. Vertebral artery flow is antegrade   07/16/2012 - Orthopantogram  Findings: Lucency noted adjacent to the left lower pre molar and left lateral incisor could represent early periapical abscess. Otherwise no periapical abscess visualized. Multiple mandibular and maxillary teeth missing. IMPRESSION: Question early periapical abscess between the left lower lateral incisor and pre molar    07/16/2012 - Chest 2 View  Findings: Cardiomegaly. Blunting of the left costophrenic angle compatible with small left effusion. This is slightly decreased. Left base atelectasis also improved. Mild hyperinflation of the lungs, stable. IMPRESSION: Decreasing left pleural effusion and left base atelectasis.   07/16/12 - Transthoracic Echo  Study Conclusions: - Left ventricle: The cavity size was severely dilated. Wall thickness was normal. The estimated ejection fraction was 20%. Diffuse hypokinesis. Severe hypokinesis of the entireanteroseptal myocardium. - Ventricular septum: Septal motion showed paradox. - Mitral valve: Moderate regurgitation. - Left atrium: The atrium was moderately dilated. - Right ventricle: The cavity size was moderately dilated. - Right atrium: The atrium was moderately dilated. - Tricuspid valve: Moderate regurgitation. - Pulmonary arteries: PA peak pressure: 34mm Hg (S). - Pericardium, extracardiac: A trivial pericardial effusion was identified. Features were not consistent with tamponade physiology.   07/15/12 - Cardiac Cath  Hemodynamic Findings:  Ao: 104/77  LV: 106/24/34  RA: 23  RV: 42/18/28  PA: 47/25 (mean 35)  PCWP: 27  Fick Cardiac Output: 4.0 L/min  Fick Cardiac Index: 2.0 L/min/m2  Central Aortic Saturation: 96%  Pulmonary Artery Saturation: 61%  Angiographic Findings:  Left main: 20% distal stenosis.  Left Anterior Descending Artery:  Large caliber vessel that courses to the apex. The mid vessel has an ulcerated, 95% stenosis just after the takeoff of the diagonal branch. The distal vessel has diffuse plaque but no focally obstructive lesions. The first diagonal is moderate sized and has 99% mid stenosis. The second diagonal is moderate sized and has mild plaque disease.  Circumflex Artery: Moderate sized vessel with 50% proximal stenosis, 90% mid stenosis. The intermediate branch is moderate sized with no obstructive disease noted. The first OM  branch is small to moderate sized with ostial 80% stenosis.  Right Coronary Artery: Large dominant vessel with diffuse 80% stenosis in the mid vessel. There are serial 40-50 lesions in the distal vessel. The PLA and PDA are moderate sized and patent with diffuse mild plaque.  Left Ventricular Angiogram: Deferred.  Impression:  1. Triple vessel CAD  2. Severe LV systolic dysfunction  3. Acute on chronic systolic CHF  4. Ongoing tobacco abuse  Recommendations: Will admit to telemetry for diuresis with IV Lasix tonight given elevated filling pressures. Will consult CT surgery for CABG with mult-vessel CAD.    ASSESSMENT AND PLAN:   1. 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 He has been seen by Dr. Cornelius Moras with CT surgery. Plans for CABG on Friday. Continue ASA, beta blocker, Ace-inh. Will not add statin because of elevated LFTs.   2. Severe LV systolic dysfunction: EF 20% by echo. Likely combination of ischemic and non-ischemic etiology with history of etoh abuse and multi-vessel CAD. Continue medical management for now. Will reassess after revascularization and optimal medical therapy.   3. Acute on chronic systolic CHF: Appears euvolemic. Continue po Lasix. Continue BB, Lasix, ACEI, and spironolactone.   4. COPD: Stable.   5. MR/TR: MR is moderate on TEE. TR is mild.   6. Tobacco abuse: He has stopped smoking and states that he does not wish to restart. He does not want a nicotine patch.   7. Dental caries: s/p complete dental extraction yesterday.       MCALHANY,CHRISTOPHER  8/28/20137:43 AM

## 2012-07-22 ENCOUNTER — Encounter (HOSPITAL_COMMUNITY): Payer: Self-pay | Admitting: Dentistry

## 2012-07-22 DIAGNOSIS — I2510 Atherosclerotic heart disease of native coronary artery without angina pectoris: Secondary | ICD-10-CM

## 2012-07-22 MED ORDER — VANCOMYCIN HCL 1000 MG IV SOLR
1250.0000 mg | INTRAVENOUS | Status: AC
  Administered 2012-07-23: 1250 mg via INTRAVENOUS
  Filled 2012-07-22: qty 1250

## 2012-07-22 MED ORDER — TRANEXAMIC ACID (OHS) BOLUS VIA INFUSION
15.0000 mg/kg | INTRAVENOUS | Status: AC
Start: 2012-07-23 — End: 2012-07-23
  Administered 2012-07-23: 1191 mg via INTRAVENOUS
  Filled 2012-07-22: qty 1191

## 2012-07-22 MED ORDER — NITROGLYCERIN IN D5W 200-5 MCG/ML-% IV SOLN: 2.0000 ug/min | INTRAVENOUS | Status: DC

## 2012-07-22 MED ORDER — TRANEXAMIC ACID (OHS) PUMP PRIME SOLUTION
2.0000 mg/kg | INTRAVENOUS | Status: DC
  Filled 2012-07-22: qty 1.59

## 2012-07-22 MED ORDER — SODIUM CHLORIDE 0.9 % IV SOLN
1.5000 mg/kg/h | INTRAVENOUS | Status: DC
  Filled 2012-07-22: qty 25

## 2012-07-22 MED ORDER — VANCOMYCIN HCL 1000 MG IV SOLR
1250.0000 mg | INTRAVENOUS | Status: DC
  Filled 2012-07-22: qty 1250

## 2012-07-22 MED ORDER — PHENYLEPHRINE HCL 10 MG/ML IJ SOLN
30.0000 ug/min | INTRAVENOUS | Status: DC
  Filled 2012-07-22: qty 2

## 2012-07-22 MED ORDER — POTASSIUM CHLORIDE 2 MEQ/ML IV SOLN
80.0000 meq | INTRAVENOUS | Status: DC
  Filled 2012-07-22: qty 40

## 2012-07-22 MED ORDER — BISACODYL 5 MG PO TBEC
5.0000 mg | DELAYED_RELEASE_TABLET | Freq: Once | ORAL | Status: DC
  Filled 2012-07-22: qty 1

## 2012-07-22 MED ORDER — MAGNESIUM SULFATE 50 % IJ SOLN
40.0000 meq | INTRAMUSCULAR | Status: DC
  Filled 2012-07-22: qty 10

## 2012-07-22 MED ORDER — TRANEXAMIC ACID 100 MG/ML IV SOLN
1.5000 mg/kg/h | INTRAVENOUS | Status: DC
  Filled 2012-07-22: qty 25

## 2012-07-22 MED ORDER — SODIUM BICARBONATE 8.4 % IV SOLN
INTRAVENOUS | Status: DC
  Filled 2012-07-22: qty 2.5

## 2012-07-22 MED ORDER — SODIUM CHLORIDE 0.9 % IV SOLN
INTRAVENOUS | Status: DC
  Filled 2012-07-22: qty 1

## 2012-07-22 MED ORDER — DOPAMINE-DEXTROSE 3.2-5 MG/ML-% IV SOLN: 2.0000 ug/kg/min | INTRAVENOUS | Status: DC

## 2012-07-22 MED ORDER — DOPAMINE-DEXTROSE 3.2-5 MG/ML-% IV SOLN
2.0000 ug/kg/min | INTRAVENOUS | Status: DC
  Filled 2012-07-22: qty 250

## 2012-07-22 MED ORDER — EPINEPHRINE HCL 1 MG/ML IJ SOLN
0.5000 ug/min | INTRAVENOUS | Status: DC
  Filled 2012-07-22: qty 4

## 2012-07-22 MED ORDER — DEXTROSE 5 % IV SOLN
1.5000 g | INTRAVENOUS | Status: DC
  Filled 2012-07-22: qty 1.5

## 2012-07-22 MED ORDER — TRANEXAMIC ACID (OHS) BOLUS VIA INFUSION
15.0000 mg/kg | INTRAVENOUS | Status: DC
  Filled 2012-07-22: qty 1191

## 2012-07-22 MED ORDER — DEXTROSE 5 % IV SOLN
750.0000 mg | INTRAVENOUS | Status: DC
  Filled 2012-07-22: qty 750

## 2012-07-22 MED ORDER — CHLORHEXIDINE GLUCONATE 4 % EX LIQD
60.0000 mL | Freq: Once | CUTANEOUS | Status: AC
  Administered 2012-07-23: 4 via TOPICAL
  Filled 2012-07-22 (×2): qty 30

## 2012-07-22 MED ORDER — DEXMEDETOMIDINE HCL IN NACL 400 MCG/100ML IV SOLN
0.1000 ug/kg/h | INTRAVENOUS | Status: DC
Start: 2012-07-23 — End: 2012-07-22
  Filled 2012-07-22: qty 100

## 2012-07-22 MED ORDER — DEXTROSE 5 % IV SOLN
1.5000 g | INTRAVENOUS | Status: AC
  Administered 2012-07-23: 750 g via INTRAVENOUS
  Administered 2012-07-23: 1.5 g via INTRAVENOUS
  Filled 2012-07-22: qty 1.5

## 2012-07-22 MED ORDER — DEXMEDETOMIDINE HCL IN NACL 400 MCG/100ML IV SOLN
0.1000 ug/kg/h | INTRAVENOUS | Status: DC
  Filled 2012-07-22: qty 100

## 2012-07-22 MED ORDER — TEMAZEPAM 15 MG PO CAPS: 15.0000 mg | ORAL_CAPSULE | Freq: Once | ORAL | Status: AC | PRN

## 2012-07-22 MED ORDER — METOPROLOL TARTRATE 12.5 MG HALF TABLET
12.5000 mg | ORAL_TABLET | Freq: Once | ORAL | Status: AC
  Administered 2012-07-23: 12.5 mg via ORAL
  Filled 2012-07-22: qty 1

## 2012-07-22 MED ORDER — SODIUM BICARBONATE 8.4 % IV SOLN
INTRAVENOUS | Status: AC
  Administered 2012-07-23: 10:00:00
  Filled 2012-07-22: qty 2.5

## 2012-07-22 MED ORDER — NITROGLYCERIN IN D5W 200-5 MCG/ML-% IV SOLN
2.0000 ug/min | INTRAVENOUS | Status: DC
  Filled 2012-07-22: qty 250

## 2012-07-22 NOTE — Progress Notes (Signed)
    SUBJECTIVE: No chest pain or SOB. No events. His mouth is still oozing. He slept well.   BP 109/70  Pulse 85  Temp 97.3 F (36.3 C) (Oral)  Resp 18  Ht 5\' 11"  (1.803 m)  Wt 161 lb 13.1 oz (73.4 kg)  BMI 22.57 kg/m2  SpO2 94% No intake or output data in the 24 hours ending 07/22/12 0601  PHYSICAL EXAM General: Well developed, well nourished, in no acute distress. Alert and oriented x 3.  Psych:  Good affect, responds appropriately Neck: No JVD. No masses noted.  Lungs: Clear bilaterally with no wheezes or rhonci noted.  Heart: RRR with ectopy with systolic murmur noted. Abdomen: Bowel sounds are present. Soft, non-tender.  Extremities: No lower extremity edema.   LABS: Basic Metabolic Panel:  Basename 07/21/12 0630 07/20/12 0522  NA 136 135  K 3.8 3.6  CL 100 99  CO2 31 29  GLUCOSE 101* 127*  BUN 14 16  CREATININE 0.89 0.94  CALCIUM 8.8 9.1  MG -- --  PHOS -- --   CBC:  Basename 07/21/12 0630  WBC 5.7  NEUTROABS --  HGB 13.3  HCT 39.6  MCV 96.8  PLT 122*   Current Meds:    . aspirin EC  81 mg Oral Daily  . carvedilol  3.125 mg Oral BID WC  . chlorhexidine  15 mL Mouth/Throat BID  . feeding supplement  1 Container Oral TID BM  . furosemide  40 mg Oral Daily  . lisinopril  5 mg Oral Daily  . sodium chloride  3 mL Intravenous Q12H  . sodium chloride irrigation  30 mL Irrigation Q2H while awake  . spironolactone  25 mg Oral Daily     ASSESSMENT AND PLAN:  1. 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 He has been seen by Dr. Cornelius Moras with CT surgery. Plans for CABG tomorrow.  Continue ASA, beta blocker, Ace-inh. Will not add statin because of elevated LFTs.   2. Severe LV systolic dysfunction: EF 20% by echo. Likely combination of ischemic and non-ischemic etiology with history of etoh abuse and multi-vessel CAD. Continue medical management for now. Hopefully, will see some improvement in LV function with revascularization.    3. Acute on  chronic systolic CHF: Appears euvolemic. Continue po Lasix. Continue BB, Lasix, ACEI, and spironolactone.   4. COPD: Stable.   5. MR/TR: MR is moderate on TEE. TR is mild.   6. Tobacco abuse: He has stopped smoking and states that he does not wish to restart. He does not want a nicotine patch.   7. Dental caries: s/p complete dental extraction 07/20/12.   8. Dispo: For CABG tomorrow per Dr. Cornelius Moras.     Miguel York  8/29/20136:01 AM

## 2012-07-22 NOTE — Progress Notes (Signed)
   CARDIOTHORACIC SURGERY PROGRESS NOTE  2 Days Post-Op  S/P Procedure(s) (LRB): MULTIPLE EXTRACION WITH ALVEOLOPLASTY (N/A)  Subjective: No complaints.  Denies SOB  Objective: Vital signs in last 24 hours: Temp:  [97.3 F (36.3 C)-98.5 F (36.9 C)] 98.5 F (36.9 C) (08/29 1200) Pulse Rate:  [51-85] 51  (08/29 1200) Cardiac Rhythm:  [-] Heart block (08/29 0812) Resp:  [18] 18  (08/29 1200) BP: (92-109)/(58-70) 101/60 mmHg (08/29 1200) SpO2:  [94 %-98 %] 98 % (08/29 1200) Weight:  [79.379 kg (175 lb)] 79.379 kg (175 lb) (08/29 0500)  Physical Exam:  Rhythm:   sinus  Breath sounds: clear  Heart sounds:  RRR  Incisions:  n/a  Abdomen:  soft  Extremities:  warm   Intake/Output from previous day:   Intake/Output this shift:    Lab Results:  Basename 07/21/12 0630  WBC 5.7  HGB 13.3  HCT 39.6  PLT 122*   BMET:  Basename 07/21/12 0630 07/20/12 0522  NA 136 135  K 3.8 3.6  CL 100 99  CO2 31 29  GLUCOSE 101* 127*  BUN 14 16  CREATININE 0.89 0.94  CALCIUM 8.8 9.1    CBG (last 3)  No results found for this basename: GLUCAP:3 in the last 72 hours PT/INR:  No results found for this basename: LABPROT,INR in the last 72 hours  CXR:  N/A  Assessment/Plan: S/P Procedure(s) (LRB): MULTIPLE EXTRACION WITH ALVEOLOPLASTY (N/A)  For OR tomorrow All questions answered  Yaniyah Koors H 07/22/2012 4:00 PM

## 2012-07-23 ENCOUNTER — Inpatient Hospital Stay (HOSPITAL_COMMUNITY): Payer: BC Managed Care – PPO | Admitting: Anesthesiology

## 2012-07-23 ENCOUNTER — Inpatient Hospital Stay (HOSPITAL_COMMUNITY): Payer: BC Managed Care – PPO

## 2012-07-23 ENCOUNTER — Encounter (HOSPITAL_COMMUNITY)
Admission: AD | Disposition: A | Discharge status: Home / Self Care | Payer: Self-pay | Source: Ambulatory Visit | Attending: Cardiovascular Disease

## 2012-07-23 ENCOUNTER — Encounter (HOSPITAL_COMMUNITY): Payer: Self-pay | Admitting: Thoracic Surgery (Cardiothoracic Vascular Surgery)

## 2012-07-23 ENCOUNTER — Encounter (HOSPITAL_COMMUNITY): Payer: Self-pay | Admitting: Anesthesiology

## 2012-07-23 DIAGNOSIS — Z951 Presence of aortocoronary bypass graft: Secondary | ICD-10-CM

## 2012-07-23 DIAGNOSIS — I059 Rheumatic mitral valve disease, unspecified: Secondary | ICD-10-CM

## 2012-07-23 DIAGNOSIS — Z9889 Other specified postprocedural states: Secondary | ICD-10-CM

## 2012-07-23 DIAGNOSIS — I2510 Atherosclerotic heart disease of native coronary artery without angina pectoris: Secondary | ICD-10-CM

## 2012-07-23 HISTORY — PX: OTHER SURGICAL HISTORY: SHX169

## 2012-07-23 HISTORY — DX: Other specified postprocedural states: Z98.890

## 2012-07-23 HISTORY — PX: MITRAL VALVE REPAIR: SHX2039

## 2012-07-23 HISTORY — DX: Presence of aortocoronary bypass graft: Z95.1

## 2012-07-23 HISTORY — PX: CORONARY ARTERY BYPASS GRAFT: SHX141

## 2012-07-23 LAB — CBC
Hemoglobin: 10.4 g/dL — ABNORMAL LOW (ref 13.0–17.0)
MCHC: 34.5 g/dL (ref 30.0–36.0)
MCHC: 34.7 g/dL (ref 30.0–36.0)
Platelets: 89 10*3/uL — ABNORMAL LOW (ref 150–400)
RDW: 13.4 % (ref 11.5–15.5)
RDW: 13.5 % (ref 11.5–15.5)
WBC: 11 10*3/uL — ABNORMAL HIGH (ref 4.0–10.5)
WBC: 8.8 10*3/uL (ref 4.0–10.5)

## 2012-07-23 LAB — POCT I-STAT 3, ART BLOOD GAS (G3+)
Acid-Base Excess: 3 mmol/L — ABNORMAL HIGH (ref 0.0–2.0)
Acid-Base Excess: 3 mmol/L — ABNORMAL HIGH (ref 0.0–2.0)
Bicarbonate: 28.2 mEq/L — ABNORMAL HIGH (ref 20.0–24.0)
Bicarbonate: 28.6 mEq/L — ABNORMAL HIGH (ref 20.0–24.0)
Bicarbonate: 26.8 mEq/L — ABNORMAL HIGH (ref 20.0–24.0)
O2 Saturation: 97 %
O2 Saturation: 96 %
Patient temperature: 38.4
TCO2: 28 mmol/L (ref 0–100)
TCO2: 30 mmol/L (ref 0–100)
TCO2: 28 mmol/L (ref 0–100)
TCO2: 28 mmol/L (ref 0–100)
pCO2 arterial: 43.3 mmHg (ref 35.0–45.0)
pCO2 arterial: 45.6 mmHg — ABNORMAL HIGH (ref 35.0–45.0)
pCO2 arterial: 46.1 mmHg — ABNORMAL HIGH (ref 35.0–45.0)
pCO2 arterial: 42.1 mmHg (ref 35.0–45.0)
pH, Arterial: 7.422 (ref 7.350–7.450)
pH, Arterial: 7.375 (ref 7.350–7.450)
pH, Arterial: 7.402 (ref 7.350–7.450)
pO2, Arterial: 371 mmHg — ABNORMAL HIGH (ref 80.0–100.0)
pO2, Arterial: 348 mmHg — ABNORMAL HIGH (ref 80.0–100.0)
pO2, Arterial: 89 mmHg (ref 80.0–100.0)
pO2, Arterial: 98 mmHg (ref 80.0–100.0)

## 2012-07-23 LAB — POCT I-STAT 4, (NA,K, GLUC, HGB,HCT)
Glucose, Bld: 119 mg/dL — ABNORMAL HIGH (ref 70–99)
Glucose, Bld: 130 mg/dL — ABNORMAL HIGH (ref 70–99)
Glucose, Bld: 143 mg/dL — ABNORMAL HIGH (ref 70–99)
Glucose, Bld: 151 mg/dL — ABNORMAL HIGH (ref 70–99)
Glucose, Bld: 155 mg/dL — ABNORMAL HIGH (ref 70–99)
HCT: 42 % (ref 39.0–52.0)
HCT: 33 % — ABNORMAL LOW (ref 39.0–52.0)
HCT: 34 % — ABNORMAL LOW (ref 39.0–52.0)
Hemoglobin: 14.3 g/dL (ref 13.0–17.0)
Hemoglobin: 11.2 g/dL — ABNORMAL LOW (ref 13.0–17.0)
Hemoglobin: 11.6 g/dL — ABNORMAL LOW (ref 13.0–17.0)
Potassium: 4 mEq/L (ref 3.5–5.1)
Potassium: 5.5 mEq/L — ABNORMAL HIGH (ref 3.5–5.1)
Sodium: 137 mEq/L (ref 135–145)
Sodium: 135 mEq/L (ref 135–145)

## 2012-07-23 LAB — APTT: aPTT: 33 seconds (ref 24–37)

## 2012-07-23 LAB — POCT ACTIVATED CLOTTING TIME: Activated Clotting Time: 469 seconds

## 2012-07-23 LAB — CREATININE, SERUM
Creatinine, Ser: 0.74 mg/dL (ref 0.50–1.35)
GFR calc non Af Amer: >90 mL/min (ref >90)

## 2012-07-23 LAB — POCT I-STAT, CHEM 8
Calcium, Ion: 1.17 mmol/L (ref 1.12–1.23)
Creatinine, Ser: 0.9 mg/dL (ref 0.50–1.35)
Glucose, Bld: 125 mg/dL — ABNORMAL HIGH (ref 70–99)
Hemoglobin: 9.9 g/dL — ABNORMAL LOW (ref 13.0–17.0)
TCO2: 24 mmol/L (ref 0–100)

## 2012-07-23 LAB — POCT I-STAT GLUCOSE: Operator id: 333011

## 2012-07-23 LAB — GLUCOSE, CAPILLARY: Glucose-Capillary: 76 mg/dL (ref 70–99)

## 2012-07-23 LAB — PROTIME-INR
INR: 1.4 (ref 0.00–1.49)
Prothrombin Time: 17.4 seconds — ABNORMAL HIGH (ref 11.6–15.2)

## 2012-07-23 SURGERY — CORONARY ARTERY BYPASS GRAFTING (CABG)
Anesthesia: General | Site: Chest | Wound class: Clean

## 2012-07-23 MED ORDER — SODIUM CHLORIDE 0.45 % IV SOLN
INTRAVENOUS | Status: DC
  Administered 2012-07-23: 20 mL/h via INTRAVENOUS

## 2012-07-23 MED ORDER — METOPROLOL TARTRATE 25 MG/10 ML ORAL SUSPENSION
12.5000 mg | Freq: Two times a day (BID) | ORAL | Status: DC
  Filled 2012-07-23 (×5): qty 5

## 2012-07-23 MED ORDER — INSULIN REGULAR BOLUS VIA INFUSION
0.0000 [IU] | Freq: Three times a day (TID) | INTRAVENOUS | Status: DC
Start: 2012-07-23 — End: 2012-07-23
  Filled 2012-07-23: qty 10

## 2012-07-23 MED ORDER — INSULIN ASPART 100 UNIT/ML ~~LOC~~ SOLN
0.0000 [IU] | SUBCUTANEOUS | Status: DC
  Administered 2012-07-24 (×4): 2 [IU] via SUBCUTANEOUS
  Administered 2012-07-24: 4 [IU] via SUBCUTANEOUS
  Administered 2012-07-25 (×4): 2 [IU] via SUBCUTANEOUS

## 2012-07-23 MED ORDER — HEMOSTATIC AGENTS (NO CHARGE) OPTIME
TOPICAL | Status: DC | PRN
  Administered 2012-07-23: 1 via TOPICAL

## 2012-07-23 MED ORDER — DEXTROSE 5 % IV SOLN: INTRAVENOUS | Status: DC | PRN

## 2012-07-23 MED ORDER — PHENYLEPHRINE HCL 10 MG/ML IJ SOLN
10.0000 mg | INTRAVENOUS | Status: DC | PRN
  Administered 2012-07-23: 10 ug/min via INTRAVENOUS

## 2012-07-23 MED ORDER — HEPARIN SODIUM (PORCINE) 1000 UNIT/ML IJ SOLN
INTRAMUSCULAR | Status: AC
  Filled 2012-07-23: qty 1

## 2012-07-23 MED ORDER — MILRINONE IN DEXTROSE 200-5 MCG/ML-% IV SOLN
0.3000 ug/kg/min | INTRAVENOUS | Status: DC
  Administered 2012-07-23: 0.3 ug/kg/min via INTRAVENOUS
  Filled 2012-07-23: qty 100

## 2012-07-23 MED ORDER — METOPROLOL TARTRATE 1 MG/ML IV SOLN: 2.5000 mg | INTRAVENOUS | Status: DC | PRN

## 2012-07-23 MED ORDER — SODIUM BICARBONATE 8.4 % IV SOLN
INTRAVENOUS | Status: AC
  Filled 2012-07-23: qty 50

## 2012-07-23 MED ORDER — HEPARIN SODIUM (PORCINE) 1000 UNIT/ML IJ SOLN
INTRAMUSCULAR | Status: DC | PRN
  Administered 2012-07-23: 32000 [IU] via INTRAVENOUS
  Administered 2012-07-23: 3000 [IU] via INTRAVENOUS

## 2012-07-23 MED ORDER — SODIUM CHLORIDE 0.9 % IJ SOLN
OROMUCOSAL | Status: DC | PRN
  Administered 2012-07-23: 10:00:00 via TOPICAL

## 2012-07-23 MED ORDER — DOCUSATE SODIUM 100 MG PO CAPS
200.0000 mg | ORAL_CAPSULE | Freq: Every day | ORAL | Status: DC
  Administered 2012-07-24 – 2012-07-25 (×2): 200 mg via ORAL
  Filled 2012-07-23 (×2): qty 2

## 2012-07-23 MED ORDER — VANCOMYCIN HCL IN DEXTROSE 1-5 GM/200ML-% IV SOLN
1000.0000 mg | Freq: Once | INTRAVENOUS | Status: AC
  Administered 2012-07-23: 1000 mg via INTRAVENOUS
  Filled 2012-07-23: qty 200

## 2012-07-23 MED ORDER — LIDOCAINE HCL (CARDIAC) 20 MG/ML IV SOLN
INTRAVENOUS | Status: AC
  Filled 2012-07-23: qty 5

## 2012-07-23 MED ORDER — LACTATED RINGERS IV SOLN
INTRAVENOUS | Status: DC
  Administered 2012-07-23: 20 mL/h via INTRAVENOUS

## 2012-07-23 MED ORDER — MORPHINE SULFATE 2 MG/ML IJ SOLN: 1.0000 mg | INTRAMUSCULAR | Status: AC | PRN

## 2012-07-23 MED ORDER — TRANEXAMIC ACID 100 MG/ML IV SOLN
2500.0000 mg | INTRAVENOUS | Status: DC | PRN
  Administered 2012-07-23: 1.5 mg/kg/h via INTRAVENOUS

## 2012-07-23 MED ORDER — MILRINONE IN DEXTROSE 200-5 MCG/ML-% IV SOLN
INTRAVENOUS | Status: DC | PRN
  Administered 2012-07-23: .3 ug/kg/min via INTRAVENOUS

## 2012-07-23 MED ORDER — ASPIRIN EC 325 MG PO TBEC
325.0000 mg | DELAYED_RELEASE_TABLET | Freq: Every day | ORAL | Status: DC
  Administered 2012-07-24 – 2012-07-25 (×2): 325 mg via ORAL
  Filled 2012-07-23 (×2): qty 1

## 2012-07-23 MED ORDER — DEXMEDETOMIDINE HCL 100 MCG/ML IV SOLN
200.0000 ug | INTRAVENOUS | Status: DC | PRN
  Administered 2012-07-23: 0.2 ug/kg/h via INTRAVENOUS

## 2012-07-23 MED ORDER — POTASSIUM CHLORIDE 10 MEQ/50ML IV SOLN
10.0000 meq | INTRAVENOUS | Status: AC
  Administered 2012-07-23 (×3): 10 meq via INTRAVENOUS

## 2012-07-23 MED ORDER — INSULIN ASPART 100 UNIT/ML ~~LOC~~ SOLN: 0.0000 [IU] | SUBCUTANEOUS | Status: DC

## 2012-07-23 MED ORDER — ACETAMINOPHEN 500 MG PO TABS
1000.0000 mg | ORAL_TABLET | Freq: Four times a day (QID) | ORAL | Status: DC
Start: 2012-07-23 — End: 2012-07-25
  Administered 2012-07-24 – 2012-07-25 (×5): 1000 mg via ORAL
  Filled 2012-07-23 (×10): qty 2

## 2012-07-23 MED ORDER — ACETAMINOPHEN 650 MG RE SUPP
650.0000 mg | RECTAL | Status: AC
  Administered 2012-07-23: 650 mg via RECTAL

## 2012-07-23 MED ORDER — PHENYLEPHRINE HCL 10 MG/ML IJ SOLN
0.0000 ug/min | INTRAVENOUS | Status: DC
  Administered 2012-07-24: 25 ug/min via INTRAVENOUS
  Filled 2012-07-23 (×2): qty 2

## 2012-07-23 MED ORDER — POTASSIUM CHLORIDE 10 MEQ/50ML IV SOLN
10.0000 meq | INTRAVENOUS | Status: AC
  Administered 2012-07-23 (×2): 10 meq via INTRAVENOUS

## 2012-07-23 MED ORDER — ETOMIDATE 2 MG/ML IV SOLN
INTRAVENOUS | Status: DC | PRN
  Administered 2012-07-23: 20 mg via INTRAVENOUS

## 2012-07-23 MED ORDER — CEFUROXIME SODIUM 1.5 G IJ SOLR
1.5000 g | Freq: Two times a day (BID) | INTRAMUSCULAR | Status: AC
  Administered 2012-07-23 – 2012-07-25 (×4): 1.5 g via INTRAVENOUS
  Filled 2012-07-23 (×5): qty 1.5

## 2012-07-23 MED ORDER — ALBUMIN HUMAN 5 % IV SOLN
250.0000 mL | INTRAVENOUS | Status: AC | PRN
  Administered 2012-07-23 – 2012-07-24 (×2): 250 mL via INTRAVENOUS

## 2012-07-23 MED ORDER — MIDAZOLAM HCL 2 MG/2ML IJ SOLN: 2.0000 mg | INTRAMUSCULAR | Status: DC | PRN

## 2012-07-23 MED ORDER — ACETAMINOPHEN 160 MG/5ML PO SOLN: 650.0000 mg | ORAL | Status: AC

## 2012-07-23 MED ORDER — LACTATED RINGERS IV SOLN
INTRAVENOUS | Status: DC | PRN
  Administered 2012-07-23: 06:00:00 via INTRAVENOUS

## 2012-07-23 MED ORDER — ACETAMINOPHEN 160 MG/5ML PO SOLN
975.0000 mg | Freq: Four times a day (QID) | ORAL | Status: DC
  Filled 2012-07-23: qty 40.6

## 2012-07-23 MED ORDER — SODIUM CHLORIDE 0.9 % IJ SOLN: 3.0000 mL | INTRAMUSCULAR | Status: DC | PRN

## 2012-07-23 MED ORDER — METOPROLOL TARTRATE 12.5 MG HALF TABLET
12.5000 mg | ORAL_TABLET | Freq: Two times a day (BID) | ORAL | Status: DC
  Administered 2012-07-24 – 2012-07-25 (×3): 12.5 mg via ORAL
  Filled 2012-07-23 (×5): qty 1

## 2012-07-23 MED ORDER — TRANEXAMIC ACID 100 MG/ML IV SOLN: 2500.0000 mg | INTRAVENOUS | Status: DC | PRN

## 2012-07-23 MED ORDER — OXYCODONE HCL 5 MG PO TABS
5.0000 mg | ORAL_TABLET | ORAL | Status: DC | PRN
  Administered 2012-07-24: 5 mg via ORAL
  Administered 2012-07-24 (×2): 10 mg via ORAL
  Administered 2012-07-24 (×2): 5 mg via ORAL
  Administered 2012-07-25: 10 mg via ORAL
  Filled 2012-07-23: qty 1
  Filled 2012-07-23 (×2): qty 2
  Filled 2012-07-23 (×3): qty 1
  Filled 2012-07-23: qty 2

## 2012-07-23 MED ORDER — SODIUM CHLORIDE 0.9 % IV SOLN
100.0000 [IU] | INTRAVENOUS | Status: DC | PRN
  Administered 2012-07-23: 1 [IU] via INTRAVENOUS

## 2012-07-23 MED ORDER — SODIUM CHLORIDE 0.9 % IV SOLN
INTRAVENOUS | Status: DC
  Filled 2012-07-23: qty 1

## 2012-07-23 MED ORDER — DOPAMINE-DEXTROSE 3.2-5 MG/ML-% IV SOLN
INTRAVENOUS | Status: DC | PRN
  Administered 2012-07-23: 3 ug/kg/min via INTRAVENOUS

## 2012-07-23 MED ORDER — DEXMEDETOMIDINE HCL IN NACL 200 MCG/50ML IV SOLN
0.1000 ug/kg/h | INTRAVENOUS | Status: DC
  Administered 2012-07-23: 0.5 ug/kg/h via INTRAVENOUS
  Filled 2012-07-23: qty 50

## 2012-07-23 MED ORDER — ASPIRIN 81 MG PO CHEW: 324.0000 mg | CHEWABLE_TABLET | Freq: Every day | ORAL | Status: DC

## 2012-07-23 MED ORDER — ONDANSETRON HCL 4 MG/2ML IJ SOLN: 4.0000 mg | Freq: Four times a day (QID) | INTRAMUSCULAR | Status: DC | PRN

## 2012-07-23 MED ORDER — INSULIN ASPART 100 UNIT/ML ~~LOC~~ SOLN
0.0000 [IU] | SUBCUTANEOUS | Status: AC
  Administered 2012-07-23 (×2): 2 [IU] via SUBCUTANEOUS

## 2012-07-23 MED ORDER — SODIUM CHLORIDE 0.9 % IV SOLN: 250.0000 mL | INTRAVENOUS | Status: DC

## 2012-07-23 MED ORDER — MIDAZOLAM HCL 5 MG/5ML IJ SOLN
INTRAMUSCULAR | Status: DC | PRN
  Administered 2012-07-23: 2 mg via INTRAVENOUS
  Administered 2012-07-23: 3 mg via INTRAVENOUS
  Administered 2012-07-23: 2 mg via INTRAVENOUS

## 2012-07-23 MED ORDER — ALBUTEROL SULFATE HFA 108 (90 BASE) MCG/ACT IN AERS
INHALATION_SPRAY | RESPIRATORY_TRACT | Status: DC | PRN
  Administered 2012-07-23 (×2): 4 via RESPIRATORY_TRACT

## 2012-07-23 MED ORDER — ALBUMIN HUMAN 5 % IV SOLN
INTRAVENOUS | Status: AC
  Filled 2012-07-23: qty 250

## 2012-07-23 MED ORDER — FENTANYL CITRATE 0.05 MG/ML IJ SOLN
INTRAMUSCULAR | Status: DC | PRN
  Administered 2012-07-23 (×5): 250 ug via INTRAVENOUS
  Administered 2012-07-23: 500 ug via INTRAVENOUS

## 2012-07-23 MED ORDER — DOPAMINE-DEXTROSE 3.2-5 MG/ML-% IV SOLN: 0.0000 ug/kg/min | INTRAVENOUS | Status: DC

## 2012-07-23 MED ORDER — NITROGLYCERIN IN D5W 200-5 MCG/ML-% IV SOLN
INTRAVENOUS | Status: DC | PRN
  Administered 2012-07-23: 16.6 ug/min via INTRAVENOUS

## 2012-07-23 MED ORDER — MAGNESIUM SULFATE 40 MG/ML IJ SOLN
4.0000 g | Freq: Once | INTRAMUSCULAR | Status: AC
  Administered 2012-07-23: 4 g via INTRAVENOUS
  Filled 2012-07-23: qty 100

## 2012-07-23 MED ORDER — MORPHINE SULFATE 2 MG/ML IJ SOLN
2.0000 mg | INTRAMUSCULAR | Status: DC | PRN
  Administered 2012-07-23: 2 mg via INTRAVENOUS
  Administered 2012-07-24: 4 mg via INTRAVENOUS
  Filled 2012-07-23: qty 1
  Filled 2012-07-23: qty 2

## 2012-07-23 MED ORDER — PANTOPRAZOLE SODIUM 40 MG PO TBEC: 40.0000 mg | DELAYED_RELEASE_TABLET | Freq: Every day | ORAL | Status: DC

## 2012-07-23 MED ORDER — SODIUM CHLORIDE 0.9 % IV SOLN
INTRAVENOUS | Status: DC
Start: 2012-07-23 — End: 2012-07-25

## 2012-07-23 MED ORDER — INSULIN ASPART 100 UNIT/ML ~~LOC~~ SOLN
0.0000 [IU] | SUBCUTANEOUS | Status: DC
  Administered 2012-07-24: 2 [IU] via SUBCUTANEOUS

## 2012-07-23 MED ORDER — BISACODYL 5 MG PO TBEC
10.0000 mg | DELAYED_RELEASE_TABLET | Freq: Every day | ORAL | Status: DC
  Administered 2012-07-24 – 2012-07-25 (×2): 10 mg via ORAL
  Filled 2012-07-23 (×2): qty 2

## 2012-07-23 MED ORDER — PROTAMINE SULFATE 10 MG/ML IV SOLN
INTRAVENOUS | Status: DC | PRN
  Administered 2012-07-23: 90 mg via INTRAVENOUS
  Administered 2012-07-23: 250 mg via INTRAVENOUS
  Administered 2012-07-23: 10 mg via INTRAVENOUS

## 2012-07-23 MED ORDER — LEVALBUTEROL HCL 0.63 MG/3ML IN NEBU
0.6300 mg | INHALATION_SOLUTION | Freq: Four times a day (QID) | RESPIRATORY_TRACT | Status: DC | PRN
  Administered 2012-07-25: 0.63 mg via RESPIRATORY_TRACT
  Filled 2012-07-23: qty 3

## 2012-07-23 MED ORDER — VECURONIUM BROMIDE 10 MG IV SOLR
INTRAVENOUS | Status: DC | PRN
  Administered 2012-07-23 (×3): 5 mg via INTRAVENOUS

## 2012-07-23 MED ORDER — FAMOTIDINE IN NACL 20-0.9 MG/50ML-% IV SOLN
20.0000 mg | Freq: Two times a day (BID) | INTRAVENOUS | Status: DC
  Administered 2012-07-23: 20 mg via INTRAVENOUS

## 2012-07-23 MED ORDER — BISACODYL 10 MG RE SUPP: 10.0000 mg | Freq: Every day | RECTAL | Status: DC

## 2012-07-23 MED ORDER — ROCURONIUM BROMIDE 100 MG/10ML IV SOLN
INTRAVENOUS | Status: DC | PRN
  Administered 2012-07-23: 100 mg via INTRAVENOUS

## 2012-07-23 MED ORDER — SODIUM CHLORIDE 0.9 % IJ SOLN
3.0000 mL | Freq: Two times a day (BID) | INTRAMUSCULAR | Status: DC
  Administered 2012-07-24 – 2012-07-25 (×3): 3 mL via INTRAVENOUS

## 2012-07-23 MED ORDER — NITROGLYCERIN IN D5W 200-5 MCG/ML-% IV SOLN: 0.0000 ug/min | INTRAVENOUS | Status: DC

## 2012-07-23 MED ORDER — CALCIUM CHLORIDE 10 % IV SOLN
1.0000 g | Freq: Once | INTRAVENOUS | Status: AC | PRN
  Filled 2012-07-23: qty 10

## 2012-07-23 SURGICAL SUPPLY — 158 items
ADAPTER CARDIO PERF ANTE/RETRO (ADAPTER) ×6 IMPLANT
APPLICATOR COTTON TIP 6IN STRL (MISCELLANEOUS) IMPLANT
APPLIER CLIP 9.375 MED OPEN (MISCELLANEOUS)
APPLIER CLIP 9.375 SM OPEN (CLIP)
ATTRACTOMAT 16X20 MAGNETIC DRP (DRAPES) ×6 IMPLANT
BAG DECANTER FOR FLEXI CONT (MISCELLANEOUS) ×3 IMPLANT
BANDAGE ELASTIC 4 VELCRO ST LF (GAUZE/BANDAGES/DRESSINGS) ×3 IMPLANT
BANDAGE ELASTIC 6 VELCRO ST LF (GAUZE/BANDAGES/DRESSINGS) ×3 IMPLANT
BANDAGE GAUZE ELAST BULKY 4 IN (GAUZE/BANDAGES/DRESSINGS) ×3 IMPLANT
BASKET HEART (ORDER IN 25'S) (MISCELLANEOUS) ×1
BASKET HEART (ORDER IN 25S) (MISCELLANEOUS) ×2 IMPLANT
BENZOIN TINCTURE PRP APPL 2/3 (GAUZE/BANDAGES/DRESSINGS) ×3 IMPLANT
BLADE STERNUM SYSTEM 6 (BLADE) ×6 IMPLANT
BLADE SURG 11 STRL SS (BLADE) ×9 IMPLANT
BLADE SURG ROTATE 9660 (MISCELLANEOUS) ×6 IMPLANT
CANISTER SUCTION 2500CC (MISCELLANEOUS) ×6 IMPLANT
CANN PRFSN 3/8X14X24FR PCFC (MISCELLANEOUS)
CANN PRFSN 3/8XCNCT ST RT ANG (MISCELLANEOUS)
CANNULA FEM VENOUS REMOTE 22FR (CANNULA) ×3 IMPLANT
CANNULA GUNDRY RCSP 15FR (MISCELLANEOUS) IMPLANT
CANNULA PRFSN 3/8X14X24FR PCFC (MISCELLANEOUS) IMPLANT
CANNULA PRFSN 3/8XCNCT RT ANG (MISCELLANEOUS) IMPLANT
CANNULA VEN MTL TIP RT (MISCELLANEOUS)
CANNULA VENNOUS METAL TIP 20FR (CANNULA) ×3 IMPLANT
CARDIOBLATE CARDIAC ABLATION (MISCELLANEOUS)
CATH CPB KIT OWEN (MISCELLANEOUS) ×3 IMPLANT
CATH FOLEY 2WAY SLVR  5CC 14FR (CATHETERS)
CATH FOLEY 2WAY SLVR 5CC 14FR (CATHETERS) IMPLANT
CATH ROBINSON RED A/P 18FR (CATHETERS) IMPLANT
CATH THORACIC 28FR (CATHETERS) IMPLANT
CATH THORACIC 28FR RT ANG (CATHETERS) IMPLANT
CATH THORACIC 36FR (CATHETERS) ×3 IMPLANT
CATH THORACIC 36FR RT ANG (CATHETERS) ×3 IMPLANT
CLIP APPLIE 9.375 MED OPEN (MISCELLANEOUS) IMPLANT
CLIP APPLIE 9.375 SM OPEN (CLIP) IMPLANT
CLIP FOGARTY SPRING 6M (CLIP) ×3 IMPLANT
CLIP TI MEDIUM 24 (CLIP) IMPLANT
CLIP TI WIDE RED SMALL 24 (CLIP) IMPLANT
CLOTH BEACON ORANGE TIMEOUT ST (SAFETY) ×3 IMPLANT
CLSR STERI-STRIP ANTIMIC 1/2X4 (GAUZE/BANDAGES/DRESSINGS) ×3 IMPLANT
CONN 1/2X1/2X1/2  BEN (MISCELLANEOUS) ×2
CONN 1/2X1/2X1/2 BEN (MISCELLANEOUS) ×4 IMPLANT
CONN 3/8X1/2 ST GISH (MISCELLANEOUS) ×12 IMPLANT
CONN Y 3/8X3/8X3/8  BEN (MISCELLANEOUS) ×1
CONN Y 3/8X3/8X3/8 BEN (MISCELLANEOUS) ×2 IMPLANT
CONNECTOR 1/2X3/8X1/2 3 WAY (MISCELLANEOUS) ×2
CONNECTOR 1/2X3/8X1/2 3WAY (MISCELLANEOUS) ×4 IMPLANT
COVER SURGICAL LIGHT HANDLE (MISCELLANEOUS) ×6 IMPLANT
CRADLE DONUT ADULT HEAD (MISCELLANEOUS) ×3 IMPLANT
DERMABOND ADVANCED (GAUZE/BANDAGES/DRESSINGS) ×1
DERMABOND ADVANCED .7 DNX12 (GAUZE/BANDAGES/DRESSINGS) ×2 IMPLANT
DEVICE CARDIOBLATE CARDIAC ABL (MISCELLANEOUS) IMPLANT
DRAIN CHANNEL 32F RND 10.7 FF (WOUND CARE) ×3 IMPLANT
DRAPE CARDIOVASCULAR INCISE (DRAPES) ×1
DRAPE SLUSH/WARMER DISC (DRAPES) ×3 IMPLANT
DRAPE SRG 135X102X78XABS (DRAPES) ×2 IMPLANT
DRSG COVADERM 4X14 (GAUZE/BANDAGES/DRESSINGS) ×3 IMPLANT
ELECT REM PT RETURN 9FT ADLT (ELECTROSURGICAL) ×6
ELECTRODE REM PT RTRN 9FT ADLT (ELECTROSURGICAL) ×4 IMPLANT
GLOVE BIO SURGEON STRL SZ 6 (GLOVE) ×15 IMPLANT
GLOVE BIO SURGEON STRL SZ 6.5 (GLOVE) ×6 IMPLANT
GLOVE BIO SURGEON STRL SZ7 (GLOVE) IMPLANT
GLOVE BIOGEL PI IND STRL 6 (GLOVE) ×2 IMPLANT
GLOVE BIOGEL PI IND STRL 6.5 (GLOVE) ×2 IMPLANT
GLOVE BIOGEL PI IND STRL 7.0 (GLOVE) ×6 IMPLANT
GLOVE BIOGEL PI INDICATOR 6 (GLOVE) ×1
GLOVE BIOGEL PI INDICATOR 6.5 (GLOVE) ×1
GLOVE BIOGEL PI INDICATOR 7.0 (GLOVE) ×3
GLOVE ORTHO TXT STRL SZ7.5 (GLOVE) ×12 IMPLANT
GOWN STRL NON-REIN LRG LVL3 (GOWN DISPOSABLE) ×27 IMPLANT
HEMOSTAT POWDER SURGIFOAM 1G (HEMOSTASIS) ×15 IMPLANT
HEMOSTAT SURGICEL 2X14 (HEMOSTASIS) ×3 IMPLANT
INSERT FOGARTY 61MM (MISCELLANEOUS) IMPLANT
INSERT FOGARTY XLG (MISCELLANEOUS) ×6 IMPLANT
KIT BASIN OR (CUSTOM PROCEDURE TRAY) ×6 IMPLANT
KIT DILATOR VASC 18G NDL (KITS) ×3 IMPLANT
KIT DRAINAGE VACCUM ASSIST (KITS) ×3 IMPLANT
KIT PAIN CUSTOM (MISCELLANEOUS) IMPLANT
KIT ROOM TURNOVER OR (KITS) ×3 IMPLANT
KIT SUCTION CATH 14FR (SUCTIONS) ×27 IMPLANT
KIT VASOVIEW 6 PRO VH 2400 (KITS) ×3 IMPLANT
KIT VASOVIEW W/TROCAR VH 2000 (KITS) ×3 IMPLANT
LEAD PACING MYOCARDI (MISCELLANEOUS) ×3 IMPLANT
LINE VENT (MISCELLANEOUS) ×3 IMPLANT
LOOP VESSEL SUPERMAXI WHITE (MISCELLANEOUS) ×3 IMPLANT
MARKER GRAFT CORONARY BYPASS (MISCELLANEOUS) ×12 IMPLANT
MATRIX HEMOSTAT SURGIFLO (HEMOSTASIS) ×3 IMPLANT
NS IRRIG 1000ML POUR BTL (IV SOLUTION) ×18 IMPLANT
PACK OPEN HEART (CUSTOM PROCEDURE TRAY) ×3 IMPLANT
PAD ARMBOARD 7.5X6 YLW CONV (MISCELLANEOUS) ×6 IMPLANT
PENCIL BUTTON HOLSTER BLD 10FT (ELECTRODE) ×6 IMPLANT
PUNCH AORTIC ROTATE 4.0MM (MISCELLANEOUS) ×3 IMPLANT
PUNCH AORTIC ROTATE 4.5MM 8IN (MISCELLANEOUS) IMPLANT
PUNCH AORTIC ROTATE 5MM 8IN (MISCELLANEOUS) IMPLANT
RING MITRAL MEMO 3D 26MM SMD26 (Prosthesis & Implant Heart) ×3 IMPLANT
SET CARDIOPLEGIA MPS 5001102 (MISCELLANEOUS) ×3 IMPLANT
SET IRRIG TUBING LAPAROSCOPIC (IRRIGATION / IRRIGATOR) ×6 IMPLANT
SOLUTION ANTI FOG 6CC (MISCELLANEOUS) ×3 IMPLANT
SPONGE GAUZE 4X4 12PLY (GAUZE/BANDAGES/DRESSINGS) ×6 IMPLANT
SPONGE LAP 18X18 X RAY DECT (DISPOSABLE) ×3 IMPLANT
SPONGE LAP 4X18 X RAY DECT (DISPOSABLE) ×6 IMPLANT
SUCKER INTRACARDIAC WEIGHTED (SUCKER) ×6 IMPLANT
SURGIFLO W/THROMBIN 8M KIT (HEMOSTASIS) ×6 IMPLANT
SUT BONE WAX W31G (SUTURE) ×3 IMPLANT
SUT ETHIBOND (SUTURE) ×9 IMPLANT
SUT ETHIBOND 2 0 SH (SUTURE) ×4 IMPLANT
SUT ETHIBOND 2 0 SH 36X2 (SUTURE) ×8 IMPLANT
SUT ETHIBOND 2 0 V4 (SUTURE) IMPLANT
SUT ETHIBOND 2 0V4 GREEN (SUTURE) IMPLANT
SUT ETHIBOND 2-0 RB-1 WHT (SUTURE) ×9 IMPLANT
SUT ETHIBOND 4 0 TF (SUTURE) IMPLANT
SUT ETHIBOND 5 0 C 1 30 (SUTURE) ×6 IMPLANT
SUT ETHIBOND X763 2 0 SH 1 (SUTURE) ×6 IMPLANT
SUT MNCRL AB 3-0 PS2 18 (SUTURE) ×6 IMPLANT
SUT MNCRL AB 4-0 PS2 18 (SUTURE) ×3 IMPLANT
SUT PDS AB 1 CTX 36 (SUTURE) ×6 IMPLANT
SUT PROLENE 2 0 SH DA (SUTURE) IMPLANT
SUT PROLENE 3 0 SH 1 (SUTURE) ×6 IMPLANT
SUT PROLENE 3 0 SH DA (SUTURE) ×9 IMPLANT
SUT PROLENE 3 0 SH1 36 (SUTURE) IMPLANT
SUT PROLENE 4 0 RB 1 (SUTURE) ×5
SUT PROLENE 4 0 SH DA (SUTURE) ×24 IMPLANT
SUT PROLENE 4-0 RB1 .5 CRCL 36 (SUTURE) ×10 IMPLANT
SUT PROLENE 5 0 C 1 36 (SUTURE) ×3 IMPLANT
SUT PROLENE 6 0 C 1 30 (SUTURE) ×6 IMPLANT
SUT PROLENE 7.0 RB 3 (SUTURE) ×27 IMPLANT
SUT PROLENE 8 0 BV175 6 (SUTURE) ×9 IMPLANT
SUT PROLENE BLUE 7 0 (SUTURE) ×6 IMPLANT
SUT PROLENE POLY MONO (SUTURE) IMPLANT
SUT SILK  1 MH (SUTURE) ×3
SUT SILK 1 MH (SUTURE) ×6 IMPLANT
SUT SILK 2 0 TIES 10X30 (SUTURE) ×3 IMPLANT
SUT SILK 4 0 TIE 10X30 (SUTURE) ×3 IMPLANT
SUT STEEL 6MS V (SUTURE) ×3 IMPLANT
SUT STEEL STERNAL CCS#1 18IN (SUTURE) ×6 IMPLANT
SUT STEEL SZ 6 DBL 3X14 BALL (SUTURE) IMPLANT
SUT VIC AB 1 CTX 36 (SUTURE)
SUT VIC AB 1 CTX36XBRD ANBCTR (SUTURE) IMPLANT
SUT VIC AB 2-0 CT1 27 (SUTURE)
SUT VIC AB 2-0 CT1 36 (SUTURE) ×3 IMPLANT
SUT VIC AB 2-0 CT1 TAPERPNT 27 (SUTURE) IMPLANT
SUT VIC AB 2-0 CTX 27 (SUTURE) IMPLANT
SUT VIC AB 2-0 UR6 27 (SUTURE) ×3 IMPLANT
SUT VIC AB 3-0 SH 27 (SUTURE)
SUT VIC AB 3-0 SH 27X BRD (SUTURE) IMPLANT
SUT VIC AB 3-0 X1 27 (SUTURE) IMPLANT
SUT VICRYL 4-0 PS2 18IN ABS (SUTURE) IMPLANT
SUTURE E-PAK OPEN HEART (SUTURE) ×3 IMPLANT
SYS ATRICLIP LAA EXCLUSION 35 (Clip) ×3 IMPLANT
SYSTEM SAHARA CHEST DRAIN ATS (WOUND CARE) ×6 IMPLANT
TAPE CLOTH SURG 4X10 WHT LF (GAUZE/BANDAGES/DRESSINGS) ×6 IMPLANT
TOWEL OR 17X24 6PK STRL BLUE (TOWEL DISPOSABLE) ×9 IMPLANT
TOWEL OR 17X26 10 PK STRL BLUE (TOWEL DISPOSABLE) ×9 IMPLANT
TRAY FOLEY IC TEMP SENS 14FR (CATHETERS) ×3 IMPLANT
TUBE SUCT INTRACARD DLP 20F (MISCELLANEOUS) ×3 IMPLANT
TUBING INSUFFLATION 10FT LAP (TUBING) ×6 IMPLANT
UNDERPAD 30X30 INCONTINENT (UNDERPADS AND DIAPERS) ×6 IMPLANT
WATER STERILE IRR 1000ML POUR (IV SOLUTION) ×12 IMPLANT

## 2012-07-23 NOTE — Transfer of Care (Signed)
Immediate Anesthesia Transfer of Care Note  Patient: Miguel York  Procedure(s) Performed: Procedure(s) (LRB): CORONARY ARTERY BYPASS GRAFTING (CABG) (N/A) MITRAL VALVE REPAIR (MVR) (N/A) CLIPPING OF ATRIAL APPENDAGE (Left)  Patient Location: ICU  Anesthesia Type: General  Level of Consciousness: Patient remains intubated per anesthesia plan  Airway & Oxygen Therapy: Patient connected to T-piece oxygen, Patient remains intubated per anesthesia plan and Patient placed on Ventilator (see vital sign flow sheet for setting)  Post-op Assessment: Report given to PACU RN, Post -op Vital signs reviewed and stable and Patient moving all extremities  Post vital signs: Reviewed and stable  Complications: No apparent anesthesia complications

## 2012-07-23 NOTE — Brief Op Note (Addendum)
07/15/2012 - 07/23/2012  12:33 PM  PATIENT:  Mardi Mainland  61 y.o. male  PRE-OPERATIVE DIAGNOSIS:  CAD with CHF, MR, TR  POST-OPERATIVE DIAGNOSIS:  CAD with CHF, MR, TR  PROCEDURE:    CORONARY ARTERY BYPASS GRAFTING x 4 (LIMA-LAD, SVG-D, SVG-OM2, SVG-PDA), EVH right leg  MITRAL VALVE REPAIR (26mm MEMO 3-D annuloplasty ring)  CLIPPING OF LEFT ATRIAL APPENDAGE  SURGEON:    Purcell Nails, MD  ASSISTANTS:  Coral Ceo, PA-C  ANESTHESIA:   Remonia Richter, MD  CROSSCLAMP TIME:   138'  CARDIOPULMONARY BYPASS TIME: 172'  FINDINGS:  Severe global LV dysfunction (EF 20%)  Dilated non-ischemic cardiomyopathy  Moderate mitral regurgitation (type I dysfunction - pure annular dilatation)  Good quality LIMA and fair quality SVG conduit  Good quality target vessels for grafting  No residual mitral regurgitation after valve repair  COMPLICATIONS: none  PATIENT DISPOSITION:   TO SICU IN STABLE CONDITION  OWEN,CLARENCE H 07/23/2012 2:25 PM

## 2012-07-23 NOTE — Anesthesia Preprocedure Evaluation (Signed)
Anesthesia Evaluation  Patient identified by MRN, date of birth, ID band Patient awake    Reviewed: Allergy & Precautions, H&P , NPO status , Patient's Chart, lab work & pertinent test results, reviewed documented beta blocker date and time   Airway Mallampati: I TM Distance: >3 FB Neck ROM: Full    Dental  (+) Dental Advisory Given   Pulmonary COPD COPD inhaler, Current Smoker,  breath sounds clear to auscultation        Cardiovascular hypertension, Pt. on medications and Pt. on home beta blockers + CAD and +CHF Rhythm:Regular Rate:Normal - Systolic murmurs    Neuro/Psych    GI/Hepatic negative GI ROS, Neg liver ROS,   Endo/Other  negative endocrine ROS  Renal/GU negative Renal ROS     Musculoskeletal   Abdominal   Peds  Hematology   Anesthesia Other Findings   Reproductive/Obstetrics                           Anesthesia Physical Anesthesia Plan  ASA: IV  Anesthesia Plan: General   Post-op Pain Management:    Induction: Intravenous  Airway Management Planned: Oral ETT  Additional Equipment: Arterial line, TEE and PA Cath  Intra-op Plan:   Post-operative Plan: Post-operative intubation/ventilation  Informed Consent:   Dental advisory given  Plan Discussed with: CRNA, Anesthesiologist and Surgeon  Anesthesia Plan Comments:         Anesthesia Quick Evaluation

## 2012-07-23 NOTE — Preoperative (Signed)
Beta Blockers   Reason not to administer Beta Blockers:Not Applicable 

## 2012-07-23 NOTE — Progress Notes (Signed)
  Echocardiogram Echocardiogram Transesophageal has been performed.  Miguel York 07/23/2012, 10:01 AM

## 2012-07-23 NOTE — Op Note (Addendum)
CARDIOTHORACIC SURGERY OPERATIVE NOTE  Date of Procedure:  07/23/2012  Preoperative Diagnosis:   Severe 3-vessel Coronary Artery Disease  Moderate Mitral Regurgitation  Postoperative Diagnosis: Same  Procedure:    Coronary Artery Bypass Grafting x 4   Left Internal Mammary Artery to Distal Left Anterior Descending Coronary Artery  Saphenous Vein Graft to Posterior Descending Coronary Artery  Saphenous Vein Graft to Second Obtuse Marginal Branch of Left Circumflex Coronary Artery  Saphenous Vein Graft to First Diagonal Branch of Left Anterior Descending Coronary Artery  Endoscopic Vein Harvest from Right Thigh and Lower Leg   Mitral Valve Repair  Miguel York 3D ring annuloplasty (size 26mm, catalog #[id number], serial [id number])   Clipping of Left Atrial Appendage    Surgeon: Salvatore Decent. Cornelius Moras, MD  Assistant: Coral Ceo, PA-C  Anesthesia: Remonia Richter, MD   Operative Findings:  Severe global LV dysfunction (EF 20%)   Dilated non-ischemic cardiomyopathy   Moderate mitral regurgitation (type I dysfunction - pure annular dilatation)   Good quality LIMA and fair quality SVG conduit   Good quality target vessels for grafting   No residual mitral regurgitation after valve repair           BRIEF CLINICAL NOTE AND INDICATIONS FOR SURGERY  Patient is a 61 year old male from [place], [place] with no previous cardiac history but risk factors including history of long-standing tobacco and alcohol abuse as well as a strong family history of coronary artery disease. The patient describes a slow gradual progression of symptoms of shortness of breath and fatigue. Initially the patient noted that he simply get tired easily and then he began to develop dyspnea on exertion. Symptoms progressed substantially over the last few months to the point where he began to get short of breath with minimal exertion and occasionally at rest. The patient also began to develop PND,  orthopnea, and bilateral lower extremity edema. He states that intermittently in the past he said fleeting episodes of mild substernal chest discomfort and occasional symptoms in one or both arms, but noninvasive been very severe nor persistent. Ultimately he presented to the emergency room at [facility] with the primary complaint of lower extremity edema.  He was noted to have small left pleural effusion as well as elevated probe ENP level. Chest x-ray demonstrated cardiomegaly with central venous congestion without frank edema. He was started on oral diuretic and nebulized bronchodilator therapy, and he was seen in followup at the [facility] office in [place].  He was started on Coreg and his Lasix dose was increased.  An echocardiogram was performed 07/06/2012 which demonstrated severe left ventricular dysfunction with ejection fraction estimated at 15%.  Patient's medications were further adjusted and he was brought in for elective left and right heart catheterization by Dr. Sanjuana Kava. This revealed severe three-vessel coronary artery disease with severe left ventricular dysfunction and moderate pulmonary hypertension.  The patient has been seen in consultation and counseled at length regarding the indications, risks and potential benefits of surgery.  All questions have been answered, and the patient provides full informed consent for the operation as described.     DETAILS OF THE OPERATIVE PROCEDURE  The patient is brought to the operating room on the above mentioned date and central monitoring was established by the anesthesia team including placement of Swan-Ganz catheter and radial arterial line. The patient is placed in the supine position on the operating table.  Intravenous antibiotics are administered. General endotracheal anesthesia is induced uneventfully. A Foley catheter is  placed.  Baseline transesophageal echocardiogram was performed.  Findings were notable for dilated  cardiomyopathy with severe global LV dysfunction.  There were no segmental wall motion abnormalities.  There was moderate mitral regurgitation with type I mitral valve dysfunction due to pure annular dilatation.  There was trace to mild tricuspid regurgitation with normal size RV and tricuspid annulus.  The patient's chest, abdomen, both groins, and both lower extremities are prepared and draped in a sterile manner. A time out procedure is performed.  A median sternotomy incision was performed and the left internal mammary artery is dissected from the chest wall and prepared for bypass grafting. The left internal mammary artery is notably good quality conduit. Simultaneously, saphenous vein is obtained from the patient's right thigh and leg using endoscopic vein harvest technique. The saphenous vein is notably fair quality conduit. After removal of the saphenous vein, the small surgical incisions in the lower extremity are closed with absorbable suture. Following systemic heparinization, the left internal mammary artery was transected distally noted to have excellent flow.  The pericardium is opened. The ascending aorta is normal in appearance. The right common femoral vein is cannulated using the Seldinger technique and a guidewire advanced into the right atrium using TEE guidance.  The patient is heparinized systemically and the right common femoral vein cannulated using a 22 Fr long femoral venous cannula.  The ascending aorta is cannulated for cardiopulmonary bypass.  Adequate heparinization is verified.      The entire pre-bypass portion of the operation was notable for stable hemodynamics.  Cardiopulmonary bypass was begun and the surface of the heart is inspected.  A second venous cannula is placed directly into the superior vena cava.  Distal target vessels are selected for coronary artery bypass grafting.  A cardioplegia cannula is placed in the ascending aorta.  A temperature probe was placed in  the interventricular septum.  The patient is cooled to 32C systemic temperature.  The aortic cross clamp is applied and cold blood cardioplegia is delivered initially in an antegrade fashion through the aortic root.  Iced saline slush is applied for topical hypothermia.  The initial cardioplegic arrest is rapid with early diastolic arrest.  Repeat doses of cardioplegia are administered intermittently throughout the entire cross clamp portion of the operation through the aortic root and through subsequently placed vein grafts in order to maintain completely flat electrocardiogram and septal myocardial temperature below 15C.  Myocardial protection was felt to be excellent.  The following distal coronary artery bypass grafts were performed:   The posterior descending branch of the right coronary artery was grafted using a reversed saphenous vein graft in an end-to-side fashion.  At the site of distal anastomosis the target vessel was fair to good quality and measured approximately 1.5 mm in diameter.  The second obtuse marginal branch of the left circumflex coronary artery was grafted using a reversed saphenous vein graft in an end-to-side fashion.  At the site of distal anastomosis the target vessel was good quality and measured approximately 1.5 mm in diameter.  The first diagonal branch of the left anterior descending coronary artery was grafted using a reversed saphenous vein graft in an end-to-side fashion.  At the site of distal anastomosis the target vessel was good quality and measured approximately 1.5 mm in diameter.  The distal left anterior coronary artery was grafted with the left internal mammary artery in an end-to-side fashion.  At the site of distal anastomosis the target vessel was good quality and measured approximately  2.0 mm in diameter.  The left atrial appendage was occluded using an Atricure left atrial appendage clip, size 35mm.  A left atriotomy incision was performed  through the interatrial groove and extended partially across the back wall of the left atrium after opening the oblique sinus inferiorly.  The mitral valve is exposed using a self-retaining retractor.  The mitral valve was inspected and notable for normal leaflets and subvalvular apparatus.  There was pure annular dilatation. .    Interrupted 2-0 Ethibond horizontal mattress sutures were placed circumferentially around the entire mitral annulus.  The valve was sized to accept a 26 mm annuloplasty ring based upon the overall surface area of the anterior leaflet as well as the distance between the anterior and posterior commissures and the vertical height of the anterior leaflet.    A Miguel York 3D annuloplasty ring (size 26mm, Catalog [id number], serial [id number]) was implanted uneventfully.    After completion of the valve repair the valve was tested with saline and appeared to be perfectly competant.  There was a broad, symmetrical line of coaptation between the anterior and posterior leaflets and no residual mitral regurgitation.  Rewarming was begun.  The atriotomy was closed using a 2-layer closure of running 3-0 Prolene suture after placing a sump drain across the mitral valve to serve as a left ventricular vent.  All proximal vein graft anastomoses were placed directly to the ascending aorta prior to removal of the aortic cross clamp.  The septal myocardial temperature rose rapidly after reperfusion of the left internal mammary artery graft.  The aortic cross clamp was removed after a total cross clamp time of 138 minutes.  All proximal and distal coronary anastomoses were inspected for hemostasis and appropriate graft orientation.  Epicardial pacing wires are fixed to the right ventricular outflow tract and to the right atrial appendage. The patient is rewarmed to 37C temperature. The aortic and left ventricular vents are removed.  The patient is weaned and disconnected from cardiopulmonary bypass.  The  patient's rhythm at separation from bypass was AV paced.  The patient was weaned from cardioplegic bypass on low dose milrinone and dopamine infusions. Total cardiopulmonary bypass time for the operation was 172 minutes.  Followup transesophageal echocardiogram performed after separation from bypass revealed a well-seated mitral annuloplasty ring and a mitral valve that was functioning normally and without any residual mitral regurgitation.  Left ventricular function was unchanged from preoperatively.  The aortic and superior vena cava cannula were removed uneventfully. Protamine was administered to reverse the anticoagulation. The femoral venous cannula was removed and manual pressure held on the groin for 30 minutes.  The mediastinum and pleural space were inspected for hemostasis and irrigated with saline solution. The mediastinum and both pleural spaces were drained using 4 chest tubes placed through separate stab incisions inferiorly.  The soft tissues anterior to the aorta were reapproximated loosely. The sternum is closed with double strength sternal wire. The soft tissues anterior to the sternum were closed in multiple layers and the skin is closed with a running subcuticular skin closure.  The post-bypass portion of the operation was notable for stable rhythm and hemodynamics.  The patient was transfused two packs platelets and 4 units fresh frozen plasma following reversal of heparin with protamine due to thrombocytopenia and coagulapathy.  The patient tolerated the procedure well and is transported to the surgical intensive care in stable condition. There are no intraoperative complications. All sponge instrument and needle counts are verified correct at completion of  the operation.      Salvatore Decent. Cornelius Moras MD 07/23/2012 3:10 PM

## 2012-07-23 NOTE — Anesthesia Postprocedure Evaluation (Signed)
Anesthesia Post Note  Patient: Miguel York  Procedure(s) Performed: Procedure(s) (LRB): CORONARY ARTERY BYPASS GRAFTING (CABG) (N/A) MITRAL VALVE REPAIR (MVR) (N/A) CLIPPING OF ATRIAL APPENDAGE (Left)  Anesthesia type: General  Patient location: ICU  Post pain: Pain level controlled  Post assessment: Post-op Vital signs reviewed  Last Vitals:  Filed Vitals:   07/23/12 0400  BP: 109/68  Pulse: 83  Temp: 36.7 C  Resp: 18    Post vital signs: stable  Level of consciousness: Patient remains intubated per anesthesia plan  Complications: No apparent anesthesia complications

## 2012-07-23 NOTE — Progress Notes (Signed)
Patient ID: Miguel York, male   DOB: 07-Feb-1951, 61 y.o.   MRN: [id number]  SICU evening rounds:  Hemodynamically stable on dopamine and milrinone, neo  CI= 3.1  Extubated and awake Urine output good CT output low CBC    Component Value Date/Time   WBC 11.0* 07/23/2012 1516   RBC 2.97* 07/23/2012 1516   HGB 9.6* 07/23/2012 1516   HCT 27.8* 07/23/2012 1516   PLT 89* 07/23/2012 1516   MCV 93.6 07/23/2012 1516   MCH 32.3 07/23/2012 1516   MCHC 34.5 07/23/2012 1516   RDW 13.4 07/23/2012 1516    BMET    Component Value Date/Time   NA 141 07/23/2012 1512   K 3.3* 07/23/2012 1512   CL 100 07/21/2012 0630   CO2 31 07/21/2012 0630   GLUCOSE 86 07/23/2012 1512   BUN 14 07/21/2012 0630   CREATININE 0.89 07/21/2012 0630   CREATININE 1.12 07/12/2012 0907   CALCIUM 8.8 07/21/2012 0630   GFRNONAA >90 07/21/2012 0630   GFRAA >90 07/21/2012 0630

## 2012-07-23 NOTE — Procedures (Signed)
Extubation Procedure Note  Patient Details:   Name: CLELL TRAHAN DOB: October 18, 1951 MRN: [id number]   Airway Documentation:  Airway (Active)     Airway 8 mm (Active)  Secured at (cm) 22 cm 07/23/2012  6:24 PM  Measured From Lips 07/23/2012  6:24 PM  Secured Location Right 07/23/2012  6:24 PM  Secured By Caron Presume Tape 07/23/2012  6:24 PM    Evaluation  O2 sats: stable throughout and currently acceptable Complications: No apparent complications Patient did tolerate procedure well. Bilateral Breath Sounds: Diminished   Yes  Positive Cuff Leak. NIF-20, VC 1.0  Antoine Poche 07/23/2012, 7:01 PM

## 2012-07-24 ENCOUNTER — Inpatient Hospital Stay (HOSPITAL_COMMUNITY): Payer: BC Managed Care – PPO

## 2012-07-24 LAB — PREPARE FRESH FROZEN PLASMA
Unit division: 0
Unit division: 0

## 2012-07-24 LAB — BASIC METABOLIC PANEL
CO2: 26 mEq/L (ref 19–32)
Chloride: 103 mEq/L (ref 96–112)
Creatinine, Ser: 0.77 mg/dL (ref 0.50–1.35)
Glucose, Bld: 164 mg/dL — ABNORMAL HIGH (ref 70–99)

## 2012-07-24 LAB — PREPARE PLATELET PHERESIS: Unit division: 0

## 2012-07-24 LAB — CBC
HCT: 28.6 % — ABNORMAL LOW (ref 39.0–52.0)
HCT: 30 % — ABNORMAL LOW (ref 39.0–52.0)
Hemoglobin: 9.7 g/dL — ABNORMAL LOW (ref 13.0–17.0)
MCHC: 34 g/dL (ref 30.0–36.0)
MCV: 95 fL (ref 78.0–100.0)
MCV: 94 fL (ref 78.0–100.0)
Platelets: 84 10*3/uL — ABNORMAL LOW (ref 150–400)
RBC: 3.01 MIL/uL — ABNORMAL LOW (ref 4.22–5.81)
RDW: 13.7 % (ref 11.5–15.5)
WBC: 10.8 10*3/uL — ABNORMAL HIGH (ref 4.0–10.5)
WBC: 13 10*3/uL — ABNORMAL HIGH (ref 4.0–10.5)

## 2012-07-24 LAB — POCT I-STAT, CHEM 8
BUN: 14 mg/dL (ref 6–23)
Calcium, Ion: 1.15 mmol/L (ref 1.12–1.23)
Chloride: 98 mEq/L (ref 96–112)
Creatinine, Ser: 0.7 mg/dL (ref 0.50–1.35)
Glucose, Bld: 139 mg/dL — ABNORMAL HIGH (ref 70–99)

## 2012-07-24 LAB — MAGNESIUM: Magnesium: 2.6 mg/dL — ABNORMAL HIGH (ref 1.5–2.5)

## 2012-07-24 LAB — GLUCOSE, CAPILLARY: Glucose-Capillary: 142 mg/dL — ABNORMAL HIGH (ref 70–99)

## 2012-07-24 LAB — CREATININE, SERUM: GFR calc Af Amer: >90 mL/min (ref >90)

## 2012-07-24 MED ORDER — INSULIN ASPART 100 UNIT/ML ~~LOC~~ SOLN: 0.0000 [IU] | SUBCUTANEOUS | Status: DC

## 2012-07-24 MED ORDER — POTASSIUM CHLORIDE CRYS ER 20 MEQ PO TBCR
40.0000 meq | EXTENDED_RELEASE_TABLET | Freq: Two times a day (BID) | ORAL | Status: AC
  Administered 2012-07-24 (×2): 40 meq via ORAL
  Filled 2012-07-24 (×2): qty 2

## 2012-07-24 MED ORDER — ENOXAPARIN SODIUM 40 MG/0.4ML ~~LOC~~ SOLN
40.0000 mg | SUBCUTANEOUS | Status: DC
  Administered 2012-07-24 – 2012-07-26 (×3): 40 mg via SUBCUTANEOUS
  Filled 2012-07-24 (×5): qty 0.4

## 2012-07-24 MED ORDER — LEVALBUTEROL HCL 0.63 MG/3ML IN NEBU
0.6300 mg | INHALATION_SOLUTION | Freq: Four times a day (QID) | RESPIRATORY_TRACT | Status: DC
  Administered 2012-07-24 – 2012-07-25 (×3): 0.63 mg via RESPIRATORY_TRACT
  Filled 2012-07-24 (×8): qty 3

## 2012-07-24 MED ORDER — INSULIN GLARGINE 100 UNIT/ML ~~LOC~~ SOLN
15.0000 [IU] | Freq: Every day | SUBCUTANEOUS | Status: DC
  Administered 2012-07-24 – 2012-07-25 (×2): 15 [IU] via SUBCUTANEOUS

## 2012-07-24 MED ORDER — FUROSEMIDE 10 MG/ML IJ SOLN
40.0000 mg | Freq: Two times a day (BID) | INTRAMUSCULAR | Status: AC
  Administered 2012-07-24 (×2): 40 mg via INTRAVENOUS

## 2012-07-24 NOTE — Progress Notes (Signed)
1 Day Post-Op Procedure(s) (LRB): CORONARY ARTERY BYPASS GRAFTING (CABG) (N/A) MITRAL VALVE REPAIR (MVR) (N/A) CLIPPING OF ATRIAL APPENDAGE (Left) Subjective: Sore, congestion in chest  Objective: Vital signs in last 24 hours: Temp:  [98.6 F (37 C)-101.7 F (38.7 C)] 98.6 F (37 C) (08/31 1045) Pulse Rate:  [84-109] 106  (08/31 1045) Cardiac Rhythm:  [-] Sinus tachycardia (08/31 0900) Resp:  [0-39] 23  (08/31 1045) BP: (84-118)/(49-69) 118/67 mmHg (08/31 1000) SpO2:  [96 %-100 %] 98 % (08/31 1045) Arterial Line BP: (88-143)/(33-65) 130/53 mmHg (08/31 1045) FiO2 (%):  [40 %-50 %] 40 % (08/30 1824) Weight:  [71.3 kg (157 lb 3 oz)-76.2 kg (167 lb 15.9 oz)] 76.2 kg (167 lb 15.9 oz) (08/31 0600)  Hemodynamic parameters for last 24 hours: PAP: (20-58)/(10-36) 40/20 mmHg CO:  [5.9 L/min-8.8 L/min] 8.4 L/min CI:  [3.1 L/min/m2-4.6 L/min/m2] 4.4 L/min/m2 dop 3, milrinone 0.3  Intake/Output from previous day: 08/30 0701 - 08/31 0700 In: 6953 [P.O.:540; I.V.:3060; Blood:2203; IV Piggyback:1150] Out: 4260 [Urine:2900; Blood:800; Chest Tube:560] Intake/Output this shift: Total I/O In: 451.9 [P.O.:220; I.V.:181.9; IV Piggyback:50] Out: 290 [Urine:260; Chest Tube:30]  General appearance: alert and cooperative Neurologic: intact Heart: regular rate and rhythm, S1, S2 normal, no murmur, click, rub or gallop Lungs: rhonchi bilaterally Extremities: edema mild Wound: dressing dry  Lab Results:  Basename 07/24/12 0350 07/23/12 2012 07/23/12 2010  WBC 10.8* -- 8.8  HGB 9.7* 9.9* --  HCT 28.6* 29.0* --  PLT 84* -- 106*   BMET:  Basename 07/24/12 0350 07/23/12 2012  NA 137 138  K 4.1 4.0  CL 103 104  CO2 26 --  GLUCOSE 164* 125*  BUN 13 12  CREATININE 0.77 0.90  CALCIUM 8.4 --    PT/INR:  Basename 07/23/12 1516  LABPROT 17.4*  INR 1.40   ABG    Component Value Date/Time   PHART 7.426 07/23/2012 2011   HCO3 26.8* 07/23/2012 2011   TCO2 24 07/23/2012 2012   ACIDBASEDEF  2.0 07/15/2012 1246   O2SAT 96.0 07/23/2012 2011   CBG (last 3)   Basename 07/24/12 0816 07/24/12 0350 07/24/12 0031  GLUCAP 147* 147* 142*    Assessment/Plan: S/P Procedure(s) (LRB): CORONARY ARTERY BYPASS GRAFTING (CABG) (N/A) MITRAL VALVE REPAIR (MVR) (N/A) CLIPPING OF ATRIAL APPENDAGE (Left) Mobilize Diuresis Diabetes control d/c tubes/lines Continue foley due to urinary output monitoring See progression orders   LOS: 9 days    Trenda Corliss K 07/24/2012

## 2012-07-24 NOTE — Progress Notes (Signed)
  Patient ID: Miguel York, male   DOB: 1951/10/10, 61 y.o.   MRN: [id number]  SICU evening rounds:  Hemodynamically stable off inotropes Urine output ok Continue IS, nebs  CBC    Component Value Date/Time   WBC 13.0* 07/24/2012 1700   RBC 3.19* 07/24/2012 1700   HGB 10.2* 07/24/2012 1700   HCT 30.0* 07/24/2012 1700   PLT 79* 07/24/2012 1700   MCV 94.0 07/24/2012 1700   MCH 32.0 07/24/2012 1700   MCHC 34.0 07/24/2012 1700   RDW 13.7 07/24/2012 1700    BMET    Component Value Date/Time   NA 135 07/24/2012 1654   K 4.0 07/24/2012 1654   CL 98 07/24/2012 1654   CO2 26 07/24/2012 0350   GLUCOSE 139* 07/24/2012 1654   BUN 14 07/24/2012 1654   CREATININE 0.91 07/24/2012 1700   CREATININE 1.12 07/12/2012 0907   CALCIUM 8.4 07/24/2012 0350   GFRNONAA >90 07/24/2012 1700   GFRAA >90 07/24/2012 1700

## 2012-07-25 ENCOUNTER — Inpatient Hospital Stay (HOSPITAL_COMMUNITY): Payer: BC Managed Care – PPO

## 2012-07-25 DIAGNOSIS — R7989 Other specified abnormal findings of blood chemistry: Secondary | ICD-10-CM

## 2012-07-25 DIAGNOSIS — D509 Iron deficiency anemia, unspecified: Secondary | ICD-10-CM

## 2012-07-25 DIAGNOSIS — E871 Hypo-osmolality and hyponatremia: Secondary | ICD-10-CM

## 2012-07-25 HISTORY — DX: Iron deficiency anemia, unspecified: D50.9

## 2012-07-25 HISTORY — DX: Hypo-osmolality and hyponatremia: E87.1

## 2012-07-25 HISTORY — DX: Other specified abnormal findings of blood chemistry: R79.89

## 2012-07-25 LAB — CBC
MCH: 32.1 pg (ref 26.0–34.0)
MCV: 94 fL (ref 78.0–100.0)
Platelets: 77 10*3/uL — ABNORMAL LOW (ref 150–400)
RBC: 3.15 MIL/uL — ABNORMAL LOW (ref 4.22–5.81)

## 2012-07-25 LAB — GLUCOSE, CAPILLARY
Glucose-Capillary: 135 mg/dL — ABNORMAL HIGH (ref 70–99)
Glucose-Capillary: 129 mg/dL — ABNORMAL HIGH (ref 70–99)
Glucose-Capillary: 125 mg/dL — ABNORMAL HIGH (ref 70–99)
Glucose-Capillary: 136 mg/dL — ABNORMAL HIGH (ref 70–99)
Glucose-Capillary: 142 mg/dL — ABNORMAL HIGH (ref 70–99)

## 2012-07-25 LAB — BASIC METABOLIC PANEL
CO2: 25 mEq/L (ref 19–32)
Calcium: 8.7 mg/dL (ref 8.4–10.5)
Creatinine, Ser: 0.92 mg/dL (ref 0.50–1.35)
Glucose, Bld: 132 mg/dL — ABNORMAL HIGH (ref 70–99)
Sodium: 131 mEq/L — ABNORMAL LOW (ref 135–145)

## 2012-07-25 MED ORDER — OXYCODONE HCL 5 MG PO TABS
5.0000 mg | ORAL_TABLET | ORAL | Status: DC | PRN
  Administered 2012-07-25 – 2012-07-28 (×7): 10 mg via ORAL
  Filled 2012-07-25 (×7): qty 2

## 2012-07-25 MED ORDER — TRAMADOL HCL 50 MG PO TABS
50.0000 mg | ORAL_TABLET | ORAL | Status: DC | PRN
  Administered 2012-07-25: 100 mg via ORAL
  Filled 2012-07-25: qty 2

## 2012-07-25 MED ORDER — SODIUM CHLORIDE 0.9 % IJ SOLN: 3.0000 mL | INTRAMUSCULAR | Status: DC | PRN

## 2012-07-25 MED ORDER — ASPIRIN EC 325 MG PO TBEC
325.0000 mg | DELAYED_RELEASE_TABLET | Freq: Every day | ORAL | Status: DC
  Administered 2012-07-26 – 2012-07-28 (×3): 325 mg via ORAL
  Filled 2012-07-25 (×3): qty 1

## 2012-07-25 MED ORDER — BISACODYL 5 MG PO TBEC: 10.0000 mg | DELAYED_RELEASE_TABLET | Freq: Every day | ORAL | Status: DC | PRN

## 2012-07-25 MED ORDER — PANTOPRAZOLE SODIUM 40 MG PO TBEC
40.0000 mg | DELAYED_RELEASE_TABLET | Freq: Every day | ORAL | Status: DC
  Administered 2012-07-25 – 2012-07-28 (×4): 40 mg via ORAL
  Filled 2012-07-25 (×4): qty 1

## 2012-07-25 MED ORDER — BISACODYL 10 MG RE SUPP: 10.0000 mg | Freq: Every day | RECTAL | Status: DC | PRN

## 2012-07-25 MED ORDER — FUROSEMIDE 10 MG/ML IJ SOLN
80.0000 mg | Freq: Once | INTRAMUSCULAR | Status: AC
  Administered 2012-07-25: 80 mg via INTRAVENOUS
  Filled 2012-07-25: qty 8

## 2012-07-25 MED ORDER — ONDANSETRON HCL 4 MG/2ML IJ SOLN: 4.0000 mg | Freq: Four times a day (QID) | INTRAMUSCULAR | Status: DC | PRN

## 2012-07-25 MED ORDER — DOCUSATE SODIUM 100 MG PO CAPS
200.0000 mg | ORAL_CAPSULE | Freq: Every day | ORAL | Status: DC
  Administered 2012-07-26 – 2012-07-28 (×3): 200 mg via ORAL
  Filled 2012-07-25 (×3): qty 2

## 2012-07-25 MED ORDER — ONDANSETRON HCL 4 MG PO TABS: 4.0000 mg | ORAL_TABLET | Freq: Four times a day (QID) | ORAL | Status: DC | PRN

## 2012-07-25 MED ORDER — SODIUM CHLORIDE 0.9 % IV SOLN: 250.0000 mL | INTRAVENOUS | Status: DC | PRN

## 2012-07-25 MED ORDER — POTASSIUM CHLORIDE CRYS ER 20 MEQ PO TBCR
20.0000 meq | EXTENDED_RELEASE_TABLET | Freq: Every day | ORAL | Status: DC
  Administered 2012-07-26: 20 meq via ORAL
  Filled 2012-07-25: qty 1

## 2012-07-25 MED ORDER — INSULIN ASPART 100 UNIT/ML ~~LOC~~ SOLN
0.0000 [IU] | Freq: Three times a day (TID) | SUBCUTANEOUS | Status: DC
  Administered 2012-07-25 – 2012-07-26 (×5): 2 [IU] via SUBCUTANEOUS

## 2012-07-25 MED ORDER — METOPROLOL TARTRATE 25 MG PO TABS
25.0000 mg | ORAL_TABLET | Freq: Two times a day (BID) | ORAL | Status: DC
  Administered 2012-07-25 – 2012-07-27 (×3): 25 mg via ORAL
  Filled 2012-07-25 (×5): qty 1

## 2012-07-25 MED ORDER — SODIUM CHLORIDE 0.9 % IJ SOLN
3.0000 mL | Freq: Two times a day (BID) | INTRAMUSCULAR | Status: DC
  Administered 2012-07-25 – 2012-07-27 (×6): 3 mL via INTRAVENOUS

## 2012-07-25 MED ORDER — MOVING RIGHT ALONG BOOK
Freq: Once | Status: AC
  Administered 2012-07-25: 14:00:00
  Filled 2012-07-25: qty 1

## 2012-07-25 NOTE — Progress Notes (Signed)
2 Days Post-Op Procedure(s) (LRB): CORONARY ARTERY BYPASS GRAFTING (CABG) (N/A) MITRAL VALVE REPAIR (MVR) (N/A) CLIPPING OF ATRIAL APPENDAGE (Left) Subjective: Complains of difficulty eating without teeth. Otherwise no complaints  Objective: Vital signs in last 24 hours: Temp:  [97.8 F (36.6 C)-98.6 F (37 C)] 97.8 F (36.6 C) (09/01 0803) Pulse Rate:  [84-109] 87  (09/01 0800) Cardiac Rhythm:  [-] Normal sinus rhythm (09/01 0800) Resp:  [15-35] 15  (09/01 0800) BP: (91-123)/(63-78) 123/78 mmHg (09/01 0800) SpO2:  [94 %-98 %] 97 % (09/01 0800) Arterial Line BP: (112-132)/(48-61) 132/61 mmHg (08/31 1100) Weight:  [77.3 kg (170 lb 6.7 oz)] 77.3 kg (170 lb 6.7 oz) (09/01 0500)  Hemodynamic parameters for last 24 hours: PAP: (38-47)/(17-21) 38/18 mmHg  Intake/Output from previous day: 08/31 0701 - 09/01 0700 In: 954.2 [P.O.:510; I.V.:386.2; IV Piggyback:58] Out: 1270 [Urine:1140; Chest Tube:130] Intake/Output this shift: Total I/O In: -  Out: 60 [Urine:60]  General appearance: alert and cooperative Heart: regular rate and rhythm, S1, S2 normal, no murmur, click, rub or gallop Lungs: clear to auscultation bilaterally Extremities: edema mild Wound: incision ok  Lab Results:  Basename 07/25/12 0401 07/24/12 1700  WBC 13.4* 13.0*  HGB 10.1* 10.2*  HCT 29.6* 30.0*  PLT 77* 79*   BMET:  Basename 07/25/12 0401 07/24/12 1700 07/24/12 1654 07/24/12 0350  NA 131* -- 135 --  K 4.5 -- 4.0 --  CL 97 -- 98 --  CO2 25 -- -- 26  GLUCOSE 132* -- 139* --  BUN 17 -- 14 --  CREATININE 0.92 0.91 -- --  CALCIUM 8.7 -- -- 8.4    PT/INR:  Basename 07/23/12 1516  LABPROT 17.4*  INR 1.40   ABG    Component Value Date/Time   PHART 7.426 07/23/2012 2011   HCO3 26.8* 07/23/2012 2011   TCO2 22 07/24/2012 1654   ACIDBASEDEF 2.0 07/15/2012 1246   O2SAT 96.0 07/23/2012 2011   CBG (last 3)   Basename 07/25/12 0341 07/24/12 2310 07/24/12 1930  GLUCAP 129* 135* 148*   CXR: minimal  bibasilar atel  Assessment/Plan: S/P Procedure(s) (LRB): CORONARY ARTERY BYPASS GRAFTING (CABG) (N/A) MITRAL VALVE REPAIR (MVR) (N/A) CLIPPING OF ATRIAL APPENDAGE (Left) Mobilize Diuresis Thrombocytopenia: stable Diabetes control: Hgb A1c was 5.8 preop. Continue SSI Not a coumadin candidate  Plan for transfer to step-down: see transfer orders   LOS: 10 days    BARTLE,BRYAN K 07/25/2012

## 2012-07-26 LAB — GLUCOSE, CAPILLARY
Glucose-Capillary: 131 mg/dL — ABNORMAL HIGH (ref 70–99)
Glucose-Capillary: 100 mg/dL — ABNORMAL HIGH (ref 70–99)
Glucose-Capillary: 140 mg/dL — ABNORMAL HIGH (ref 70–99)

## 2012-07-26 LAB — BASIC METABOLIC PANEL
BUN: 20 mg/dL (ref 6–23)
Chloride: 95 mEq/L — ABNORMAL LOW (ref 96–112)
GFR calc Af Amer: >90 mL/min (ref >90)
GFR calc non Af Amer: >90 mL/min (ref >90)
Potassium: 3.6 mEq/L (ref 3.5–5.1)
Sodium: 128 mEq/L — ABNORMAL LOW (ref 135–145)

## 2012-07-26 LAB — CBC
HCT: 26.3 % — ABNORMAL LOW (ref 39.0–52.0)
MCHC: 35 g/dL (ref 30.0–36.0)
Platelets: 83 10*3/uL — ABNORMAL LOW (ref 150–400)
RDW: 13.6 % (ref 11.5–15.5)
WBC: 8.9 10*3/uL (ref 4.0–10.5)

## 2012-07-26 MED ORDER — POTASSIUM CHLORIDE CRYS ER 20 MEQ PO TBCR
40.0000 meq | EXTENDED_RELEASE_TABLET | Freq: Every day | ORAL | Status: DC
  Administered 2012-07-26 – 2012-07-28 (×3): 40 meq via ORAL
  Filled 2012-07-26 (×3): qty 2

## 2012-07-26 MED ORDER — FUROSEMIDE 40 MG PO TABS
40.0000 mg | ORAL_TABLET | Freq: Every day | ORAL | Status: DC
  Administered 2012-07-26 – 2012-07-28 (×3): 40 mg via ORAL
  Filled 2012-07-26 (×3): qty 1

## 2012-07-26 MED FILL — Dexmedetomidine HCl IV Soln 200 MCG/2ML: INTRAVENOUS | Qty: 2 | Status: AC

## 2012-07-26 MED FILL — Magnesium Sulfate Inj 50%: INTRAMUSCULAR | Qty: 10 | Status: AC

## 2012-07-26 MED FILL — Potassium Chloride Inj 2 mEq/ML: INTRAVENOUS | Qty: 40 | Status: AC

## 2012-07-26 NOTE — Progress Notes (Addendum)
3 Days Post-Op Procedure(s) (LRB): CORONARY ARTERY BYPASS GRAFTING (CABG) (N/A) MITRAL VALVE REPAIR (MVR) (N/A) CLIPPING OF ATRIAL APPENDAGE (Left)  Subjective: Miguel York feels much better this morning.  He states he reflux was bothering him yesterday and its much better today.  He is ambulating +BM  Objective: Vital signs in last 24 hours: Temp:  [97.5 F (36.4 C)-98.9 F (37.2 C)] 98.2 F (36.8 C) (09/02 0419) Pulse Rate:  [77-92] 90  (09/02 0419) Cardiac Rhythm:  [-] Normal sinus rhythm (09/01 1920) Resp:  [18-26] 18  (09/02 0419) BP: (95-123)/(61-72) 95/61 mmHg (09/02 0419) SpO2:  [95 %-100 %] 95 % (09/02 0419) Weight:  [167 lb 6.4 oz (75.932 kg)] 167 lb 6.4 oz (75.932 kg) (09/02 0419)   Intake/Output from previous day: 09/01 0701 - 09/02 0700 In: 588 [P.O.:480; IV Piggyback:108] Out: 1661 [Urine:1660; Stool:1]  General appearance: alert, cooperative and no distress Heart: regular rate and rhythm Lungs: clear to auscultation bilaterally Abdomen: soft, non-tender; bowel sounds normal; no masses,  no organomegaly Extremities: edema trace Wound: clean and dry  Lab Results:  Basename 07/26/12 0519 07/25/12 0401  WBC 8.9 13.4*  HGB 9.2* 10.1*  HCT 26.3* 29.6*  PLT 83* 77*   BMET:  Basename 07/26/12 0519 07/25/12 0401  NA 128* 131*  K 3.6 4.5  CL 95* 97  CO2 26 25  GLUCOSE 146* 132*  BUN 20 17  CREATININE 0.77 0.92  CALCIUM 8.6 8.7    PT/INR:  Basename 07/23/12 1516  LABPROT 17.4*  INR 1.40   ABG    Component Value Date/Time   PHART 7.426 07/23/2012 2011   HCO3 26.8* 07/23/2012 2011   TCO2 22 07/24/2012 1654   ACIDBASEDEF 2.0 07/15/2012 1246   O2SAT 96.0 07/23/2012 2011   CBG (last 3)   Basename 07/26/12 0616 07/25/12 2036 07/25/12 1615  GLUCAP 131* 142* 136*    Assessment/Plan: S/P Procedure(s) (LRB): CORONARY ARTERY BYPASS GRAFTING (CABG) (N/A) MITRAL VALVE REPAIR (MVR) (N/A) CLIPPING OF ATRIAL APPENDAGE (Left)  1. CV- NSR on lopressor 2.  Pulm- no acute issues, off oxygen continue bipap nightly 3. Volume Status- weight is up 10lbs from preop, will start Lasix 20mg  BID 4. Acute post operative anemia stable 5. CBGs- mildly elevated, patient not a diabetic, will d/c fingersticks once blood sugars back under control 6. Dispo- patient is progressing, will d/c EPW in AM if maintains NSR, possibly stable for d/c Wednesday   LOS: 11 days    Lowella Dandy 07/26/2012  Chart reviewed, patient examined, agree with above.

## 2012-07-26 NOTE — Progress Notes (Signed)
Subjective:  Feels good and slowly better.  No chest pain.  Using IS.    Objective:  Vital Signs in the last 24 hours: Temp:  [97.5 F (36.4 C)-98.9 F (37.2 C)] 98.2 F (36.8 C) (09/02 0419) Pulse Rate:  [77-92] 90  (09/02 0419) Resp:  [18-24] 18  (09/02 0419) BP: (95-123)/(61-72) 95/61 mmHg (09/02 0419) SpO2:  [95 %-99 %] 95 % (09/02 0419) Weight:  [167 lb 6.4 oz (75.932 kg)] 167 lb 6.4 oz (75.932 kg) (09/02 0419)  Intake/Output from previous day: 09/01 0701 - 09/02 0700 In: 588 [P.O.:480; IV Piggyback:108] Out: 1661 [Urine:1660; Stool:1]   Physical Exam: General: Well developed, well nourished, in no acute distress. Head:  Normocephalic and atraumatic. Lungs: Clear to auscultation and percussion. Heart: Normal S1 and S2.  S4 and ?S3 gallop Extremities: No clubbing or cyanosis. No edema. Neurologic: Alert and oriented x 3.    Lab Results:  Basename 07/26/12 0519 07/25/12 0401  WBC 8.9 13.4*  HGB 9.2* 10.1*  PLT 83* 77*    Basename 07/26/12 0519 07/25/12 0401  NA 128* 131*  K 3.6 4.5  CL 95* 97  CO2 26 25  GLUCOSE 146* 132*  BUN 20 17  CREATININE 0.77 0.92   No results found for this basename: TROPONINI:2,CK,MB:2 in the last 72 hours Hepatic Function Panel No results found for this basename: PROT,ALBUMIN,AST,ALT,ALKPHOS,BILITOT,BILIDIR,IBILI in the last 72 hours No results found for this basename: CHOL in the last 72 hours No results found for this basename: PROTIME in the last 72 hours  Imaging: Dg Chest Portable 1 View In Am  07/25/2012  *RADIOLOGY REPORT*  Clinical Data: Open heart surgery  PORTABLE CHEST - 1 VIEW  Comparison:   the previous day's study  Findings: The Swan-Ganz catheter has been removed, leaving the IJ venous sheath in place. Bilateral chest tubes have been removed, with no pneumothorax.  Changes of CABG, valve surgery, and left atrial clip. No definite effusion.  Mild bibasilar atelectasis, left greater than right, slightly increased.   Stable cardiomegaly.  IMPRESSION:  1.  Bilateral chest tube removal without pneumothorax.   Original Report Authenticated By: Thora Lance III, M.D.     EKG:  Telemetry shows 8 beats of NSVT.      Assessment/Plan:  Patient Active Hospital Problem List: S/P mitral valve repair (07/23/2012)   Assessment: no murmur on exam   Plan: stable  COPD (chronic obstructive pulmonary disease) (07/06/2012)   Assessment: lungs fairly clear   Plan: IS Cardiomyopathy (07/06/2012)   Assessment: stable on regimen. BP borderline   Plan: add Losartan 25mg  to regimen tomorrow if BP stable and monitor BP.   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

## 2012-07-27 ENCOUNTER — Encounter (HOSPITAL_COMMUNITY): Payer: Self-pay | Admitting: Thoracic Surgery (Cardiothoracic Vascular Surgery)

## 2012-07-27 DIAGNOSIS — Z9889 Other specified postprocedural states: Secondary | ICD-10-CM

## 2012-07-27 LAB — GLUCOSE, CAPILLARY
Glucose-Capillary: 105 mg/dL — ABNORMAL HIGH (ref 70–99)
Glucose-Capillary: 141 mg/dL — ABNORMAL HIGH (ref 70–99)

## 2012-07-27 MED ORDER — ASPIRIN 325 MG PO TBEC: 325.0000 mg | DELAYED_RELEASE_TABLET | Freq: Every day | ORAL | Status: DC

## 2012-07-27 MED ORDER — LISINOPRIL 2.5 MG PO TABS
2.5000 mg | ORAL_TABLET | Freq: Every day | ORAL | Status: DC
  Administered 2012-07-28: 2.5 mg via ORAL
  Filled 2012-07-27: qty 1

## 2012-07-27 MED ORDER — LISINOPRIL 2.5 MG PO TABS: 2.5000 mg | ORAL_TABLET | Freq: Every day | ORAL | Status: DC

## 2012-07-27 MED ORDER — FUROSEMIDE 40 MG PO TABS: 40.0000 mg | ORAL_TABLET | Freq: Every day | ORAL | Status: DC

## 2012-07-27 MED ORDER — CARVEDILOL 12.5 MG PO TABS: 12.5000 mg | ORAL_TABLET | Freq: Two times a day (BID) | ORAL | Status: DC

## 2012-07-27 MED ORDER — POTASSIUM CHLORIDE CRYS ER 20 MEQ PO TBCR: 20.0000 meq | EXTENDED_RELEASE_TABLET | Freq: Every day | ORAL | Status: DC

## 2012-07-27 MED ORDER — METOPROLOL TARTRATE 25 MG PO TABS: 25.0000 mg | ORAL_TABLET | Freq: Two times a day (BID) | ORAL | Status: DC

## 2012-07-27 MED ORDER — OXYCODONE HCL 5 MG PO TABS: 5.0000 mg | ORAL_TABLET | ORAL | Status: DC | PRN

## 2012-07-27 MED ORDER — CARVEDILOL 12.5 MG PO TABS
12.5000 mg | ORAL_TABLET | Freq: Two times a day (BID) | ORAL | Status: DC
  Administered 2012-07-28: 12.5 mg via ORAL
  Filled 2012-07-27 (×4): qty 1

## 2012-07-27 NOTE — Progress Notes (Addendum)
                    [address]            [place] [address]          [phone]     4 Days Post-Op Procedure(s) (LRB): CORONARY ARTERY BYPASS GRAFTING (CABG) (N/A) MITRAL VALVE REPAIR (MVR) (N/A) CLIPPING OF ATRIAL APPENDAGE (Left)  Subjective: Feels well, no complaints.   Objective: Vital signs in last 24 hours: Patient Vitals for the past 24 hrs:  BP Temp Temp src Pulse Resp SpO2 Weight  07/27/12 0312 95/62 mmHg 98.3 F (36.8 C) Oral 90  20  97 % 168 lb 14 oz (76.6 kg)  07/26/12 2015 99/62 mmHg 99.1 F (37.3 C) Oral 99  18  95 % -  07/26/12 1415 93/64 mmHg 98.5 F (36.9 C) Oral 87  18  96 % -   Current Weight  07/27/12 168 lb 14 oz (76.6 kg)   Pre-op wt= 71.3 kg  Intake/Output from previous day: 09/02 0701 - 09/03 0700 In: 600 [P.O.:600] Out: -     PHYSICAL EXAM:  Heart: RRR Lungs: coarse BS bilaterally  Wound: clean and dry Extremities: mild LE edema, R>L    Lab Results: CBC: Basename 07/26/12 0519 07/25/12 0401  WBC 8.9 13.4*  HGB 9.2* 10.1*  HCT 26.3* 29.6*  PLT 83* 77*   BMET:  Basename 07/26/12 0519 07/25/12 0401  NA 128* 131*  K 3.6 4.5  CL 95* 97  CO2 26 25  GLUCOSE 146* 132*  BUN 20 17  CREATININE 0.77 0.92  CALCIUM 8.6 8.7    PT/INR: No results found for this basename: LABPROT,INR in the last 72 hours    Assessment/Plan: S/P Procedure(s) (LRB): CORONARY ARTERY BYPASS GRAFTING (CABG) (N/A) MITRAL VALVE REPAIR (MVR) (N/A) CLIPPING OF ATRIAL APPENDAGE (Left) CV- SBPs low normal.  Will continue low dose beta blocker and watch. Vol overload- diurese. Expected post op blood loss anemia- stable, improving. D/c EPW, continue CRPI, pulm toilet. Possibly home in am if remains stable.   LOS: 12 days    Aguiniga,GINA H 07/27/2012   I have seen and examined the patient and agree with the assessment and plan as outlined.  Doing very well.  Hung Rhinesmith H 07/27/2012 8:34 AM

## 2012-07-27 NOTE — Progress Notes (Signed)
[id number] Cardiac Rehab Pt just back from walk. Started discharge education with pt. Discussed CHF, smoking cessation and Outpt. CRP. Pt. Given tips for quitting and telephone numbers for coaching line.He agrees to [employer]. CRP in [place], will send referral.Encou[place]rd him to watch CHF and Recovery after [facility] videos.

## 2012-07-27 NOTE — Discharge Summary (Signed)
I agree with the above discharge summary and plan for follow-up.  Miguel York H  

## 2012-07-27 NOTE — Progress Notes (Signed)
EPWs and CT sutures DC'd per order and unit protocol.  All tips intact, sites painted, Pt tolerated very well.  Steri's applied to CT sites x3 with 4th site covered DDI due to drainage.  VSS, set for Q15, MT aware, will monitor closely.  Bedrest for 1 hour.  Call bell in reach.

## 2012-07-27 NOTE — Discharge Summary (Addendum)
[address]            [place] [address]          [phone]         Discharge Summary  Name: Miguel York DOB: Oct 16, 1951 61 y.o. MRN: [id number]   Admission Date: 07/15/2012 Discharge Date: 07/28/2012    Admitting Diagnosis:  Acute on chronic systolic CHF  Severe left ventricular systolic dysfunction, EF 15% by echo    Discharge Diagnosis:  Severe 3-vessel Coronary Artery Disease   Moderate Mitral Regurgitation  Severe left ventricular systolic dysfunction, EF 15% by echo  Expected postoperative blood loss anemia  Chronic periodontitis  Acute on chronic systolic CHF  Tobacco abuse  Heavy alcohol abuse  Past Medical History  Diagnosis Date  . COPD (chronic obstructive pulmonary disease)   . Congestive heart failure 07/15/2012  . Coronary artery disease 07/15/2012  . 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 S/P mitral valve repair 07/23/2012    26 mm Sorin Memo 3D ring annuloplasty        Procedures:  MULTIPLE DENTAL EXTRACTIONS WITH ALVEOLOPLASTY - 07/20/2012  CORONARY ARTERY BYPASS GRAFTING x 4(Left Internal Mammary Artery to Distal Left Anterior Descending, Saphenous Vein Graft to Posterior Descending,  Saphenous Vein Graft to Second Obtuse Marginal, Saphenous Vein Graft to First Diagonal), Endoscopic vein harvest right leg  MITRAL VALVE REPAIR (26mm Sorin Memo 3D annuloplasty ring)  CLIPPING OF LEFT ATRIAL APPENDAGE  - 07/23/2012      HPI:  The patient is a 61 y.o. male with no previous cardiac history who presented to the ER at [facility] recently with complaints of bilateral lower extremity edema.  He reports a gradual progression of shortness of breath and fatigue, both with exertion and at rest.  He now also has PND and orthopnea, as well as transient episodes of mild substernal chest discomfort.  He was noted to have a small  left pleural effusion, as well as an elevated pro-BNP level. Chest x-ray demonstrated cardiomegaly with central venous congestion without frank edema. He was started on oral diuretics and nebulized bronchodilator therapy, and he was seen in followup at the [facility] office in [place]. He was started on Coreg and his Lasix dose was increased. An echocardiogram was performed 07/06/2012 which demonstrated severe left ventricular dysfunction with ejection fraction estimated at 15%. Cardiac catheterization was recommended, and this was performed on 07/15/2012 by Dr. Clifton James.  This revealed severe three-vessel coronary artery disease with severe left ventricular dysfunction and moderate pulmonary hypertension. The patient was subsequently admitted for further evaluation and treatment.      Hospital Course:  The patient was admitted to [facility] on 07/15/2012. A cardiac surgery consultation was requested, and the patient was seen by Dr. Cornelius Moras.  Cath films and echo were reviewed, confirming severe CAD, with at least moderate functional mitral regurgitation and at least moderate functional tricuspid regurgitation (type I dysfunction). He then had a transesophageal echocardiogram, which confirmed a severely depressed ejection fraction, moderate to severe mitral regurgitation, and mild tricuspid regurgitation. Elective revascularization was recommended, as well as possible concomitant mitral and/or tricuspid valve repair.  All risks, benefits and alternatives of surgery were explained in detail, and the patient agreed to proceed.   A pre-op dental  consult was performed by Dr. Kristin Bruins, and the patient was noted to have multiple missing teeth, as well as chronic periodontitis.  Dental extractions were performed on 07/20/2012 (please see op note for complete details).  The patient tolerated the procedure well. After a brief recovery period, the patient was deemed ready to proceed with cardiac surgery.  The  patient was taken to the operating room and underwent the above procedure.    The postoperative course has generally been uneventful.  He remains volume overloaded, and is being diuresed with Lasix, to which he is responding well.  He has been started on a beta blocker and an ACE-inhibitor.  He has had an expected post op blood loss anemia, which has not required transfusion.  He is ambulating in the halls, and is tolerating a soft diet.  His incisions are healing well, and his mouth is stable from his dental procedure.  We anticipate discharge home in 24-48 hours provided he remains stable.     Recent vital signs:  Filed Vitals:   07/27/12 0844  BP: 104/59  Pulse: 90  Temp:   Resp:     Recent laboratory studies:  CBC: Basename 07/26/12 0519 07/25/12 0401  WBC 8.9 13.4*  HGB 9.2* 10.1*  HCT 26.3* 29.6*  PLT 83* 77*   BMET:  Basename 07/26/12 0519 07/25/12 0401  NA 128* 131*  K 3.6 4.5  CL 95* 97  CO2 26 25  GLUCOSE 146* 132*  BUN 20 17  CREATININE 0.77 0.92  CALCIUM 8.6 8.7    PT/INR: No results found for this basename: LABPROT,INR in the last 72 hours   Discharge Medications:   Medication List  As of 07/27/2012  4:09 PM   STOP taking these medications         aspirin 81 MG tablet      spironolactone 25 MG tablet         TAKE these medications         albuterol (2.5 MG/3ML) 0.083% nebulizer solution   Commonly known as: PROVENTIL   Take 2.5 mg by nebulization daily as needed.      albuterol 108 (90 BASE) MCG/ACT inhaler   Commonly known as: PROVENTIL HFA;VENTOLIN HFA   Inhale 2 puffs into the lungs as needed.      aspirin 325 MG EC tablet   Take 1 tablet (325 mg total) by mouth daily.      carvedilol 12.5 MG tablet   Commonly known as: COREG   Take 1 tablet (12.5 mg total) by mouth 2 (two) times daily with a meal.      fish oil-omega-3 fatty acids 1000 MG capsule   Take 1.2 g by mouth daily.      furosemide 40 MG tablet   Commonly known as: LASIX    Take 1 tablet (40 mg total) by mouth daily. X 1 week      lisinopril 2.5 MG tablet   Commonly known as: PRINIVIL,ZESTRIL   Take 1 tablet (2.5 mg total) by mouth daily.      multivitamin tablet   Take 1 tablet by mouth daily.      oxyCODONE 5 MG immediate release tablet   Commonly known as: Oxy IR/ROXICODONE   Take 1-2 tablets (5-10 mg total) by mouth every 3 (three) hours as needed for pain.      potassium chloride SA 20 MEQ tablet   Commonly known as: K-DUR,KLOR-CON   Take 1 tablet (20 mEq total) by mouth daily. X  1 week             Discharge Instructions:  The patient is to refrain from driving, heavy lifting or strenuous activity.  May shower daily and clean incisions with soap and water.  May resume regular diet.   Follow Up:  Discharge Orders    Future Appointments: Provider: Department: Dept Phone: Center:   08/16/2012 2:30 PM Purcell Nails, MD [facility] [phone] [facility]      Follow-up Information    Follow up with Purcell Nails, MD on 08/16/2012. (Have a chest x-ray at 1:30, then see MD at 2:30)    Contact information:   [address] [place] [address] [phone]       Follow up with Charlynne Pander, DDS. (As directed)    Contact information:   [address] [place] [address] [phone]       Follow up with [facility]. Schedule an appointment as soon as possible for a visit in 2 weeks.   Contact information:   [address] [place] [address] [phone]         Calarco,Oza Oberle H 07/27/2012, 8:55 AM

## 2012-07-27 NOTE — Progress Notes (Signed)
CARDIAC REHAB PHASE I   PRE:  Rate/Rhythm: 87 1 degree HB    BP: sitting 92/64    SaO2: 97 RA  MODE:  Ambulation: 500 ft   POST:  Rate/Rhythm: 95    BP: sitting 90/60     SaO2: 98 RA  Came earlier and pt not feeling well. Sts his head feels strange after he takes all his pills at the same time. Returned and pt ready to walk. Steady, no assistance needed. Congestion noted, encouraged using pillow when coughing. To recliner.  [id number]  Miguel York CES, ACSM

## 2012-07-27 NOTE — Progress Notes (Signed)
    SUBJECTIVE: No events. Feeling well. Eating breakfast. NO chest pain or SOB.   BP 95/62  Pulse 90  Temp 98.3 F (36.8 C) (Oral)  Resp 20  Ht 5\' 11"  (1.803 m)  Wt 168 lb 14 oz (76.6 kg)  BMI 23.55 kg/m2  SpO2 97%  Intake/Output Summary (Last 24 hours) at 07/27/12 0729 Last data filed at 07/27/12 1610  Gross per 24 hour  Intake    600 ml  Output    900 ml  Net   -300 ml    PHYSICAL EXAM General: Well developed, well nourished, in no acute distress. Alert and oriented x 3.  Psych:  Good affect, responds appropriately Neck: No JVD. No masses noted.  Lungs: Coarse BS bilaterally with no wheezes noted.  Heart: RRR with no murmurs noted. Abdomen: Bowel sounds are present. Soft, non-tender.  Extremities: Trace bilateral  lower extremity edema.   LABS: Basic Metabolic Panel:  Basename 07/26/12 0519 07/25/12 0401 07/24/12 1700  NA 128* 131* --  K 3.6 4.5 --  CL 95* 97 --  CO2 26 25 --  GLUCOSE 146* 132* --  BUN 20 17 --  CREATININE 0.77 0.92 --  CALCIUM 8.6 8.7 --  MG -- -- 2.5  PHOS -- -- --   CBC:  Basename 07/26/12 0519 07/25/12 0401  WBC 8.9 13.4*  NEUTROABS -- --  HGB 9.2* 10.1*  HCT 26.3* 29.6*  MCV 92.3 94.0  PLT 83* 77*    Current Meds:    . aspirin EC  325 mg Oral Daily  . docusate sodium  200 mg Oral Daily  . enoxaparin (LOVENOX) injection  40 mg Subcutaneous Q24H  . furosemide  40 mg Oral Daily  . insulin aspart  0-24 Units Subcutaneous TID AC & HS  . metoprolol tartrate  25 mg Oral BID  . pantoprazole  40 mg Oral Q1200  . potassium chloride  40 mEq Oral Daily  . sodium chloride  3 mL Intravenous Q12H  . DISCONTD: potassium chloride  20 mEq Oral Daily    Principal Problem:  *S/P mitral valve repair Active Problems:  COPD (chronic obstructive pulmonary disease)  Cardiomyopathy  Congestive heart failure  Coronary artery disease  S/P CABG x 4   ASSESSMENT AND PLAN:  1. CAD: s/p 4V CABG last week.Doing well. Continue ASA, beta  blocker. No statin secondary to abnormal LFTs.   2. Cardiomyopathy: Likely combination of ischemic and non-ischemic. Continue beta blocker. Add ARB when BP improved (still 90s systolic last 24 hours). Mild volume overload post-op. Agree with diuresis. Consideration for ICD in future if LVEF does not improve post revascularization.   3. Mitral regurgitation: s/p MV repair  4. Post-op anemia: Stable.   He is a patient in the [facility] Cardiology office and will f/u there after d/c.    Miguel York  9/3/20137:29 AM

## 2012-07-27 NOTE — Progress Notes (Signed)
Pt ambulating independently in hall. 

## 2012-07-27 NOTE — Discharge Summary (Signed)
I agree with the above discharge summary and plan for follow-up.  Shalisha Clausing H  

## 2012-07-27 NOTE — Progress Notes (Signed)
Nutrition Follow-up  Intervention:    No nutrition intervention at this time  RD to continue to follow  Assessment:   Patient s/p CABG, mitral valve repair 8/30. Resource Breeze supplement discontinued pre-op. PO intake 75-100% per flowsheet records. Discharge summary complete.  Diet Order:  Dysphagia 3, thin liquids  Meds: Scheduled Meds:   . aspirin EC  325 mg Oral Daily  . docusate sodium  200 mg Oral Daily  . enoxaparin (LOVENOX) injection  40 mg Subcutaneous Q24H  . furosemide  40 mg Oral Daily  . metoprolol tartrate  25 mg Oral BID  . pantoprazole  40 mg Oral Q1200  . potassium chloride  40 mEq Oral Daily  . sodium chloride  3 mL Intravenous Q12H  . DISCONTD: insulin aspart  0-24 Units Subcutaneous TID AC & HS  . DISCONTD: potassium chloride  20 mEq Oral Daily   Continuous Infusions:  PRN Meds:.sodium chloride, bisacodyl, bisacodyl, levalbuterol, ondansetron (ZOFRAN) IV, ondansetron, oxyCODONE, sodium chloride, traMADol  Labs:  CMP     Component Value Date/Time   NA 128* 07/26/2012 0519   K 3.6 07/26/2012 0519   CL 95* 07/26/2012 0519   CO2 26 07/26/2012 0519   GLUCOSE 146* 07/26/2012 0519   BUN 20 07/26/2012 0519   CREATININE 0.77 07/26/2012 0519   CREATININE 1.12 07/12/2012 0907   CALCIUM 8.6 07/26/2012 0519   PROT 8.0 07/19/2012 1255   ALBUMIN 2.8* 07/19/2012 1255   AST 138* 07/19/2012 1255   ALT 112* 07/19/2012 1255   ALKPHOS 74 07/19/2012 1255   BILITOT 0.9 07/19/2012 1255   GFRNONAA >90 07/26/2012 0519   GFRAA >90 07/26/2012 0519     Intake/Output Summary (Last 24 hours) at 07/27/12 1127 Last data filed at 07/27/12 1610  Gross per 24 hour  Intake    240 ml  Output    900 ml  Net   -660 ml    CBG (last 3)   Basename 07/27/12 1100 07/27/12 0641 07/26/12 2017  GLUCAP 115* 105* 127*    Weight Status:  76.6 kg (9/3) -- fluctuating  Estimated needs:  1800-2000 kcals, 90-100 gm protein  Nutrition Dx:  Predicted suboptimal energy intake, resolved  Goal:  Oral intake  with meals to meet >/= 90% of estimated nutrition needs, met  Monitor:  PO intake, weight, labs, I/O's  Kirkland Hun, RD, LDN Pager #: [phone] After-Hours Pager #: [phone]

## 2012-07-28 ENCOUNTER — Encounter: Payer: Self-pay | Admitting: Thoracic Surgery (Cardiothoracic Vascular Surgery)

## 2012-07-28 DIAGNOSIS — Z951 Presence of aortocoronary bypass graft: Secondary | ICD-10-CM

## 2012-07-28 LAB — CBC
Platelets: 135 10*3/uL — ABNORMAL LOW (ref 150–400)
RBC: 3.04 MIL/uL — ABNORMAL LOW (ref 4.22–5.81)
RDW: 13.8 % (ref 11.5–15.5)
WBC: 5.7 10*3/uL (ref 4.0–10.5)

## 2012-07-28 LAB — BASIC METABOLIC PANEL
CO2: 26 mEq/L (ref 19–32)
Chloride: 94 mEq/L — ABNORMAL LOW (ref 96–112)
GFR calc Af Amer: >90 mL/min (ref >90)
Potassium: 4.7 mEq/L (ref 3.5–5.1)

## 2012-07-28 NOTE — Progress Notes (Signed)
    SUBJECTIVE: No events. No chest pain or SOB.   BP 108/71  Pulse 91  Temp 98.5 F (36.9 C) (Oral)  Resp 20  Ht 5\' 11"  (1.803 m)  Wt 167 lb 15.9 oz (76.2 kg)  BMI 23.43 kg/m2  SpO2 96%  Intake/Output Summary (Last 24 hours) at 07/28/12 0717 Last data filed at 07/27/12 2346  Gross per 24 hour  Intake    483 ml  Output   2300 ml  Net  -1817 ml    PHYSICAL EXAM General: Well developed, well nourished, in no acute distress. Alert and oriented x 3.  Psych:  Good affect, responds appropriately Neck: No JVD. No masses noted.  Lungs: Clear bilaterally with no wheezes or rhonci noted.  Heart: RRR with no murmurs noted. Abdomen: Bowel sounds are present. Soft, non-tender.  Extremities: Trace bilateral lower extremity edema.   LABS: Basic Metabolic Panel:  Basename 07/26/12 0519  NA 128*  K 3.6  CL 95*  CO2 26  GLUCOSE 146*  BUN 20  CREATININE 0.77  CALCIUM 8.6  MG --  PHOS --   CBC:  Basename 07/26/12 0519  WBC 8.9  NEUTROABS --  HGB 9.2*  HCT 26.3*  MCV 92.3  PLT 83*    Current Meds:    . aspirin EC  325 mg Oral Daily  . carvedilol  12.5 mg Oral BID WC  . docusate sodium  200 mg Oral Daily  . enoxaparin (LOVENOX) injection  40 mg Subcutaneous Q24H  . furosemide  40 mg Oral Daily  . lisinopril  2.5 mg Oral Daily  . pantoprazole  40 mg Oral Q1200  . potassium chloride  40 mEq Oral Daily  . sodium chloride  3 mL Intravenous Q12H  . DISCONTD: insulin aspart  0-24 Units Subcutaneous TID AC & HS  . DISCONTD: metoprolol tartrate  25 mg Oral BID     ASSESSMENT AND PLAN:  1. CAD: s/p 4V CABG last week.Doing well. Continue ASA, beta blocker. No statin secondary to abnormal LFTs. Will need repeat LFTs as an outpatient.   2. Cardiomyopathy: Likely combination of ischemic and non-ischemic. Continue beta blocker. Low dose Lisinopril added yesterday.  Still with mild volume overload. Would d/c home on Lasix at current dosage. Check BMET before discharge.  Consideration for ICD in future if LVEF does not improve post revascularization.   3. Mitral regurgitation: s/p MV repair   4. Post-op anemia: Stable. Would repeat CBC before discharge.   He is a patient in the [facility] Cardiology office and will f/u there after d/c.     Miguel York  9/4/20137:17 AM

## 2012-07-28 NOTE — Progress Notes (Addendum)
                    [address]            [place] [address]          [phone]     5 Days Post-Op Procedure(s) (LRB): CORONARY ARTERY BYPASS GRAFTING (CABG) (N/A) MITRAL VALVE REPAIR (MVR) (N/A) CLIPPING OF ATRIAL APPENDAGE (Left)  Subjective: Feeling well, no complaints. Wants to go home.   Objective: Vital signs in last 24 hours: Patient Vitals for the past 24 hrs:  BP Temp Temp src Pulse Resp SpO2 Weight  07/28/12 0429 108/71 mmHg 98.5 F (36.9 C) Oral 91  20  96 % 167 lb 15.9 oz (76.2 kg)  07/27/12 1943 115/77 mmHg 98.4 F (36.9 C) Oral 98  20  100 % -  07/27/12 1320 107/65 mmHg 97.9 F (36.6 C) Oral 84  - 99 % -  07/27/12 0930 116/65 mmHg - - 90  - 98 % -  07/27/12 0915 119/70 mmHg - - - - - -  07/27/12 0900 106/47 mmHg - - - - - -  07/27/12 0844 104/59 mmHg - - 90  - 97 % -   Current Weight  07/28/12 167 lb 15.9 oz (76.2 kg)     Intake/Output from previous day: 09/03 0701 - 09/04 0700 In: 483 [P.O.:480; I.V.:3] Out: 2300 [Urine:2300]    PHYSICAL EXAM:  Heart: RRR Lungs: Clear to auscultation Wound: Clean and dry Extremities: Mild LE edema    Lab Results: CBC: Basename 07/26/12 0519  WBC 8.9  HGB 9.2*  HCT 26.3*  PLT 83*   BMET:  Basename 07/26/12 0519  NA 128*  K 3.6  CL 95*  CO2 26  GLUCOSE 146*  BUN 20  CREATININE 0.77  CALCIUM 8.6    PT/INR: No results found for this basename: LABPROT,INR in the last 72 hours    Assessment/Plan: S/P Procedure(s) (LRB): CORONARY ARTERY BYPASS GRAFTING (CABG) (N/A) MITRAL VALVE REPAIR (MVR) (N/A) CLIPPING OF ATRIAL APPENDAGE (Left) Progressing well.  Plan discharge home- instructions reviewed with patient.   LOS: 13 days    Elling,GINA H 07/28/2012   I have seen and examined the patient and agree with the assessment and plan as outlined.  OWEN,CLARENCE H 07/28/2012 9:03 AM

## 2012-07-28 NOTE — Progress Notes (Signed)
Monitor tech informs the nurse that the patient experienced 5 beats of V-Tach. Nurse checks in on patient. Patient stated that he felt fine, and was asymptomatic. Miguel York

## 2012-07-28 NOTE — Progress Notes (Signed)
[id number] Cardiac Rehab Completed discharge education with pt and significant other. Pt voices understanding.

## 2012-07-28 NOTE — Discharge Summary (Signed)
I agree with the above discharge summary and plan for follow-up.  Aleza Pew H  

## 2012-07-30 ENCOUNTER — Telehealth: Payer: Self-pay

## 2012-07-30 DIAGNOSIS — R600 Localized edema: Secondary | ICD-10-CM

## 2012-07-30 MED ORDER — POTASSIUM CHLORIDE CRYS ER 20 MEQ PO TBCR: 20.0000 meq | EXTENDED_RELEASE_TABLET | Freq: Two times a day (BID) | ORAL | Status: DC

## 2012-07-30 MED ORDER — FUROSEMIDE 40 MG PO TABS: 40.0000 mg | ORAL_TABLET | Freq: Two times a day (BID) | ORAL | Status: DC

## 2012-07-30 MED FILL — Heparin Sodium (Porcine) Inj 1000 Unit/ML: INTRAMUSCULAR | Qty: 30 | Status: AC

## 2012-07-30 MED FILL — Sodium Bicarbonate IV Soln 8.4%: INTRAVENOUS | Qty: 50 | Status: AC

## 2012-07-30 MED FILL — Lidocaine HCl IV Inj 20 MG/ML: INTRAVENOUS | Qty: 5 | Status: AC

## 2012-07-30 MED FILL — Heparin Sodium (Porcine) Inj 1000 Unit/ML: INTRAMUSCULAR | Qty: 10 | Status: AC

## 2012-07-30 MED FILL — Electrolyte-R (PH 7.4) Solution: INTRAVENOUS | Qty: 3000 | Status: AC

## 2012-07-30 MED FILL — Mannitol IV Soln 20%: INTRAVENOUS | Qty: 500 | Status: AC

## 2012-07-30 MED FILL — Sodium Chloride Irrigation Soln 0.9%: Qty: 3000 | Status: AC

## 2012-07-30 MED FILL — Sodium Chloride IV Soln 0.9%: INTRAVENOUS | Qty: 1000 | Status: AC

## 2012-07-30 NOTE — Telephone Encounter (Signed)
Pt denies any dyspnea, he has not been keeping legs elevated. Dr Cornelius Moras increased lasix 40 mg to BID with Potassium 20 meq BID. RX's were sent to pharm via epic and Pt is scheduled to see Dr. Clifton James on Monday 08/02/2012 @ 1420. Pt is aware.

## 2012-08-02 ENCOUNTER — Other Ambulatory Visit: Payer: Self-pay

## 2012-08-02 ENCOUNTER — Ambulatory Visit (INDEPENDENT_AMBULATORY_CARE_PROVIDER_SITE_OTHER): Payer: BC Managed Care – PPO | Admitting: Adult Health

## 2012-08-02 ENCOUNTER — Encounter: Payer: Self-pay | Admitting: Adult Health

## 2012-08-02 ENCOUNTER — Ambulatory Visit (HOSPITAL_COMMUNITY)
Admission: RE | Admit: 2012-08-02 | Discharge: 2012-08-02 | Disposition: A | Discharge status: Home / Self Care | Payer: BC Managed Care – PPO | Source: Ambulatory Visit | Attending: Adult Health | Admitting: Adult Health

## 2012-08-02 VITALS — BP 80/46 | HR 68 | Ht 72.0 in | Wt 181.2 lb

## 2012-08-02 DIAGNOSIS — R609 Edema, unspecified: Secondary | ICD-10-CM

## 2012-08-02 DIAGNOSIS — R600 Localized edema: Secondary | ICD-10-CM

## 2012-08-02 DIAGNOSIS — Z9889 Other specified postprocedural states: Secondary | ICD-10-CM | POA: Insufficient documentation

## 2012-08-02 DIAGNOSIS — I509 Heart failure, unspecified: Secondary | ICD-10-CM

## 2012-08-02 DIAGNOSIS — G8918 Other acute postprocedural pain: Secondary | ICD-10-CM

## 2012-08-02 DIAGNOSIS — R0602 Shortness of breath: Secondary | ICD-10-CM | POA: Insufficient documentation

## 2012-08-02 LAB — COMPREHENSIVE METABOLIC PANEL
Albumin: 2.9 g/dL — ABNORMAL LOW (ref 3.5–5.2)
Alkaline Phosphatase: 99 U/L (ref 39–117)
BUN: 42 mg/dL — ABNORMAL HIGH (ref 6–23)
Creat: 1.41 mg/dL — ABNORMAL HIGH (ref 0.50–1.35)
Glucose, Bld: 166 mg/dL — ABNORMAL HIGH (ref 70–99)
Potassium: 4.9 mEq/L (ref 3.5–5.3)
Total Bilirubin: 0.8 mg/dL (ref 0.3–1.2)

## 2012-08-02 LAB — CBC WITH DIFFERENTIAL/PLATELET
Eosinophils Absolute: 0.1 10*3/uL (ref 0.0–0.7)
HCT: 51.7 % (ref 39.0–52.0)
Hemoglobin: 17.8 g/dL — ABNORMAL HIGH (ref 13.0–17.0)
Lymphs Abs: 0.8 10*3/uL (ref 0.7–4.0)
MCH: 32.3 pg (ref 26.0–34.0)
Monocytes Absolute: 0.2 10*3/uL (ref 0.1–1.0)
Monocytes Relative: 9 % (ref 3–12)
Neutrophils Relative %: 55 % (ref 43–77)
RBC: 5.51 MIL/uL (ref 4.22–5.81)

## 2012-08-02 MED ORDER — OXYCODONE HCL 5 MG PO TABS: 5.0000 mg | ORAL_TABLET | Freq: Four times a day (QID) | ORAL | Status: DC | PRN

## 2012-08-02 MED ORDER — CARVEDILOL 12.5 MG PO TABS: 6.2500 mg | ORAL_TABLET | Freq: Two times a day (BID) | ORAL | Status: DC

## 2012-08-02 MED ORDER — FUROSEMIDE 40 MG PO TABS: 40.0000 mg | ORAL_TABLET | Freq: Two times a day (BID) | ORAL | Status: DC

## 2012-08-02 NOTE — Patient Instructions (Addendum)
STAT LABS NOW:  BNP, CMET, & CBC.  STAT Chest x-ray.  Take Lasix 80mg  twice daily for 2 days.  Stop Lisinopril.  Decrease Carvedilol to 6.25mg  twice daily.  Take blood pressure at 6PM, 9PM and one additional time in the night and again at breakfast.  If at any point symptoms worsen, call EMS and go to the ED.

## 2012-08-02 NOTE — Assessment & Plan Note (Addendum)
He has significant pleural overload of the lower extremities up into the lower thighs with dyspnea on exertion coughing. He is an EF of 15% status post CABG 2 weeks ago. I feel that the patient is in systolic heart failure and needs to be admitted. He is hypotensive and I am concerned about sepsis as well in the setting of recent teeth extraction, and surgery. I discussed this with Dr. [name], who does not wish to have the patient admitted at this time. He wishes to treat the patient as an outpatient for now. He requests chest x-ray, BNP., CMET, and a CBC be completed today. I discussed this with his wife, she is very concerned about taking him home but will agree to have labs and chest x-ray completed. In the interim I have taken him off of lisinopril, will decreased his Coreg to 6.25 mg twice a day, and increase his Lasix to 80 mg by mouth twice a day and have him be seen in the office in the a.m.      I have talked with her and stated that if he is not any better or becomes increasingly worse that he she should bring him to [facility] hospital ER to be admitted and treated as inpatient. With decreased EF, and fluid overload, I am concerned about recurrent V. tach as well. She has been counseled on not bringing him to the ER if this is necessary but to call EMS. She verbalizes understanding.  Addendum: I have reviewed his labs this am. Discussed them with Dr. [name] on site and with  Dr. Teressa Lower by phone. Mr. Stgermaine is hyponatremic, in renal failure and congestive heart failure. He will be sent to [facility] as a direct admit, accepted by Dr. Teressa Lower for diureses and ongoing treatment. He will be placed on Step Down.

## 2012-08-02 NOTE — Telephone Encounter (Signed)
RX for Oxycodone 5 mg 1-2 tabs every 6 hours prn for pain printed out and pt will pick up at office this PM

## 2012-08-02 NOTE — Addendum Note (Signed)
Addended by: Waymon Budge on: 08/02/2012 03:59 PM   Modules accepted: Orders

## 2012-08-02 NOTE — Progress Notes (Addendum)
HPI: Miguel York is a 61 year old patient we are seeing on hospital followup with me lengthy and complicated medical history. He was recently seen in our office by Dr. Daleen Squibb and myself after being in the emergency room on 07/04/2012 with complaints of lower extremity edema. The patient had subsequent echocardiogram completed which revealed an EF of 15% with only the inferior lateral base contracting normally, with mild to moderately dilated and very mild hypertrophy of the septum. The patient was placed on Lasix, and Coreg 6.25 mg twice a day, along with spironolactone.  Secondary to significantly reduced LV systolic function the patient was sent for cardiac catheterization.   Catheterization was completed on 07/15/2012 revealing a 20% left main with distal stenosis, LAD mid-vessel had an ulcerated 95% stenosis just after the takeoff of the diagonal branch, the first diagonal was moderate sized and had a 99% mid stenosis. The circumflex artery was moderate size with a 50% proximal stenosis and a 90% mid stenosis. The first OM branch was small to moderate size with an ostial 80% stenosis. The right coronary artery had a diffuse 80% stenosis in the mid vessel. There were serial 40-50% lesions in the distal vessel.   As a result of his abnormal cardiac catheterization he was found to be a candidate for coronary artery bypass grafting and had this completed by Dr. Horald Chestnut with LIMA to LAD, SVG to PDA, SVG to second of his marginal, SVG to first diagonal. He also had a mitral valve repair using a 26 mm Sorin MI with 3-D angioplasty ring, and additionally had clipping of the left atrial appendage in on 07/23/2012. Post surgery also had multiple missing teeth and periodontal disease. He had a dental consultation. Dr. Valentino Hue, who performed complete dental extractions of remaining teeth on 07/20/2012 prior to CABG.    The patient presents to the office today with significant shortness of breath edema of the  lower extremities, up to the posterior thighs, complaints of PND, weakness, and continued chest soreness. His weight is up 20 pounds since discharge. Secondary to these symptoms he did notify our practice over the weekend, one day ago, and his Lasix was increased from 40 mg twice a day to 80 mg twice a day. He has not had any increased urinary output since that time. In fact he states he is finding it difficult to urinate in general.  No Known Allergies  Current Outpatient Prescriptions  Medication Sig Dispense Refill  . albuterol (PROVENTIL HFA;VENTOLIN HFA) 108 (90 BASE) MCG/ACT inhaler Inhale 2 puffs into the lungs as needed.      Marland Kitchen albuterol (PROVENTIL) (2.5 MG/3ML) 0.083% nebulizer solution Take 2.5 mg by nebulization daily as needed.      Marland Kitchen aspirin EC 325 MG EC tablet Take 1 tablet (325 mg total) by mouth daily.  30 tablet    . carvedilol (COREG) 12.5 MG tablet Take 1 tablet (12.5 mg total) by mouth 2 (two) times daily with a meal.  60 tablet  1  . fish oil-omega-3 fatty acids 1000 MG capsule Take 1.2 g by mouth daily.      . furosemide (LASIX) 40 MG tablet Take 1 tablet (40 mg total) by mouth 2 (two) times daily. X 1 week  14 tablet  0  . lisinopril (PRINIVIL,ZESTRIL) 2.5 MG tablet Take 1 tablet (2.5 mg total) by mouth daily.  30 tablet  1  . Multiple Vitamin (MULTIVITAMIN) tablet Take 1 tablet by mouth daily.      Marland Kitchen oxyCODONE (  OXY IR/ROXICODONE) 5 MG immediate release tablet Take 1-2 tablets (5-10 mg total) by mouth every 6 (six) hours as needed for pain.  40 tablet  0  . potassium chloride SA (K-DUR,KLOR-CON) 20 MEQ tablet Take 1 tablet (20 mEq total) by mouth 2 (two) times daily. X 1 week  14 tablet  0    Past Medical History  Diagnosis Date  . COPD (chronic obstructive pulmonary disease)   . Congestive heart failure 07/15/2012  . Coronary artery disease 07/15/2012  . 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 S/P mitral  valve repair 07/23/2012    26 mm Sorin Memo 3D ring annuloplasty    Past Surgical History  Procedure Date  . Tee without cardioversion 07/19/2012    Procedure: TRANSESOPHAGEAL ECHOCARDIOGRAM (TEE);  Surgeon: Pricilla Riffle, MD;  Location: [facility] ENDOSCOPY;  Service: Cardiovascular;  Laterality: N/A;  . Multiple extractions with alveoloplasty 07/20/2012    Procedure: MULTIPLE EXTRACION WITH ALVEOLOPLASTY;  Surgeon: Charlynne Pander, DDS;  Location: [facility] OR;  Service: Oral Surgery;  Laterality: N/A;  Extraction of tooth #'s 2,3,4,5,6,7,8,9,10,11, 17,19, 20, 21,22,27,28,29 with alveoloplasty  . Coronary artery bypass graft 07/23/2012    Procedure: CORONARY ARTERY BYPASS GRAFTING (CABG);  Surgeon: Purcell Nails, MD;  Location: [facility] OR;  Service: Open Heart Surgery;  Laterality: N/A;  . Mitral valve repair 07/23/2012    Procedure: MITRAL VALVE REPAIR (MVR);  Surgeon: Purcell Nails, MD;  Location: [facility] OR;  Service: Open Heart Surgery;  Laterality: N/A;    EAV:WUJWJX of systems complete and found to be negative unless listed above  PHYSICAL EXAM BP 80/46  Pulse 68  Ht 6' (1.829 m)  Wt 181 lb 4 oz (82.214 kg)  BMI 24.58 kg/m2  General: Well developed, well nourished,mild dyspnea when talking. Head: Eyes PERRLA, No xanthomas.   Normal cephalic and atramatic  Lungs: Diminished breath sounds with productive cough, Heart: HRRR S1 S2, distant with soft S3. .  Pulses are 2+ & equal radial.             No carotid bruit. Mild JVD.  No abdominal bruits. No femoral bruits. Abdomen: Bowel sounds are positive, abdomen soft and non-tender without masses or   Hernia's noted. Msk:  Back normal, normal gait. Normal strength and tone for age.New sternotomy incision, healing well. Puncture sites from chest tubes are also healing,. Extremities: No clubbing, cyanosis, 2+ pitting edema of the feet, 1+-2+ of the pretibial, and posterior dependent edema of the thighs. .   Neuro: Alert and oriented X 3. Psych:  Good affect,  responds appropriately   ASSESSMENT AND PLAN

## 2012-08-02 NOTE — Addendum Note (Signed)
Addended by: Waymon Budge on: 08/02/2012 04:26 PM   Modules accepted: Orders

## 2012-08-03 ENCOUNTER — Encounter (HOSPITAL_COMMUNITY): Payer: Self-pay | Admitting: General Practice

## 2012-08-03 ENCOUNTER — Inpatient Hospital Stay (HOSPITAL_COMMUNITY)
Admission: AD | Admit: 2012-08-03 | Discharge: 2012-08-08 | DRG: 544 | Disposition: A | Discharge status: Home / Self Care | Payer: BC Managed Care – PPO | Source: Ambulatory Visit | Attending: Internal Medicine | Admitting: Internal Medicine

## 2012-08-03 ENCOUNTER — Ambulatory Visit: Payer: Self-pay | Admitting: Adult Health

## 2012-08-03 DIAGNOSIS — E871 Hypo-osmolality and hyponatremia: Secondary | ICD-10-CM | POA: Diagnosis present

## 2012-08-03 DIAGNOSIS — I509 Heart failure, unspecified: Secondary | ICD-10-CM | POA: Diagnosis present

## 2012-08-03 DIAGNOSIS — I059 Rheumatic mitral valve disease, unspecified: Secondary | ICD-10-CM | POA: Diagnosis present

## 2012-08-03 DIAGNOSIS — I5023 Acute on chronic systolic (congestive) heart failure: Principal | ICD-10-CM | POA: Diagnosis present

## 2012-08-03 DIAGNOSIS — Z79899 Other long term (current) drug therapy: Secondary | ICD-10-CM

## 2012-08-03 DIAGNOSIS — I429 Cardiomyopathy, unspecified: Secondary | ICD-10-CM

## 2012-08-03 DIAGNOSIS — I2510 Atherosclerotic heart disease of native coronary artery without angina pectoris: Secondary | ICD-10-CM | POA: Diagnosis present

## 2012-08-03 DIAGNOSIS — N179 Acute kidney failure, unspecified: Secondary | ICD-10-CM | POA: Diagnosis present

## 2012-08-03 DIAGNOSIS — D509 Iron deficiency anemia, unspecified: Secondary | ICD-10-CM | POA: Diagnosis present

## 2012-08-03 DIAGNOSIS — J449 Chronic obstructive pulmonary disease, unspecified: Secondary | ICD-10-CM | POA: Diagnosis present

## 2012-08-03 DIAGNOSIS — Z951 Presence of aortocoronary bypass graft: Secondary | ICD-10-CM

## 2012-08-03 DIAGNOSIS — J4489 Other specified chronic obstructive pulmonary disease: Secondary | ICD-10-CM | POA: Diagnosis present

## 2012-08-03 DIAGNOSIS — I2589 Other forms of chronic ischemic heart disease: Secondary | ICD-10-CM | POA: Diagnosis present

## 2012-08-03 DIAGNOSIS — Z720 Tobacco use: Secondary | ICD-10-CM

## 2012-08-03 DIAGNOSIS — Z9889 Other specified postprocedural states: Secondary | ICD-10-CM

## 2012-08-03 DIAGNOSIS — I079 Rheumatic tricuspid valve disease, unspecified: Secondary | ICD-10-CM | POA: Diagnosis present

## 2012-08-03 DIAGNOSIS — I369 Nonrheumatic tricuspid valve disorder, unspecified: Secondary | ICD-10-CM

## 2012-08-03 DIAGNOSIS — D696 Thrombocytopenia, unspecified: Secondary | ICD-10-CM | POA: Diagnosis present

## 2012-08-03 HISTORY — DX: Alcohol abuse, in remission: F10.11

## 2012-08-03 HISTORY — DX: Hyperlipidemia, unspecified: E78.5

## 2012-08-03 HISTORY — DX: Personal history of nicotine dependence: Z87.891

## 2012-08-03 HISTORY — DX: Unspecified systolic (congestive) heart failure: I50.20

## 2012-08-03 HISTORY — DX: Ischemic cardiomyopathy: I25.5

## 2012-08-03 HISTORY — DX: Hypo-osmolality and hyponatremia: E87.1

## 2012-08-03 HISTORY — DX: Iron deficiency anemia, unspecified: D50.9

## 2012-08-03 HISTORY — DX: Abnormal results of liver function studies: R94.5

## 2012-08-03 LAB — BASIC METABOLIC PANEL
Calcium: 9 mg/dL (ref 8.4–10.5)
Creatinine, Ser: 1.27 mg/dL (ref 0.50–1.35)
GFR calc Af Amer: 70 mL/min — ABNORMAL LOW (ref >90)
Glucose, Bld: 125 mg/dL — ABNORMAL HIGH (ref 70–99)
Potassium: 5 mEq/L (ref 3.5–5.1)

## 2012-08-03 LAB — CARBOXYHEMOGLOBIN
O2 Saturation: 66.9 %
Total hemoglobin: 8.7 g/dL — ABNORMAL LOW (ref 13.5–18.0)

## 2012-08-03 LAB — CBC
HCT: 25.9 % — ABNORMAL LOW (ref 39.0–52.0)
MCHC: 34.4 g/dL (ref 30.0–36.0)
MCV: 94.9 fL (ref 78.0–100.0)
Platelets: 230 10*3/uL (ref 150–400)
RDW: 14.4 % (ref 11.5–15.5)

## 2012-08-03 MED ORDER — SODIUM CHLORIDE 0.9 % IJ SOLN: 3.0000 mL | INTRAMUSCULAR | Status: DC | PRN

## 2012-08-03 MED ORDER — SODIUM CHLORIDE 0.9 % IV SOLN
250.0000 mL | INTRAVENOUS | Status: DC | PRN
  Administered 2012-08-03: 15:00:00 via INTRAVENOUS

## 2012-08-03 MED ORDER — ENOXAPARIN SODIUM 30 MG/0.3ML ~~LOC~~ SOLN
30.0000 mg | Freq: Every day | SUBCUTANEOUS | Status: DC
  Administered 2012-08-03: 30 mg via SUBCUTANEOUS
  Filled 2012-08-03 (×2): qty 0.3

## 2012-08-03 MED ORDER — ONDANSETRON HCL 4 MG/2ML IJ SOLN: 4.0000 mg | Freq: Four times a day (QID) | INTRAMUSCULAR | Status: DC | PRN

## 2012-08-03 MED ORDER — POTASSIUM CHLORIDE CRYS ER 20 MEQ PO TBCR
EXTENDED_RELEASE_TABLET | ORAL | Status: AC
  Administered 2012-08-03: 20 meq
  Filled 2012-08-03: qty 1

## 2012-08-03 MED ORDER — POTASSIUM CHLORIDE CRYS ER 20 MEQ PO TBCR
20.0000 meq | EXTENDED_RELEASE_TABLET | Freq: Two times a day (BID) | ORAL | Status: DC
  Administered 2012-08-04 (×2): 20 meq via ORAL
  Filled 2012-08-03 (×4): qty 1

## 2012-08-03 MED ORDER — FUROSEMIDE 10 MG/ML IJ SOLN
80.0000 mg | Freq: Two times a day (BID) | INTRAMUSCULAR | Status: DC
  Administered 2012-08-03: 80 mg via INTRAVENOUS
  Filled 2012-08-03 (×2): qty 8

## 2012-08-03 MED ORDER — OXYCODONE HCL 5 MG PO CAPS: 5.0000 mg | ORAL_CAPSULE | Freq: Four times a day (QID) | ORAL | Status: DC | PRN

## 2012-08-03 MED ORDER — FUROSEMIDE 10 MG/ML IJ SOLN
INTRAMUSCULAR | Status: AC
  Administered 2012-08-03: 80 mg
  Filled 2012-08-03: qty 8

## 2012-08-03 MED ORDER — OXYCODONE HCL 5 MG PO TABS
5.0000 mg | ORAL_TABLET | Freq: Four times a day (QID) | ORAL | Status: DC | PRN
  Administered 2012-08-03 – 2012-08-06 (×9): 5 mg via ORAL
  Filled 2012-08-03 (×9): qty 1

## 2012-08-03 MED ORDER — SODIUM CHLORIDE 0.9 % IJ SOLN
10.0000 mL | INTRAMUSCULAR | Status: DC | PRN
  Administered 2012-08-08: 20 mL
  Administered 2012-08-08 (×2): 10 mL

## 2012-08-03 MED ORDER — SODIUM CHLORIDE 0.9 % IJ SOLN
10.0000 mL | Freq: Two times a day (BID) | INTRAMUSCULAR | Status: DC
  Administered 2012-08-03 – 2012-08-04 (×2): 20 mL
  Administered 2012-08-04: 10 mL
  Administered 2012-08-05: 20 mL
  Administered 2012-08-05: 10 mL
  Administered 2012-08-06: 20 mL
  Administered 2012-08-06 – 2012-08-07 (×2): 10 mL

## 2012-08-03 MED ORDER — MILRINONE IN DEXTROSE 200-5 MCG/ML-% IV SOLN
0.1250 ug/kg/min | INTRAVENOUS | Status: DC
  Administered 2012-08-03: 0.125 ug/kg/min via INTRAVENOUS
  Filled 2012-08-03 (×2): qty 100

## 2012-08-03 MED ORDER — FUROSEMIDE 10 MG/ML IJ SOLN
INTRAMUSCULAR | Status: AC
  Filled 2012-08-03: qty 8

## 2012-08-03 MED ORDER — ACETAMINOPHEN 325 MG PO TABS: 650.0000 mg | ORAL_TABLET | ORAL | Status: DC | PRN

## 2012-08-03 MED ORDER — ASPIRIN EC 325 MG PO TBEC
325.0000 mg | DELAYED_RELEASE_TABLET | Freq: Every day | ORAL | Status: DC
  Administered 2012-08-04 – 2012-08-05 (×2): 325 mg via ORAL
  Filled 2012-08-03 (×3): qty 1

## 2012-08-03 MED ORDER — OXYCODONE HCL 5 MG PO TABS
10.0000 mg | ORAL_TABLET | Freq: Four times a day (QID) | ORAL | Status: DC | PRN
  Administered 2012-08-06 – 2012-08-08 (×5): 10 mg via ORAL
  Filled 2012-08-03 (×5): qty 2

## 2012-08-03 MED ORDER — SODIUM CHLORIDE 0.9 % IJ SOLN
3.0000 mL | Freq: Two times a day (BID) | INTRAMUSCULAR | Status: DC
  Administered 2012-08-03 – 2012-08-08 (×11): 3 mL via INTRAVENOUS

## 2012-08-03 NOTE — H&P (Addendum)
Advanced Heart Failure Team History and Physical Note   Primary Physician: Primary Cardiologist:  Dr [name]  Reason for Admission: Dyspnea and volume overload  Baseline proBNP: Weight Range:  HPI:   Miguel. Miguel York is a 61 year old with a history of chronic systolic heart failure EF 15 %, ICM, CAD S/P CABG x 3  07/23/12 (LIMA to LAD, SVG to PDA, SVG to second of his marginal, SVG to first diagonal,  and additionally had clipping of the left atrial appendage), Quit smoking 1 month ago,  COPD, Moderate Miguel/TR S/P MVR 07/23/12, and quit drinking 1 month ago (18-24 beers per day).    Pre Cath before subsequent CABG  Ao: 104/77  LV: 106/24/34  RA: 23  RV: 42/18/28  PA: 47/25 (mean 35)  PCWP: 27  Fick Cardiac Output: 4.0 L/min  Fick Cardiac Index: 2.0 L/min/m2  Central Aortic Saturation: 96%  Pulmonary Artery Saturation: 61%  Angiographic Findings:  Left main: 20% distal stenosis.  Left Anterior Descending Artery: Large caliber vessel that courses to the apex. The mid vessel has an ulcerated, 95% stenosis just after the takeoff of the diagonal branch. The distal vessel has diffuse plaque but no focally obstructive lesions. The first diagonal is moderate sized and has 99% mid stenosis. The second diagonal is moderate sized and has mild plaque disease.  Circumflex Artery: Moderate sized vessel with 50% proximal stenosis, 90% mid stenosis. The intermediate branch is moderate sized with no obstructive disease noted. The first OM branch is small to moderate sized with ostial 80% stenosis.  Right Coronary Artery: Large dominant vessel with diffuse 80% stenosis in the mid vessel. There are serial 40-50 lesions in the distal vessel. The PLA and PDA are moderate sized and patent with diffuse mild plaque.  Left Ventricular Angiogram: Deferred.    Miguel York was discharged from [facility] 07/28/12 after CABG x 3, MVR repair, and clipping of atrial appendage. His discharge weight was 167 pounds. Over the weekend he  developed progressive dyspnea. He  contacted Dr Langston Masker office and he was instrcuted to  Increase lasix to 80 mg twice a day. He was evaluated by Dr Daleen Squibb 08/02/12 and had a 20 pound weight gain. Pertinent labs from 08/02/12 included creatinine increased to 1.4 from baseline 0.7 and sodium reduced to 122. Despite increasded diuretics in the community he continued to complain of dyspnea with exertion. Today he was instructed by Dr [name] to return to [facility] for hospital admit.   Dyspnea with exertion. + Orthopnea. Denies PND.     eview of Systems: [y] = yes, [ ]  = no   General: Weight gain [ Y]; Weight loss [ ] ; Anorexia [ ] ; Fatigue [Y ]; Fever [ ] ; Chills [ ] ; Weakness [ ]   Cardiac: Chest pain/pressure [ ] ; Resting SOB [ ] ; Exertional SOB [Y ]; Orthopnea Gilian.Kraft ]; Pedal Edema [Y ]; Palpitations [ ] ; Syncope [ ] ; Presyncope [ ] ; Paroxysmal nocturnal dyspnea[ ]   Pulmonary: Cough [ Y]; Wheezing[ ] ; Hemoptysis[ ] ; Sputum [ ] ; Snoring [ ]   GI: Vomiting[ ] ; Dysphagia[ ] ; Melena[ ] ; Hematochezia [ ] ; Heartburn[ ] ; Abdominal pain [ ] ; Constipation [ ] ; Diarrhea [ ] ; BRBPR [ ]   GU: Hematuria[ ] ; Dysuria [ ] ; Nocturia[ ]   Vascular: Pain in legs with walking [ ] ; Pain in feet with lying flat [ ] ; Non-healing sores [ ] ; Stroke [ ] ; TIA [ ] ; Slurred speech [ ] ;  Neuro: Headaches[ ] ; Vertigo[ ] ; Seizures[ ] ; Paresthesias[ ] ;Blurred vision [ ] ; Diplopia [ ] ;  Vision changes [ ]   Ortho/Skin: Arthritis [ ] ; Joint pain [ ] ; Muscle pain [ ] ; Joint swelling [ ] ; Back Pain [ ] ; Rash [ ]   Psych: Depression[ ] ; Anxiety[ ]   Heme: Bleeding problems [ ] ; Clotting disorders [ ] ; Anemia [ ]   Endocrine: Diabetes [ ] ; Thyroid dysfunction[ ]   Home Medications Prior to Admission medications   Medication Sig Start Date End Date Taking? Authorizing Provider  albuterol (PROVENTIL HFA;VENTOLIN HFA) 108 (90 BASE) MCG/ACT inhaler Inhale 2 puffs into the lungs as needed.    Historical Provider, MD  albuterol (PROVENTIL) (2.5 MG/3ML) 0.083%  nebulizer solution Take 2.5 mg by nebulization daily as needed.    Historical Provider, MD  aspirin EC 325 MG EC tablet Take 1 tablet (325 mg total) by mouth daily. 07/27/12 08/06/12  Wilmon Pali, PA  carvedilol (COREG) 12.5 MG tablet Take 0.5 tablets (6.25 mg total) by mouth 2 (two) times daily with a meal. 08/02/12 08/02/13  Jodelle Gross, NP  fish oil-omega-3 fatty acids 1000 MG capsule Take 1.2 g by mouth daily.    Historical Provider, MD  furosemide (LASIX) 40 MG tablet Take 1 tablet (40 mg total) by mouth 2 (two) times daily. Take 2 tablets twice daily for 2 days then resume 1 tablet twice daily. 08/02/12 08/02/13  Jodelle Gross, NP  Multiple Vitamin (MULTIVITAMIN) tablet Take 1 tablet by mouth daily.    Historical Provider, MD  oxyCODONE (OXY IR/ROXICODONE) 5 MG immediate release tablet Take 1-2 tablets (5-10 mg total) by mouth every 6 (six) hours as needed for pain. 08/02/12 08/12/12  Jodelle Gross, NP  potassium chloride SA (K-DUR,KLOR-CON) 20 MEQ tablet Take 1 tablet (20 mEq total) by mouth 2 (two) times daily. X 1 week 07/30/12 07/30/13  Purcell Nails, MD    Past Medical History: Past Medical History  Diagnosis Date  . COPD (chronic obstructive pulmonary disease)   . Congestive heart failure 07/15/2012  . Coronary artery disease 07/15/2012  . 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 S/P mitral valve repair 07/23/2012    26 mm Sorin Memo 3D ring annuloplasty    Past Surgical History: Past Surgical History  Procedure Date  . Tee without cardioversion 07/19/2012    Procedure: TRANSESOPHAGEAL ECHOCARDIOGRAM (TEE);  Surgeon: Pricilla Riffle, MD;  Location: [facility] ENDOSCOPY;  Service: Cardiovascular;  Laterality: N/A;  . Multiple extractions with alveoloplasty 07/20/2012    Procedure: MULTIPLE EXTRACION WITH ALVEOLOPLASTY;  Surgeon: Charlynne Pander, DDS;  Location: [facility] OR;  Service: Oral Surgery;  Laterality: N/A;  Extraction of  tooth #'s 2,3,4,5,6,7,8,9,10,11, 17,19, 20, 21,22,27,28,29 with alveoloplasty  . Coronary artery bypass graft 07/23/2012    Procedure: CORONARY ARTERY BYPASS GRAFTING (CABG);  Surgeon: Purcell Nails, MD;  Location: [facility] OR;  Service: Open Heart Surgery;  Laterality: N/A;  . Mitral valve repair 07/23/2012    Procedure: MITRAL VALVE REPAIR (MVR);  Surgeon: Purcell Nails, MD;  Location: [facility] OR;  Service: Open Heart Surgery;  Laterality: N/A;    Family History: Family History  Problem Relation Age of Onset  . Heart disease Mother   . Heart attack Mother   . Heart failure Mother   . Heart attack Sister   . Heart disease Sister     Social History: History   Social History  . Marital Status: Single    Spouse Name:  N/A    Number of Children: N/A  . Years of Education: N/A   Social History Main Topics  . Smoking status: Former Smoker -- 1.5 packs/day for 46 years  . Smokeless tobacco: Not on file  . Alcohol Use: 50.4 oz/week    84 Cans of beer per week     occ  . Drug Use: No     former crack and marijuana  . Sexually Active: Yes   Other Topics Concern  . Not on file   Social History Narrative  . No narrative on file    Allergies:  No Known Allergies  Objective:    Vital Signs:   Pulse Rate:  [68] 68  (09/09 1434) BP: (80)/(46) 80/46 mmHg (09/09 1434) Weight:  [82.214 kg (181 lb 4 oz)] 82.214 kg (181 lb 4 oz) (09/09 1434)   There were no vitals filed for this visit.  Physical Exam: General:  Chronically ill appearing. No resp difficulty HEENT: normal Neck: supple. JVP to jaw . Carotids 2+ bilat; no bruits. No lymphadenopathy or thryomegaly appreciated. Cor: PMI nondisplaced. Regular rate & rhythm. No rubs, gallops or murmurs. Sternal incision approximated Lungs: clear Abdomen: soft, nontender, distended. No hepatosplenomegaly. No bruits or masses. Good bowel sounds. Extremities: no cyanosis, clubbing, rash, R and LLE + edema Neuro: alert & orientedx3, cranial  nerves grossly intact. moves all 4 extremities w/o difficulty. Affect pleasant  Telemetry: SR occasional PVC  Labs: Basic Metabolic Panel:  Lab 08/02/12 1610 07/28/12 0814  NA 122* 128*  K 4.9 4.7  CL 88* 94*  CO2 25 26  GLUCOSE 166* 188*  BUN 42* 19  CREATININE 1.41* 0.88  CALCIUM 9.0 9.0  MG -- --  PHOS -- --    Liver Function Tests:  Lab 08/02/12 1526  AST 141*  ALT 82*  ALKPHOS 99  BILITOT 0.8  PROT 8.0  ALBUMIN 2.9*   No results found for this basename: LIPASE:5,AMYLASE:5 in the last 168 hours No results found for this basename: AMMONIA:3 in the last 168 hours  CBC:  Lab 08/02/12 1526 07/28/12 0814  WBC 2.5* 5.7  NEUTROABS 1.4* --  HGB 17.8* 9.7*  HCT 51.7 28.4*  MCV 93.8 93.4  PLT 86* 135*    Cardiac Enzymes: No results found for this basename: CKTOTAL:5,CKMB:5,CKMBINDEX:5,TROPONINI:5 in the last 168 hours  BNP: BNP (last 3 results)  Basename 07/03/12 2330 05/11/12 1451  PROBNP 1670.0* 1842.0*    CBG:  Lab 07/27/12 1600  GLUCAP 141*    Coagulation Studies: No results found for this basename: LABPROT:5,INR:5 in the last 72 hours  Other results:   Imaging: Dg Chest 2 View  08/02/2012  *RADIOLOGY REPORT*  Clinical Data: The liver surgery approximately 2 weeks ago.  Follow- up evaluation.  CHEST - 2 VIEW  Comparison: 07/25/2012.  Findings: There has been a previous median sternotomy. Wire sternal sutures and left atrial clip appear unchanged.  There is cardiomegaly.  There are no edematous changes.  There is improved aeration of the lung bases.  There are small bilateral pleural effusions (left greater than right).  There is no pneumothorax. The sheath previously seen within the right internal jugular vein has been removed.  IMPRESSION: Small bilateral pleural effusions (left greater than right).  No pneumothorax. Improved bibasilar aeration.   Original Report Authenticated By: Rolla Plate, M.D.          Assessment:  1. A/C systolic  heart failure EF 15% 2. CAD S/P CABG x3 07/23/12  LIMA to  LAD, SVG to PDA, SVG to second of his marginal, SVG to first diagonal 3. Mitral regurgitation   S/P MVR 07/23/12 4. Acute renal failure 5. Acute respiratory failure 6. Hyponatremia 7. Thrombocytopenia   Plan/Discussion:    Miguel York is admitted today due volume over load in the setting of acute systolic heart failure. He has failed outpatient diuresis with po lasix. Progressive dyspnea with exertion.  Plan to diurese with IV lasix 80 mg IV bid. Start Milrinone 0.125 mcg. Stop carvedilol due to acute decompensation. No Ace due to renal insufficiency.  Will place PICC to follow CVP and CO-OX. Repeat ECHO. Continue to follow BMET. If sodium remains low will need Tolvaptan for a few days. Place ted hose. Strict I/O.    Dr Cornelius Moras notified of admit.   Length of Stay:  CLEGG,AMY NP-C 08/03/2012, 11:32 AM  Patient seen and examined with Tonye Becket, NP. We discussed all aspects of the encounter. I agree with the assessment and plan as stated above. Patient with apparent low output HF with marked volume overload and severe hyponatremia not responding to titration of outpatient therapy. Admit to stepdown. Start inotropes and IV lasix. Add digoxin. Repeat echo. Discussed his tenuous situation with patient and wife.   Truman Hayward

## 2012-08-03 NOTE — Progress Notes (Signed)
  Echocardiogram 2D Echocardiogram has been performed.  Christiann Hagerty 08/03/2012, 2:33 PM

## 2012-08-03 NOTE — Progress Notes (Signed)
Peripherally Inserted Central Catheter/Midline Placement  The IV Nurse has discussed with the patient and/or persons authorized to consent for the patient, the purpose of this procedure and the potential benefits and risks involved with this procedure.  The benefits include less needle sticks, lab draws from the catheter and patient may be discharged home with the catheter.  Risks include, but not limited to, infection, bleeding, blood clot (thrombus formation), and puncture of an artery; nerve damage and irregular heat beat.  Alternatives to this procedure were also discussed.  PICC/Midline Placement Documentation        Timmothy Sours 08/03/2012, 3:58 PM

## 2012-08-03 NOTE — Care Management Note (Addendum)
    Page 1 of 2   08/06/2012     10:02:08 AM   CARE MANAGEMENT NOTE 08/06/2012  Patient:  Miguel York, Miguel York   Account Number:  [id number]  Date Initiated:  08/03/2012  Documentation initiated by:  Junius Creamer  Subjective/Objective Assessment:   adm w heart failure     Action/Plan:   lives w family   Anticipated DC Date:  08/07/2012   Anticipated DC Plan:  HOME W HOME HEALTH SERVICES      DC Planning Services  CM consult      [facility] Choice  HOME HEALTH  DURABLE MEDICAL EQUIPMENT   Choice offered to / List presented to:  C-1 Patient   DME arranged  VEST - [facility] VEST      DME agency  OTHER - SEE NOTE     HH arranged  HH-1 RN  HH-10 DISEASE MANAGEMENT      HH agency  [facility]   Status of service:   Medicare Important Message given?   (If response is "NO", the following Medicare IM given date fields will be blank) Date Medicare IM given:   Date Additional Medicare IM given:    Discharge Disposition:  HOME W HOME HEALTH SERVICES  Per UR Regulation:  Reviewed for med. necessity/level of care/duration of stay  If discussed at Long Length of Stay Meetings, dates discussed:    Comments:  9/13 110a debbie Raimi Guillermo rn,bsn will alert marie w [facility] that for dc on 9-14. [facility] has been ordered from [employer]. forms faxed and have spoken w [facility] rep.  9/12 11:54a debbie Rane Blitch rn,bsn saw dr bensimhon note that will need hhc. gave pt and wife hhc agency list in [place] no pref, ref to [employer] w [facility] for hhrn for chf follow up.copy of hhc agency list on chart.  9/10 13:10p debbie Cecillia Menees rn,bsn [phone] will moniter for dc needs as pt progresses.

## 2012-08-04 DIAGNOSIS — I5022 Chronic systolic (congestive) heart failure: Secondary | ICD-10-CM

## 2012-08-04 LAB — RETICULOCYTES: Retic Count, Absolute: 49.5 10*3/uL (ref 19.0–186.0)

## 2012-08-04 LAB — BASIC METABOLIC PANEL
BUN: 28 mg/dL — ABNORMAL HIGH (ref 6–23)
GFR calc Af Amer: 88 mL/min — ABNORMAL LOW (ref >90)
GFR calc non Af Amer: 76 mL/min — ABNORMAL LOW (ref >90)
Potassium: 3.6 mEq/L (ref 3.5–5.1)
Sodium: 129 mEq/L — ABNORMAL LOW (ref 135–145)

## 2012-08-04 LAB — IRON AND TIBC
Iron: 19 ug/dL — ABNORMAL LOW (ref 42–135)
Saturation Ratios: 11 % — ABNORMAL LOW (ref 20–55)
UIBC: 158 ug/dL (ref 125–400)

## 2012-08-04 LAB — FERRITIN: Ferritin: 158 ng/mL (ref 22–322)

## 2012-08-04 LAB — CARBOXYHEMOGLOBIN: Carboxyhemoglobin: 1.8 % — ABNORMAL HIGH (ref 0.5–1.5)

## 2012-08-04 MED ORDER — FUROSEMIDE 10 MG/ML IJ SOLN
80.0000 mg | Freq: Two times a day (BID) | INTRAMUSCULAR | Status: AC
  Administered 2012-08-04: 80 mg via INTRAVENOUS
  Filled 2012-08-04: qty 8

## 2012-08-04 MED ORDER — ENOXAPARIN SODIUM 40 MG/0.4ML ~~LOC~~ SOLN
40.0000 mg | Freq: Every day | SUBCUTANEOUS | Status: DC
  Administered 2012-08-04 – 2012-08-07 (×4): 40 mg via SUBCUTANEOUS
  Filled 2012-08-04 (×5): qty 0.4

## 2012-08-04 MED ORDER — CARVEDILOL 3.125 MG PO TABS
3.1250 mg | ORAL_TABLET | Freq: Two times a day (BID) | ORAL | Status: DC
  Administered 2012-08-04 – 2012-08-05 (×4): 3.125 mg via ORAL
  Filled 2012-08-04 (×7): qty 1

## 2012-08-04 MED ORDER — FUROSEMIDE 10 MG/ML IJ SOLN
80.0000 mg | Freq: Once | INTRAMUSCULAR | Status: AC
  Administered 2012-08-04: 80 mg via INTRAVENOUS

## 2012-08-04 MED ORDER — LISINOPRIL 2.5 MG PO TABS
2.5000 mg | ORAL_TABLET | Freq: Every day | ORAL | Status: DC
  Administered 2012-08-04: 2.5 mg via ORAL
  Filled 2012-08-04 (×2): qty 1

## 2012-08-04 MED ORDER — DIGOXIN 125 MCG PO TABS
0.1250 mg | ORAL_TABLET | Freq: Every day | ORAL | Status: DC
  Administered 2012-08-04 – 2012-08-08 (×5): 0.125 mg via ORAL
  Filled 2012-08-04 (×5): qty 1

## 2012-08-04 NOTE — Progress Notes (Signed)
CARDIOTHORACIC SURGERY PROGRESS NOTE   Subjective: Miguel York is well known to me from his recent hospitalization and surgery. He reports feeling much better since he was hospitalized for diuresis. He has mild residual soreness in his chest which is appropriate at this point from his previous surgery. Since hospital discharge she has been able to abstain from any tobacco or alcohol use.  Objective: Vital signs in last 24 hours: Temp:  [97.7 F (36.5 C)-98.2 F (36.8 C)] 97.8 F (36.6 C) (09/11 0800) Pulse Rate:  [62-88] 78  (09/11 0800) Cardiac Rhythm:  [-] Normal sinus rhythm;Heart block (09/11 0800) Resp:  [16-23] 18  (09/11 0800) BP: (93-123)/(49-72) 109/60 mmHg (09/11 0800) SpO2:  [90 %-98 %] 98 % (09/11 0800) Weight:  [76 kg (167 lb 8.8 oz)-79 kg (174 lb 2.6 oz)] 76 kg (167 lb 8.8 oz) (09/11 0500)  Physical Exam:  Rhythm:   Sinus  Breath sounds: Few bibasilar rales  Heart sounds:  Regular rate and rhythm without murmur  Incisions:  Clean and dry and healing nicely  Abdomen:  Soft and nontender  Extremities:  Warm and well-perfused with minimal lower extremity edema   Intake/Output from previous day: 09/10 0701 - 09/11 0700 In: 714.4 [P.O.:480; I.V.:226.4; IV Piggyback:8] Out: 5425 [Urine:5425] Intake/Output this shift: Total I/O In: 23 [I.V.:23] Out: 1225 [Urine:1225]  Lab Results:  Basename 08/03/12 1327 08/02/12 1526  WBC 5.9 2.5*  HGB 8.9* 17.8*  HCT 25.9* 51.7  PLT 230 86*   BMET:  Basename 08/04/12 0420 08/03/12 1327  NA 129* 126*  K 3.6 5.0  CL 93* 93*  CO2 29 26  GLUCOSE 110* 125*  BUN 28* 37*  CREATININE 1.05 1.27  CALCIUM 8.7 9.0    CBG (last 3)  No results found for this basename: GLUCAP:3 in the last 72 hours PT/INR:  No results found for this basename: LABPROT,INR in the last 72 hours  CXR:  *RADIOLOGY REPORT*   Clinical Data: The liver surgery approximately 2 weeks ago.  Follow- up evaluation.   CHEST - 2 VIEW   Comparison:  07/25/2012.   Findings: There has been a previous median sternotomy. Wire sternal sutures and left atrial clip appear unchanged.  There is cardiomegaly.  There are no edematous changes.  There is improved aeration of the lung bases.  There are small bilateral pleural effusions (left greater than right).  There is no pneumothorax. The sheath previously seen within the right internal jugular vein has been removed.   IMPRESSION: Small bilateral pleural effusions (left greater than right).  No pneumothorax. Improved bibasilar aeration.     Original Report Authenticated By: Rolla Plate, M.D.     Transthoracic Echocardiography  Patient:    Miguel, York MR #:       [id number] Study Date: 08/03/2012 Gender:     M Age:        61 Height:     182.9cm Weight:     79kg BSA:        2.45m^2 Pt. Status: Room:       2927    PERFORMING   [place], [facility]  SONOGRAPHER  [facility], RDCS  ORDERING     Clegg, Amy cc:  ------------------------------------------------------------ LV EF: 25%  ------------------------------------------------------------ Indications:      CHF - 428.0.  ------------------------------------------------------------ History:   PMH:  ETOH abuse.  Cardiomyopathy of unknown etiology.  Chronic obstructive pulmonary disease.  Risk factors:  Current tobacco use.  ------------------------------------------------------------ Study Conclusions  - Left ventricle:  Markedly abnormal septal motion The cavity   size was severely dilated. Wall thickness was increased in   a pattern of mild LVH. The estimated ejection fraction was   25%. Diffuse hypokinesis. - Mitral valve: S/P mitral valve repair with trivial   residual MR and normal diastolic gradients - Left atrium: The atrium was mildly dilated. - Right atrium: The atrium was mildly dilated. - Atrial septum: No defect or patent foramen ovale was   identified. - Tricuspid valve: Moderate  regurgitation. - Pulmonary arteries: PA peak pressure: 31mm Hg (S).  ------------------------------------------------------------ Labs, prior tests, procedures, and surgery: Coronary artery bypass grafting.     Mitral valve repair.  Transthoracic echocardiography.  M-mode, complete 2D, spectral Doppler, and color Doppler.  Height:  Height: 182.9cm. Height: 72in.  Weight:  Weight: 79kg. Weight: 173.8lb.  Body mass index:  BMI: 23.6kg/m^2.  Body surface area:    BSA: 2.18m^2.  Blood pressure:     99/54.  Patient status:  Inpatient.  Location:  ICU/CCU  ------------------------------------------------------------  ------------------------------------------------------------ Left ventricle:  Markedly abnormal septal motion The cavity size was severely dilated. Wall thickness was increased in a pattern of mild LVH. The estimated ejection fraction was 25%. Diffuse hypokinesis.  ------------------------------------------------------------ Aortic valve:   Mildly calcified leaflets.  ------------------------------------------------------------ Aorta:  The aorta was normal, not dilated, and non-diseased.   ------------------------------------------------------------ Mitral valve:  S/P mitral valve repair with trivial residual MR and normal diastolic gradients  Doppler:   Trivial regurgitation.    Valve area by pressure half-time: 3.38cm^2. Indexed valve area by pressure half-time: 1.69cm^2/m^2.    Mean gradient: 3mm Hg (D). Peak gradient: 12mm Hg (D).  ------------------------------------------------------------ Left atrium:  The atrium was mildly dilated.  ------------------------------------------------------------ Atrial septum:  No defect or patent foramen ovale was identified.  ------------------------------------------------------------ Right ventricle:  The cavity size was normal. Wall thickness was normal. Systolic function was  normal.  ------------------------------------------------------------ Pulmonic valve:    Doppler:   Mild regurgitation.  ------------------------------------------------------------ Tricuspid valve:   Doppler:   Moderate regurgitation.  ------------------------------------------------------------ Right atrium:  The atrium was mildly dilated.  ------------------------------------------------------------ Pericardium:  The pericardium was normal in appearance.   ------------------------------------------------------------ Post procedure conclusions Ascending Aorta:  - The aorta was normal, not dilated, and non-diseased.  ------------------------------------------------------------  2D measurements        Normal  Doppler measurements   Normal Left ventricle                 Main pulmonary LVID ED,   59.2 mm     43-52   artery chord,                         Pressure,    31 mm Hg  =30 PLAX                           S LVID ES,   48.8 mm     23-38   Mitral valve chord,                         Peak E vel  170 cm/s   ------ PLAX                           Peak A vel 98.6 cm/s   ------ FS, chord,   18 %      >29  Mean vel,  81.3 cm/s   ------ PLAX                           D LVPW, ED   12.5 mm     ------  Decelerati  239 ms     150-23 IVS/LVPW    1.1        <1.3    on time                0 ratio, ED                      Pressure     65 ms     ------ Ventricular septum             half-time IVS, ED    13.8 mm     ------  Mean          3 mm Hg  ------ Aorta                          gradient, Root diam,   35 mm     ------  D ED                             Peak         12 mm Hg  ------ Left atrium                    gradient, AP dim       53 mm     ------  D AP dim     2.64 cm/m^2 <2.2    Peak E/A    1.7        ------ index                          ratio                                Area (PHT) 3.38 cm^2   ------                                Area index 1.69 cm^2/m ------                                 (PHT)           ^2                                Annulus    47.2 cm     ------                                VTI                                Tricuspid valve  Regurg      229 cm/s   ------                                peak vel                                Peak RV-RA   21 mm Hg  ------                                gradient,                                S                                Systemic veins                                Estimated    10 mm Hg  ------                                CVP                                Right ventricle                                Pressure,    31 mm Hg  <30                                S   ------------------------------------------------------------ Prepared and Electronically Authenticated by  Charlton Haws 2013-09-10T15:41:[id number]   Assessment/Plan:  The patient seems to be doing better with medical tuneup for congestive heart failure. From a surgical standpoint he seems to be recovering uneventfully. Will continue to follow.     Heidi Maclin H 08/04/2012 11:02 AM

## 2012-08-04 NOTE — Progress Notes (Signed)
CVP increased to 12. Give one dose lasix 80 mg IV once.  Reinforced fluid restrictions.  Kota Ciancio NP-C 2:55 PM

## 2012-08-04 NOTE — Progress Notes (Signed)
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 His discharge weight was 167 pounds. Over the weekend he developed progressive dyspnea. He contacted Dr Langston Masker office and he was instrcuted to Increase lasix to 80 mg twice a day. He was evaluated by Dr Daleen Squibb 08/02/12 and had a 20 pound weight gain. Pertinent labs from 08/02/12 included creatinine increased to 1.4 from baseline 0.7 and sodium reduced to 122. Despite increasded diuretics in the community he continued to complain of dyspnea with exertion. 08/03/12 Direct admit to 2900.   Yesterday he was started on Milrinone at 0.125 mcg and given IV lasix 80 mg bid.  24 hour I/O  - 4.7 liters. Weight down 7 pounds over night. 08/03/12 ECHO EF 25% diffuse hypokinesis.  CO-OX 73.2  Cardiac Output 11.4 Cardiac Index 6.42  Denies SOB/PND. + Orthopnea.      Objective:   Weight Range:  Vital Signs:   Temp:  [97.7 F (36.5 C)-98.2 F (36.8 C)] 98.1 F (36.7 C) (09/11 0400) Pulse Rate:  [62-88] 71  (09/11 0600) Resp:  [16-23] 16  (09/10 2000) BP: (93-123)/(49-72) 110/59 mmHg (09/11 0600) SpO2:  [90 %-98 %] 90 % (09/11 0600) Weight:  [76 kg (167 lb 8.8 oz)-79 kg (174 lb 2.6 oz)] 76 kg (167 lb 8.8 oz) (09/11 0500)    Weight change: Filed Weights   08/03/12 1100 08/03/12 1211 08/04/12 0500  Weight: 79 kg (174 lb 2.6 oz) 79 kg (174 lb 2.6 oz) 76 kg (167 lb 8.8 oz)    Intake/Output:   Intake/Output Summary (Last 24 hours) at 08/04/12 0723 Last data filed at 08/04/12 0600  Gross per 24 hour  Intake 701.35 ml  Output   5425 ml  Net -4723.65 ml     Physical Exam: CVP 8-9 General:  Chronically ill appearing.  No resp difficulty HEENT: normal Neck: supple. JVP 9 . Carotids 2+ bilat; no bruits. No lymphadenopathy or thryomegaly appreciated. Cor: PMI nondisplaced. Regular rate & rhythm. No rubs, gallops or murmurs. Lungs: clear Abdomen: soft, nontender, nondistended. No hepatosplenomegaly. No bruits or masses. Good bowel sounds. Extremities: no cyanosis, clubbing, rash, R and LLE 1-2+ edema Ted hose on bilaterally  PICC in RUE Neuro: alert & orientedx3, cranial nerves grossly intact. moves all 4 extremities w/o difficulty. Affect pleasant  Telemetry: SR with PVCs  Labs: Basic Metabolic Panel:  Lab 08/04/12 4098 08/03/12 1327 08/02/12 1526 07/28/12 0814  NA 129* 126* 122* 128*  K 3.6 5.0 4.9 4.7  CL 93* 93* 88* 94*  CO2 29 26 25 26   GLUCOSE 110* 125* 166* 188*  BUN 28* 37* 42* 19  CREATININE 1.05 1.27 1.41* 0.88  CALCIUM 8.7 9.0 9.0 --  MG -- -- -- --  PHOS -- -- -- --    Liver Function Tests:  Lab 08/02/12 1526  AST 141*  ALT 82*  ALKPHOS 99  BILITOT 0.8  PROT 8.0  ALBUMIN 2.9*   No results found for this basename: LIPASE:5,AMYLASE:5 in  the last 168 hours No results found for this basename: AMMONIA:3 in the last 168 hours  CBC:  Lab 08/03/12 1327 08/02/12 1526 07/28/12 0814  WBC 5.9 2.5* 5.7  NEUTROABS -- 1.4* --  HGB 8.9* 17.8* 9.7*  HCT 25.9* 51.7 28.4*  MCV 94.9 93.8 93.4  PLT 230 86* 135*    Cardiac Enzymes: No results found for this basename: CKTOTAL:5,CKMB:5,CKMBINDEX:5,TROPONINI:5 in the last 168 hours  BNP: BNP (last 3 results)  Basename 08/03/12 1327 07/03/12 2330 05/11/12 1451  PROBNP 1072.0* 1670.0* 1842.0*     Other results   Imaging: Dg Chest 2 View  08/02/2012  *RADIOLOGY REPORT*  Clinical Data: The liver surgery approximately 2 weeks ago.  Follow- up evaluation.  CHEST - 2 VIEW  Comparison: 07/25/2012.   Findings: There has been a previous median sternotomy. Wire sternal sutures and left atrial clip appear unchanged.  There is cardiomegaly.  There are no edematous changes.  There is improved aeration of the lung bases.  There are small bilateral pleural effusions (left greater than right).  There is no pneumothorax. The sheath previously seen within the right internal jugular vein has been removed.  IMPRESSION: Small bilateral pleural effusions (left greater than right).  No pneumothorax. Improved bibasilar aeration.   Original Report Authenticated By: Rolla Plate, M.D.       Medications:     Scheduled Medications:    . aspirin EC  325 mg Oral Daily  . enoxaparin (LOVENOX) injection  30 mg Subcutaneous QHS  . furosemide      . furosemide      . furosemide  80 mg Intravenous BID  . potassium chloride SA      . potassium chloride  20 mEq Oral BID  . sodium chloride  10-40 mL Intracatheter Q12H  . sodium chloride  3 mL Intravenous Q12H     Infusions:    . milrinone 0.125 mcg/kg/min (08/03/12 2000)     PRN Medications:  sodium chloride, acetaminophen, ondansetron (ZOFRAN) IV, oxyCODONE, oxyCODONE, sodium chloride, sodium chloride, DISCONTD: oxycodone   Assessment:  1. A/C systolic heart failure EF 25%  2. CAD S/P CABG x3 07/23/12   LIMA to LAD, SVG to PDA, SVG to second of his marginal, SVG to first diagonal  3. Mitral regurgitation S/P MVR 07/23/12  4. Acute renal failure  5. Acute respiratory failure  6. Hyponatremia  7. Thrombocytopenia 8. Anemia    Plan/Discussion:    Much improved over night. Volume status much improved. CVP reduced from 15 to 8. Will stop Milrinone. Give one more dose of IV lasix 80 mg.  Start Lisinopril 2.5 mg po daily. Will start carvedilol tomorrow. Sodium improved.   Check FOB. Check anemia panel .  Dietitian consult pending.   Consult cardiac rehab. Will need HH follow up.    Length of Stay: 1  CLEGG,AMY 08/04/2012, 7:23  AM  Patient seen and examined with Tonye Becket, NP. We discussed all aspects of the encounter. I agree with the assessment and plan as stated above.   Much improved. Volume status improving. Cardiac output improved. Agree with stopping milrinone. Will need aggressive titration of afterload reducers as BP tolerates. Can switch to po lasix soon. Add low-dose spiro - watch carefully given tendency for hyperkalemia.   Zaniah Titterington,MD 8:17 AM

## 2012-08-04 NOTE — Progress Notes (Signed)
CARDIAC REHAB PHASE I   PRE:  Rate/Rhythm: 75 SR with PVCs    BP: sitting 125/78    SaO2: 100 RA  MODE:  Ambulation: 700 ft   POST:  Rate/Rhythm: 92 SR with PVCs (increased)    BP: sitting 125/52     SaO2: 99 RA  Tolerated well. Independent. Quick pace. Did not seem SOB and pt denied SOB. To recliner, which he was resistant to. Will f/u. Encouraged walking more. [phone]  Harriet Masson CES, ACSM

## 2012-08-05 DIAGNOSIS — I5023 Acute on chronic systolic (congestive) heart failure: Principal | ICD-10-CM

## 2012-08-05 DIAGNOSIS — Z9889 Other specified postprocedural states: Secondary | ICD-10-CM

## 2012-08-05 DIAGNOSIS — I2510 Atherosclerotic heart disease of native coronary artery without angina pectoris: Secondary | ICD-10-CM

## 2012-08-05 LAB — BASIC METABOLIC PANEL
BUN: 19 mg/dL (ref 6–23)
CO2: 31 mEq/L (ref 19–32)
Calcium: 8.8 mg/dL (ref 8.4–10.5)
Creatinine, Ser: 0.87 mg/dL (ref 0.50–1.35)
Glucose, Bld: 104 mg/dL — ABNORMAL HIGH (ref 70–99)

## 2012-08-05 LAB — CARBOXYHEMOGLOBIN: Total hemoglobin: 8.9 g/dL — ABNORMAL LOW (ref 13.5–18.0)

## 2012-08-05 LAB — MAGNESIUM: Magnesium: 2 mg/dL (ref 1.5–2.5)

## 2012-08-05 MED ORDER — FERROUS SULFATE 325 (65 FE) MG PO TABS
325.0000 mg | ORAL_TABLET | Freq: Three times a day (TID) | ORAL | Status: DC
  Filled 2012-08-05 (×4): qty 1

## 2012-08-05 MED ORDER — POTASSIUM CHLORIDE CRYS ER 20 MEQ PO TBCR: 40.0000 meq | EXTENDED_RELEASE_TABLET | Freq: Two times a day (BID) | ORAL | Status: DC

## 2012-08-05 MED ORDER — LISINOPRIL 2.5 MG PO TABS
2.5000 mg | ORAL_TABLET | Freq: Two times a day (BID) | ORAL | Status: DC
  Administered 2012-08-05: 2.5 mg via ORAL
  Filled 2012-08-05 (×3): qty 1

## 2012-08-05 MED ORDER — SPIRONOLACTONE 25 MG PO TABS
25.0000 mg | ORAL_TABLET | Freq: Every day | ORAL | Status: DC
  Administered 2012-08-05 – 2012-08-08 (×4): 25 mg via ORAL
  Filled 2012-08-05 (×4): qty 1

## 2012-08-05 MED ORDER — FERROUS SULFATE 325 (65 FE) MG PO TABS
325.0000 mg | ORAL_TABLET | Freq: Two times a day (BID) | ORAL | Status: DC
  Administered 2012-08-05 – 2012-08-08 (×6): 325 mg via ORAL
  Filled 2012-08-05 (×8): qty 1

## 2012-08-05 MED ORDER — FUROSEMIDE 10 MG/ML IJ SOLN
80.0000 mg | Freq: Two times a day (BID) | INTRAMUSCULAR | Status: DC
  Administered 2012-08-05: 80 mg via INTRAVENOUS
  Filled 2012-08-05 (×4): qty 8

## 2012-08-05 MED ORDER — ATORVASTATIN CALCIUM 40 MG PO TABS
40.0000 mg | ORAL_TABLET | Freq: Every day | ORAL | Status: DC
  Administered 2012-08-05 – 2012-08-07 (×3): 40 mg via ORAL
  Filled 2012-08-05 (×4): qty 1

## 2012-08-05 MED ORDER — FUROSEMIDE 10 MG/ML IJ SOLN
80.0000 mg | Freq: Three times a day (TID) | INTRAMUSCULAR | Status: DC
  Administered 2012-08-05: 80 mg via INTRAVENOUS

## 2012-08-05 NOTE — Progress Notes (Signed)
CARDIAC REHAB PHASE I   PRE:  Rate/Rhythm: 80 SR    BP: sitting 110/56    SaO2:   MODE:  Ambulation: 700 ft   POST:  Rate/Rhythm: 95 SR with PVCs    BP: sitting 121/77     SaO2:   Tolerated well with quick pace. No c/o, no SOB. Will f/u. [phone]  Harriet Masson CES, ACSM

## 2012-08-05 NOTE — Progress Notes (Signed)
MEDICARE-CERTIFIED HOME HEALTH AGENCIES [facility]   Agencies that are Medicare-Certified and affiliated with [facility]  Home Health Agency  Telephone Number Address  [facility]  The [facility] has ownership interest in this company; however, you are under no obligation to use this agency. [phone]  [address] [place], [place] [address]  [email or web address]    Agencies that are Medicare-Certified and are not affiliated with [facility] Agency Telephone Number Address  Aldine Contes [phone] Fax [phone] [address] [place], [place]  [address]  [facility] [phone] [address] [place], [place] [address]  [facility]  [phone] Fax [phone] [address]  [place], [place]  [address]  [facility] [phone] or [phone] [address] [place], [place] [address]  [facility] [phone] or [phone] [address] [place], [place]  [address]

## 2012-08-05 NOTE — Progress Notes (Signed)
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 His discharge weight was 167 pounds. Over the weekend he developed progressive dyspnea. He contacted Dr Langston Masker office and he was instrcuted to Increase lasix to 80 mg twice a day. He was evaluated by Dr Daleen Squibb 08/02/12 and had a 20 pound weight gain. Pertinent labs from 08/02/12 included creatinine increased to 1.4 from baseline 0.7 and sodium reduced to 122. Despite increased diuretics in the community he continued to complain of dyspnea with exertion. 08/03/12 Direct admit to 2900. Admit weight 174 pounds.  Received Milrinone for 24 hours. Milrinone stopped yesterday. Yesterday he received  IV lasix 80 mg bid.  24 hour I/O  - 3.3 liters. Overall weight down 10 pounds.    08/03/12 ECHO EF 25% diffuse hypokinesis.  CO-OX 94 (?)  Feels better. Appetite improved. Denies SOB/PND.   Objective:   Weight Range:  Vital Signs:   Temp:  [97.8 F (36.6 C)-98.6 F (37 C)] 98.3 F (36.8 C) (09/12 0352) Pulse Rate:  [71-102] 92  (09/12 0500) Resp:  [18-19] 18  (09/11 2200) BP: (94-125)/(50-77) 105/67 mmHg (09/12 0500) SpO2:  [92 %-100 %] 93 % (09/12 0500) Weight:  [74.39 kg (164 lb)] 74.39 kg (164 lb) (09/12 0500)    Weight change: Filed Weights   08/03/12 1211 08/04/12 0500 08/05/12 0500  Weight: 79 kg (174 lb 2.6 oz) 76 kg (167 lb 8.8 oz) 74.39 kg (164 lb)    Intake/Output:   Intake/Output Summary (Last 24 hours) at 08/05/12 0714 Last data filed at 08/05/12 0600  Gross  per 24 hour  Intake   1009 ml  Output   4375 ml  Net  -3366 ml     Physical Exam: CVP 12 General:  Chronically ill appearing.  No resp difficulty wife at bedside HEENT: normal Neck: supple. JVP 10-11 . Carotids 2+ bilat; no bruits. No lymphadenopathy or thryomegaly appreciated. Cor: PMI nondisplaced. Regular rate & rhythm. No rubs, gallops or murmurs. Lungs: clear Abdomen: soft, nontender, nondistended. No hepatosplenomegaly. No bruits or masses. Good bowel sounds. Extremities: no cyanosis, clubbing, rash, R and LLE 2+ edema Ted hose on bilaterally  PICC in RUE Neuro: alert & orientedx3, cranial nerves grossly intact. moves all 4 extremities w/o difficulty. Affect pleasant  Telemetry: SR with PVCs  Labs: Basic Metabolic Panel:  Lab 08/05/12 1610 08/04/12 0420 08/03/12 1327 08/02/12 1526  NA 130* 129* 126* 122*  K 3.5 3.6 5.0 4.9  CL 93* 93* 93* 88*  CO2 31 29 26 25   GLUCOSE 104* 110* 125* 166*  BUN 19 28* 37* 42*  CREATININE 0.87 1.05 1.27 1.41*  CALCIUM 8.8 8.7 9.0 --  MG -- -- -- --  PHOS -- -- -- --    Liver Function Tests:  Lab 08/02/12 1526  AST 141*  ALT 82*  ALKPHOS 99  BILITOT 0.8  PROT 8.0  ALBUMIN 2.9*   No results found for this basename: LIPASE:5,AMYLASE:5 in the last 168 hours No  results found for this basename: AMMONIA:3 in the last 168 hours  CBC:  Lab 08/03/12 1327 08/02/12 1526  WBC 5.9 2.5*  NEUTROABS -- 1.4*  HGB 8.9* 17.8*  HCT 25.9* 51.7  MCV 94.9 93.8  PLT 230 86*    Cardiac Enzymes: No results found for this basename: CKTOTAL:5,CKMB:5,CKMBINDEX:5,TROPONINI:5 in the last 168 hours  BNP: BNP (last 3 results)  Basename 08/03/12 1327 07/03/12 2330 05/11/12 1451  PROBNP 1072.0* 1670.0* 1842.0*     Other results   Imaging: No results found.   Medications:     Scheduled Medications:    . aspirin EC  325 mg Oral Daily  . carvedilol  3.125 mg Oral BID WC  . digoxin  0.125 mg Oral Daily  . enoxaparin (LOVENOX) injection   40 mg Subcutaneous QHS  . furosemide  80 mg Intravenous BID  . furosemide  80 mg Intravenous Once  . lisinopril  2.5 mg Oral Daily  . potassium chloride  20 mEq Oral BID  . sodium chloride  10-40 mL Intracatheter Q12H  . sodium chloride  3 mL Intravenous Q12H  . DISCONTD: enoxaparin (LOVENOX) injection  30 mg Subcutaneous QHS  . DISCONTD: furosemide  80 mg Intravenous BID    Infusions:    . DISCONTD: milrinone 0.125 mcg/kg/min (08/03/12 2000)    PRN Medications: sodium chloride, acetaminophen, ondansetron (ZOFRAN) IV, oxyCODONE, oxyCODONE, sodium chloride, sodium chloride   Assessment:  1. A/C systolic heart failure EF 25%  2. CAD S/P CABG x3 07/23/12   LIMA to LAD, SVG to PDA, SVG to second of his marginal, SVG to first diagonal  3. Mitral regurgitation S/P MVR 07/23/12  4. Acute renal failure  5. Acute respiratory failure  6. Hyponatremia  7. Thrombocytopenia 8. Iron Deficient Anemia    Plan/Discussion:    Volume status remains elevated despite Lasix 80 mg IV bid. Will continue to diurese. Increase lisinopril 2.5 mg bid. Add spiro. Start carvedilol tomorrow. Supplement K. Check Magnesium.   Add statin. Lipitor 40 mg daily.   Iron level reduced (19) TIBC 177. Add Ferrous Sulfate 325 mg bid. Start colace 100 bid.  Dietitian recommendations appreciated.    Cardiac rehab following.  Will need HH follow up.    Length of Stay: 2  CLEGG,AMY 08/05/2012, 7:14 AM  Patient seen and examined with Tonye Becket, NP. We discussed all aspects of the encounter. I agree with the assessment and plan as stated above.   Improving but still volume overloaded. Agree with continued diuresis and increasing lisinopril and adding spiro. Supp iron.    Keep in stepdown today. Echo reviewed. EF 25%. MV repair stable. Mod TR.   Carnelia Oscar,MD 9:01 AM

## 2012-08-06 DIAGNOSIS — Z951 Presence of aortocoronary bypass graft: Secondary | ICD-10-CM

## 2012-08-06 LAB — BASIC METABOLIC PANEL
CO2: 33 mEq/L — ABNORMAL HIGH (ref 19–32)
Calcium: 8.9 mg/dL (ref 8.4–10.5)
Chloride: 91 mEq/L — ABNORMAL LOW (ref 96–112)
Glucose, Bld: 111 mg/dL — ABNORMAL HIGH (ref 70–99)
Sodium: 130 mEq/L — ABNORMAL LOW (ref 135–145)

## 2012-08-06 LAB — CARBOXYHEMOGLOBIN
Carboxyhemoglobin: 1.5 % (ref 0.5–1.5)
Methemoglobin: 0.7 % (ref 0.0–1.5)
O2 Saturation: 57.6 %
O2 Saturation: 63.4 %
Total hemoglobin: 12.9 g/dL — ABNORMAL LOW (ref 13.5–18.0)
Total hemoglobin: 12.9 g/dL — ABNORMAL LOW (ref 13.5–18.0)

## 2012-08-06 MED ORDER — ASPIRIN EC 81 MG PO TBEC
81.0000 mg | DELAYED_RELEASE_TABLET | Freq: Every day | ORAL | Status: DC
  Administered 2012-08-06 – 2012-08-08 (×3): 81 mg via ORAL
  Filled 2012-08-06 (×3): qty 1

## 2012-08-06 MED ORDER — BISOPROLOL FUMARATE 5 MG PO TABS
2.5000 mg | ORAL_TABLET | Freq: Every day | ORAL | Status: DC
  Administered 2012-08-06 – 2012-08-08 (×3): 2.5 mg via ORAL
  Filled 2012-08-06 (×3): qty 0.5

## 2012-08-06 MED ORDER — FUROSEMIDE 10 MG/ML IJ SOLN
80.0000 mg | Freq: Two times a day (BID) | INTRAMUSCULAR | Status: AC
  Administered 2012-08-06: 80 mg via INTRAVENOUS

## 2012-08-06 MED ORDER — POTASSIUM CHLORIDE CRYS ER 20 MEQ PO TBCR
40.0000 meq | EXTENDED_RELEASE_TABLET | Freq: Once | ORAL | Status: AC
  Administered 2012-08-06: 40 meq via ORAL
  Filled 2012-08-06: qty 2

## 2012-08-06 MED ORDER — LOSARTAN POTASSIUM 25 MG PO TABS
25.0000 mg | ORAL_TABLET | Freq: Every day | ORAL | Status: DC
  Administered 2012-08-06 – 2012-08-08 (×3): 25 mg via ORAL
  Filled 2012-08-06 (×3): qty 1

## 2012-08-06 MED ORDER — FUROSEMIDE 40 MG PO TABS
40.0000 mg | ORAL_TABLET | Freq: Two times a day (BID) | ORAL | Status: DC
Start: 2012-08-07 — End: 2012-08-08
  Administered 2012-08-07 – 2012-08-08 (×3): 40 mg via ORAL
  Filled 2012-08-06 (×5): qty 1

## 2012-08-06 NOTE — Progress Notes (Signed)
Advanced Heart Failure Rounding Note   Subjective:   Miguel York is a 61 year old with a history of chronic systolic heart failure EF 15 %, ICM, CAD S/P CABG x 3 07/23/12 (LIMA to LAD, SVG to PDA, SVG to second of his marginal, SVG to first diagonal, and additionally had clipping of the left atrial appendage), Quit smoking 1 month ago, COPD, Moderate Miguel/TR S/P MVR 07/23/12, and quit drinking 1 month ago (18-24 beers per day)  Miguel York was discharged from [facility] 07/28/12 after CABG x 3, MVR repair, and clipping of atrial appendage. His discharge weight was 167 pounds. Over the weekend he developed progressive dyspnea. He contacted Dr Langston Masker office and he was instrcuted to Increase lasix to 80 mg twice a day. He was evaluated by Dr Daleen Squibb 08/02/12 and had a 20 pound weight gain. Pertinent labs from 08/02/12 included creatinine increased to 1.4 from baseline 0.7 and sodium reduced to 122. Despite increased diuretics in the community he continued to complain of dyspnea with exertion. 08/03/12 Direct admit to 2900. Admit weight 174 pounds.  Received Milrinone for 24 hours. Milrinone stopped 08/04/12. Continues to receive  IV lasix 80 mg bid.  Yesterday Spironolactone 25 mg added and Lisinopril increased to 2.5mg  BID.24 hour I/O  - 2.8 liters. Overall weight down 14 pounds. Able to ambulate 700 feet without difficulty.     08/03/12 ECHO EF 25% diffuse hypokinesis.  CO-OX 73>94>57> Repeat CO-OX  CO 3.8 CI 1.99  Denies SOB/PND/Orthopnea/dizziness       Objective:   Weight Range:  Vital Signs:   Temp:  [97.8 F (36.6 C)-99 F (37.2 C)] 99 F (37.2 C) (09/13 0341) Pulse Rate:  [71-77] 77  (09/12 1246) Resp:  [18-20] 20  (09/13 0000) BP: (88-115)/(41-63) 98/41 mmHg (09/13 0442) SpO2:  [91 %-96 %] 91 % (09/13 0341) Weight:  [72.6 kg (160 lb 0.9 oz)] 72.6 kg (160 lb 0.9 oz) (09/13 0450) Last BM Date: 08/04/12  Weight change: Filed Weights   08/04/12 0500 08/05/12 0500 08/06/12 0450  Weight: 76 kg  (167 lb 8.8 oz) 74.39 kg (164 lb) 72.6 kg (160 lb 0.9 oz)    Intake/Output:   Intake/Output Summary (Last 24 hours) at 08/06/12 0717 Last data filed at 08/06/12 0500  Gross per 24 hour  Intake    796 ml  Output   3680 ml  Net  -2884 ml     Physical Exam: CVP 9-10  General:  Chronically ill appearing.  No resp difficulty wife at bedside HEENT: normal Neck: supple. JVP 9 . Carotids 2+ bilat; no bruits. No lymphadenopathy or thryomegaly appreciated. Cor: PMI nondisplaced. Regular rate & rhythm. No rubs, gallops or murmurs. Lungs: IW throughout Abdomen: soft, nontender, nondistended. No hepatosplenomegaly. No bruits or masses. Good bowel sounds. Extremities: no cyanosis, clubbing, rash, R and LLE 2+ edema Ted hose on bilaterally  PICC in RUE Neuro: alert & orientedx3, cranial nerves grossly intact. moves all 4 extremities w/o difficulty. Affect pleasant  Telemetry: SR with PVCs  Labs: Basic Metabolic Panel:  Lab 08/06/12 1610 08/05/12 0800 08/05/12 0315 08/04/12 0420 08/03/12 1327 08/02/12 1526  NA 130* -- 130* 129* 126* 122*  K 3.5 -- 3.5 3.6 5.0 4.9  CL 91* -- 93* 93* 93* 88*  CO2 33* -- 31 29 26 25   GLUCOSE 111* -- 104* 110* 125* 166*  BUN 16 -- 19 28* 37* 42*  CREATININE 0.90 -- 0.87 1.05 1.27 1.41*  CALCIUM 8.9 -- 8.8 8.7 -- --  MG --  2.0 -- -- -- --  PHOS -- -- -- -- -- --    Liver Function Tests:  Lab 08/02/12 1526  AST 141*  ALT 82*  ALKPHOS 99  BILITOT 0.8  PROT 8.0  ALBUMIN 2.9*   No results found for this basename: LIPASE:5,AMYLASE:5 in the last 168 hours No results found for this basename: AMMONIA:3 in the last 168 hours  CBC:  Lab 08/03/12 1327 08/02/12 1526  WBC 5.9 2.5*  NEUTROABS -- 1.4*  HGB 8.9* 17.8*  HCT 25.9* 51.7  MCV 94.9 93.8  PLT 230 86*    Cardiac Enzymes: No results found for this basename: CKTOTAL:5,CKMB:5,CKMBINDEX:5,TROPONINI:5 in the last 168 hours  BNP: BNP (last 3 results)  Basename 08/03/12 1327 07/03/12 2330  05/11/12 1451  PROBNP 1072.0* 1670.0* 1842.0*     Other results   Imaging: No results found.   Medications:     Scheduled Medications:    . aspirin EC  325 mg Oral Daily  . atorvastatin  40 mg Oral q1800  . carvedilol  3.125 mg Oral BID WC  . digoxin  0.125 mg Oral Daily  . enoxaparin (LOVENOX) injection  40 mg Subcutaneous QHS  . ferrous sulfate  325 mg Oral BID WC  . furosemide  80 mg Intravenous BID  . lisinopril  2.5 mg Oral BID  . sodium chloride  10-40 mL Intracatheter Q12H  . sodium chloride  3 mL Intravenous Q12H  . spironolactone  25 mg Oral Daily  . DISCONTD: ferrous sulfate  325 mg Oral TID WC  . DISCONTD: furosemide  80 mg Intravenous Q8H  . DISCONTD: lisinopril  2.5 mg Oral Daily  . DISCONTD: potassium chloride  20 mEq Oral BID  . DISCONTD: potassium chloride  40 mEq Oral BID    Infusions:    PRN Medications: sodium chloride, acetaminophen, ondansetron (ZOFRAN) IV, oxyCODONE, oxyCODONE, sodium chloride, sodium chloride   Assessment:  1. A/C systolic heart failure EF 25%  2. CAD S/P CABG x3 07/23/12   LIMA to LAD, SVG to PDA, SVG to second of his marginal, SVG to first diagonal  3. Mitral regurgitation S/P MVR 07/23/12  4. Acute renal failure  5. Acute respiratory failure  6. Hyponatremia  7. Thrombocytopenia 8. Iron Deficient Anemia    Plan/Discussion:    Volume status improving. CVP down to 9. CO-OX 57 will repeat Weight down 14 pounds.  Give one more dose of Iasix and transition to po lasix 40 mg bid. Stop lisinopril and start  Losartan 25 mg daily to decrease airway irritation.  Stop carvedilol and start bisoprolol 2.5 mg day due to COPD.   Dietitian recommendations appreciated.    Cardiac rehab following.    HH for discharged. [facility] to follow for disease management and telemonitoring.  Length of Stay: 3  CLEGG,AMY 08/06/2012, 7:17 AM   Patient seen and examined with Tonye Becket, NP. We discussed all aspects of the encounter. I agree with  the assessment and plan as stated above.   He is clinically improved but still very tenuous with low BP, prominent s3 on exam and frequent multifocal PVCs. Volume status much improved. CVP now about 9. Will try to diurese just a bit more today. Possibly home tomorrow with VERY close f/u and we have ordered LifeVest.  Bisoprolol 2.5 Losartan 25 Dig 0.125 Lasix 40 bid Spiro 25 Metolazone 2.5 prn  F/u HF clinic on Wednesday.  Samora Jernberg,MD 8:09 AM

## 2012-08-06 NOTE — Progress Notes (Signed)
CARDIAC REHAB PHASE I   PRE:  Rate/Rhythm: 79 SR with many multifocal PVCs    BP: sitting 106/54    SaO2:   MODE:  Ambulation: 1050 ft   POST:  Rate/Rhythm: 89 SR with decreased # PVCs    BP: sitting 124/66     SaO2:   Tolerated well. No SOB or complaints. PVCs actually decreased to almost none after walk. Return to bed (doesn't like chair). Encouraged more walking.  [id number]  Harriet Masson CES, ACSM

## 2012-08-06 NOTE — Progress Notes (Signed)
TCTS BRIEF PROGRESS NOTE   Progress noted  Miguel York,Miguel York 08/06/2012 2:11 PM

## 2012-08-07 LAB — BASIC METABOLIC PANEL
BUN: 17 mg/dL (ref 6–23)
CO2: 31 mEq/L (ref 19–32)
Calcium: 9 mg/dL (ref 8.4–10.5)
Creatinine, Ser: 0.82 mg/dL (ref 0.50–1.35)
GFR calc non Af Amer: >90 mL/min (ref >90)
Glucose, Bld: 108 mg/dL — ABNORMAL HIGH (ref 70–99)

## 2012-08-07 NOTE — Progress Notes (Signed)
CARDIAC REHAB PHASE I   PRE:  Rate/Rhythm: 71 SR PVC's  BP:  Supine:   Sitting: 95/55  Standing:    SaO2: 97 RA  MODE:  Ambulation: 1050 ft   POST:  Rate/Rhythem: 93 SR PVC's  BP:  Supine:   Sitting: 118/53  Standing:    SaO2: 100 RA 1125-1150 Tolerated ambulation well without c/o Gait steady VS stable. Fast pace walk. Reviewed with pt signs and symptoms of CHF, daily weights and low sodium diet.He voices understanding.  Beatrix Fetters

## 2012-08-07 NOTE — Progress Notes (Signed)
Advanced Heart Failure Rounding Note   Subjective:   Miguel York is a 61 year old with a history of chronic systolic heart failure EF 15 %, ICM, CAD S/P CABG x 3 07/23/12 (LIMA to LAD, SVG to PDA, SVG to second of his marginal, SVG to first diagonal, and additionally had clipping of the left atrial appendage), Quit smoking 1 month ago, COPD, Moderate Miguel/TR S/P MVR 07/23/12, and quit drinking 1 month ago (18-24 beers per day)  Miguel York was discharged from [facility] 07/28/12 after CABG x 3, MVR repair, and clipping of atrial appendage. His discharge weight was 167 pounds. Over the weekend he developed progressive dyspnea. He contacted Dr Langston Masker office and he was instrcuted to Increase lasix to 80 mg twice a day. He was evaluated by Dr Daleen Squibb 08/02/12 and had a 20 pound weight gain. Pertinent labs from 08/02/12 included creatinine increased to 1.4 from baseline 0.7 and sodium reduced to 122. Despite increased diuretics in the community he continued to complain of dyspnea with exertion. 08/03/12 Direct admit to 2900. Admit weight 174 pounds.  Received Milrinone for 24 hours. Milrinone stopped 08/04/12.  08/03/12 ECHO EF 25% diffuse hypokinesis.  Continues to improve. Ambulating unit. Co-ox 63%. Weight down to 158 (174 on admit). Continues with frequent polymorphic PVCs. LifeVest ordered.   Denies SOB/PND/Orthopnea/dizziness       Objective:   Weight Range:  Vital Signs:   Temp:  [97.3 F (36.3 C)-98.9 F (37.2 C)] 97.3 F (36.3 C) (09/14 0831) Pulse Rate:  [79-85] 80  (09/13 2000) Resp:  [16-20] 16  (09/14 0831) BP: (92-115)/(42-63) 92/47 mmHg (09/14 0800) SpO2:  [92 %-96 %] 96 % (09/14 0831) Weight:  [72.1 kg (158 lb 15.2 oz)-72.8 kg (160 lb 7.9 oz)] 72.1 kg (158 lb 15.2 oz) (09/14 0700) Last BM Date: 08/04/12  Weight change: Filed Weights   08/06/12 0450 08/07/12 0454 08/07/12 0700  Weight: 72.6 kg (160 lb 0.9 oz) 72.8 kg (160 lb 7.9 oz) 72.1 kg (158 lb 15.2 oz)     Intake/Output:   Intake/Output Summary (Last 24 hours) at 08/07/12 1039 Last data filed at 08/07/12 0700  Gross per 24 hour  Intake    340 ml  Output   3150 ml  Net  -2810 ml     Physical Exam: CVP 6 (checked personally)  General:  Much improved.  No resp difficulty wife at bedside HEENT: normal Neck: supple. JVP 6 . Carotids 2+ bilat; no bruits. No lymphadenopathy or thryomegaly appreciated. Cor: PMI nondisplaced. Regular rate & rhythm. No rubs, gallops or murmurs. Lungs: IW throughout Abdomen: soft, nontender, nondistended. No hepatosplenomegaly. No bruits or masses. Good bowel sounds. Extremities: no cyanosis, clubbing, rash, edema Ted hose on bilaterally  PICC in RUE Neuro: alert & orientedx3, cranial nerves grossly intact. moves all 4 extremities w/o difficulty. Affect pleasant  Telemetry: SR with PVCs  Labs: Basic Metabolic Panel:  Lab 08/07/12 2956 08/06/12 0355 08/05/12 0800 08/05/12 0315 08/04/12 0420 08/03/12 1327  NA 130* 130* -- 130* 129* 126*  K 3.7 3.5 -- 3.5 3.6 5.0  CL 91* 91* -- 93* 93* 93*  CO2 31 33* -- 31 29 26   GLUCOSE 108* 111* -- 104* 110* 125*  BUN 17 16 -- 19 28* 37*  CREATININE 0.82 0.90 -- 0.87 1.05 1.27  CALCIUM 9.0 8.9 -- 8.8 -- --  MG -- -- 2.0 -- -- --  PHOS -- -- -- -- -- --    Liver Function Tests:  Lab 08/02/12 1526  AST 141*  ALT 82*  ALKPHOS 99  BILITOT 0.8  PROT 8.0  ALBUMIN 2.9*   No results found for this basename: LIPASE:5,AMYLASE:5 in the last 168 hours No results found for this basename: AMMONIA:3 in the last 168 hours  CBC:  Lab 08/03/12 1327 08/02/12 1526  WBC 5.9 2.5*  NEUTROABS -- 1.4*  HGB 8.9* 17.8*  HCT 25.9* 51.7  MCV 94.9 93.8  PLT 230 86*    Cardiac Enzymes: No results found for this basename: CKTOTAL:5,CKMB:5,CKMBINDEX:5,TROPONINI:5 in the last 168 hours  BNP: BNP (last 3 results)  Basename 08/03/12 1327 07/03/12 2330 05/11/12 1451  PROBNP 1072.0* 1670.0* 1842.0*     Other results    Imaging: No results found.   Medications:     Scheduled Medications:    . aspirin EC  81 mg Oral Daily  . atorvastatin  40 mg Oral q1800  . bisoprolol  2.5 mg Oral Daily  . digoxin  0.125 mg Oral Daily  . enoxaparin (LOVENOX) injection  40 mg Subcutaneous QHS  . ferrous sulfate  325 mg Oral BID WC  . furosemide  40 mg Oral BID  . losartan  25 mg Oral Daily  . sodium chloride  10-40 mL Intracatheter Q12H  . sodium chloride  3 mL Intravenous Q12H  . spironolactone  25 mg Oral Daily    Infusions:    PRN Medications: sodium chloride, acetaminophen, ondansetron (ZOFRAN) IV, oxyCODONE, oxyCODONE, sodium chloride, sodium chloride   Assessment:  1. A/C systolic heart failure EF 25%  2. CAD S/P CABG x3 07/23/12   LIMA to LAD, SVG to PDA, SVG to second of his marginal, SVG to first diagonal  3. Mitral regurgitation S/P MVR 07/23/12  4. Acute renal failure  5. Acute respiratory failure  6. Hyponatremia  7. Thrombocytopenia 8. Iron Deficient Anemia   Plan/Discussion:    He is clinically much improved but still tenuous with low BP, prominent s3 on exam and frequent multifocal PVCs. Volume status much improved. Will continue current regimen. Transfer to tele. Home tomorrow with LifeVest. Meds on d/c.   Bisoprolol 2.5 Losartan 25 Dig 0.125 Lasix 40 bid Cleda Daub 25 Metolazone 2.5 prn  F/u HF clinic on Wednesday.  Shaliyah Taite,MD 10:39 AM

## 2012-08-08 ENCOUNTER — Encounter (HOSPITAL_COMMUNITY): Payer: Self-pay | Admitting: Cardiology

## 2012-08-08 DIAGNOSIS — F17200 Nicotine dependence, unspecified, uncomplicated: Secondary | ICD-10-CM

## 2012-08-08 LAB — BASIC METABOLIC PANEL
BUN: 17 mg/dL (ref 6–23)
Calcium: 9 mg/dL (ref 8.4–10.5)
Creatinine, Ser: 0.83 mg/dL (ref 0.50–1.35)
GFR calc Af Amer: >90 mL/min (ref >90)
GFR calc non Af Amer: >90 mL/min (ref >90)
Glucose, Bld: 113 mg/dL — ABNORMAL HIGH (ref 70–99)
Potassium: 3.5 mEq/L (ref 3.5–5.1)

## 2012-08-08 MED ORDER — ASPIRIN EC 81 MG PO TBEC: 81.0000 mg | DELAYED_RELEASE_TABLET | Freq: Every day | ORAL | Status: DC

## 2012-08-08 MED ORDER — POTASSIUM CHLORIDE CRYS ER 20 MEQ PO TBCR: 20.0000 meq | EXTENDED_RELEASE_TABLET | Freq: Every day | ORAL | Status: DC

## 2012-08-08 MED ORDER — DOCUSATE SODIUM 250 MG PO CAPS: 250.0000 mg | ORAL_CAPSULE | Freq: Every day | ORAL | Status: AC | PRN

## 2012-08-08 MED ORDER — FUROSEMIDE 40 MG PO TABS: 40.0000 mg | ORAL_TABLET | Freq: Two times a day (BID) | ORAL | Status: DC

## 2012-08-08 MED ORDER — ATORVASTATIN CALCIUM 40 MG PO TABS: 40.0000 mg | ORAL_TABLET | Freq: Every day | ORAL | Status: DC

## 2012-08-08 MED ORDER — BISOPROLOL FUMARATE 5 MG PO TABS: 2.5000 mg | ORAL_TABLET | Freq: Every day | ORAL | Status: DC

## 2012-08-08 MED ORDER — FERROUS SULFATE 325 (65 FE) MG PO TABS: 325.0000 mg | ORAL_TABLET | Freq: Two times a day (BID) | ORAL | Status: DC

## 2012-08-08 MED ORDER — LOSARTAN POTASSIUM 25 MG PO TABS: 25.0000 mg | ORAL_TABLET | Freq: Every day | ORAL | Status: DC

## 2012-08-08 MED ORDER — METOLAZONE 2.5 MG PO TABS: 2.5000 mg | ORAL_TABLET | Freq: Every day | ORAL | Status: DC | PRN

## 2012-08-08 MED ORDER — SPIRONOLACTONE 25 MG PO TABS: 25.0000 mg | ORAL_TABLET | Freq: Every day | ORAL | Status: DC

## 2012-08-08 MED ORDER — DIGOXIN 125 MCG PO TABS: 0.1250 mg | ORAL_TABLET | Freq: Every day | ORAL | Status: DC

## 2012-08-08 NOTE — Progress Notes (Signed)
Miguel York Media planner for [employer] called message left to call back [phone] concerning life vest order for patient.

## 2012-08-08 NOTE — Progress Notes (Signed)
Tonye Becket NP with [employer] called message left to call back concerning life vest order for patient .

## 2012-08-08 NOTE — Progress Notes (Signed)
08/08/2012 1910 Spoke to Unit RN, Randa Lynn. Pt fitted for life vest. [facility] scheduled to follow up with pt. Message sent to [facility] weekday rep of pt's d/c home. Isidoro Donning RN CCM Case Mgmt phone [phone]

## 2012-08-08 NOTE — Discharge Summary (Signed)
Discharge Summary   Patient ID: Miguel York MRN: [id number], DOB/AGE: 1951-11-08 61 y.o.  Primary MD:  Primary Cardiologist: Dr. Verlin Grills. Bensimhon  Admit date: 08/03/2012 D/C date:     08/08/2012      Primary Discharge Diagnoses:  1. Acute on Chronic Systolic CHF  - Improved with IV diuresis & Milrinone therapy  - Echo 9/10 revealed EF 25%, diffuse hypokinesis, trivial MR, mild R/LAE, mod TR, PASP  - Discharge weight 158lbs (down from 174lbs on admit)  - Dc'd on Lasix 40mg  bid, Digoxin 0.125mg , Losartan 25mg , Bisoprolol 2.5mg , Aldactone 25mg , Kcl , Metolazone 2.5mg  prn weight >163lbs  - F/u HF clinic 9/18  2. Ischemic Cardiomyopathy EF 25%  - Polymorphic PVCs on telemetry  - LifeVest placed at discharge  3. Acute Renal Failure  - In the setting of #1; Crt 0.83 at dc  - F/u BMET  4. Acute Respiratory Failure  - In the setting of #1, Resolved  5. Hyponatremia  - In the setting of #1, Na 128 at dc  - F/u BMET  6. Iron Deficiency Anemia  - No signs of bleeding, H&H 8.9/25.9 at dc  - Initiated on Fe supplementation  - F/u PCP  7. Elevated LFTs  - Initiated on statin this admission  - F/u LFTs in 6-8wks  8. Hyperlipidemia  - LDL 90, goal <70  - Initiated on Lipitor   - F/u Lipid panel in 6-8wks  Secondary Discharge Diagnoses:  Past Medical History  Diagnosis Date  . COPD (chronic obstructive pulmonary disease)   . Coronary artery disease 07/15/2012    S/P 4v CABG 07/23/12  . 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 History of tobacco abuse     quit 06/2012  . History of ETOH abuse     quit 06/2012    Allergies No Known Allergies  Diagnostic Studies/Procedures:  08/03/12 - Echo Study Conclusions: - Left ventricle: Markedly abnormal septal motion The cavity  size was severely dilated. Wall thickness was increased in a pattern of mild LVH. The estimated ejection fraction was 25%. Diffuse hypokinesis. - Mitral valve: S/P mitral valve repair with trivial residual MR and normal diastolic gradients - Left atrium: The atrium was mildly dilated. - Right atrium: The atrium was mildly dilated. - Atrial septum: No defect or patent foramen ovale was identified. - Tricuspid valve: Moderate regurgitation. - Pulmonary arteries: PA peak pressure: 31mm Hg (S).   History of Present Illness: 61 y.o. male w/ the above medical problems who presented to [facility] on 08/03/12 with complaints of progressive dyspnea and weight gain. He was discharged from [facility] 07/28/12 after 4v CABG, MVR repair, and clipping of atrial appendage. He developed progressive dyspnea and a 20lb weight gain despite increase of outpatient oral diuretics for which he was instructed to return [facility] for hospital admission.  Hospital Course: At [facility] CXR showed small bilateral pleural effusions (L>R). Labs were significant for pBNP 1072, Na 122, BUN/Crt 42/1.41, AST/ALT 141/82. He was noted to be in low output heart failure with marked volume overload for which he was placed on IV lasix, Milrinone drip, digoxin, and admitted to stepdown for further evaluation and treatment. Carvedilol was held due to acute decompensation and  ACEI/ARB were not initiated due to renal insufficiency.  Echo revealed EF 25%, diffuse hypokinesis, trivial MR, mild R/LAE, mod TR, PASP . He responded well to IV lasix and milrinone therapy with improvement in symptoms and reduction in CVP. Lasix was transitioned to oral dosing and Milrinone stopped. He was initiated on Lisinopril & Aldactone and resumed on Coreg. Lisinopril was changed to Losartan and Coreg to Bisoprolol to decrease airway irritation. Frequent multifocal PVCs were noted on telemetry, but he remained asymptomatic with stable vital signs. It was  felt, given his low EF, he would benefit from placement of LifeVest for primary prevention of sudden cardiac death. He was fitted for and instructed on use of LifeVest on day of discharge.  Lipitor was added. He will need follow up lipid panel and LFTs in 6-8wks. Sodium levels improved. H&H was low with negative FOB and anemia panel showing reduced iron level for which he was started on Fe supplementation. He was able to ambulate without complaints of chest pain or sob. Home health RN was arranged by case management for heart failure management. He was seen and evaluated by Dr. Gala Romney who felt he was stable for discharge home after placement of LifeVest with plans for follow up as scheduled below.  Discharge Vitals: Blood pressure 104/60, pulse 58, temperature 98 F (36.7 C), temperature source Oral, resp. rate 18, height 5\' 11"  (1.803 m), weight 158 lb 3.2 oz (71.759 kg), SpO2 94.00%.  Labs: Component Value Date   WBC 5.9 08/03/2012   HGB 8.9* 08/03/2012   HCT 25.9* 08/03/2012   MCV 94.9 08/03/2012   PLT 230 08/03/2012     08/04/2012 11:59  Iron 19 (L)  UIBC 158  TIBC 177 (L)  Saturation Ratios 11 (L)  Ferritin 158  Folate 11.4   Lab 08/08/12 0630 08/02/12 1526  NA 128* --  K 3.5 --  CL 93* --  CO2 30 --  BUN 17 --  CREATININE 0.83 --  CALCIUM 9.0 --  PROT -- 8.0  BILITOT -- 0.8  ALKPHOS -- 99  ALT -- 82*  AST -- 141*  GLUCOSE 113* --     08/02/2012 15:26  Brain Natriuretic Peptide 1072.0 (H)     Discharge Medications     Medication List     As of 08/08/2012 11:57 AM    STOP taking these medications         carvedilol 12.5 MG tablet   Commonly known as: COREG      TAKE these medications         albuterol (2.5 MG/3ML) 0.083% nebulizer solution   Commonly known as: PROVENTIL   Take 2.5 mg by nebulization daily as needed. For shortness of breath or wheezing      albuterol 108 (90 BASE) MCG/ACT inhaler   Commonly known as: PROVENTIL HFA;VENTOLIN HFA   Inhale 2  puffs into the lungs as needed. For shortness of breath or wheezing      aspirin EC 81 MG tablet   Take 1 tablet (81 mg total) by mouth daily.      atorvastatin 40 MG tablet   Commonly known as: LIPITOR   Take 1 tablet (40 mg total) by mouth at bedtime.      bisoprolol 5 MG tablet   Commonly known as: ZEBETA   Take 0.5 tablets (2.5 mg total) by mouth daily.      digoxin 0.125 MG tablet   Commonly known as: LANOXIN   Take 1 tablet (0.125 mg total)  by mouth daily.      docusate sodium 250 MG capsule   Commonly known as: COLACE   Take 1 capsule (250 mg total) by mouth daily as needed for constipation.      ferrous sulfate 325 (65 FE) MG tablet   Take 1 tablet (325 mg total) by mouth 2 (two) times daily with a meal.      furosemide 40 MG tablet   Commonly known as: LASIX   Take 1 tablet (40 mg total) by mouth 2 (two) times daily.      losartan 25 MG tablet   Commonly known as: COZAAR   Take 1 tablet (25 mg total) by mouth daily.      metolazone 2.5 MG tablet   Commonly known as: ZAROXOLYN   Take 1 tablet (2.5 mg total) by mouth daily as needed (Take one tablet 30 minutes prior to lasix dose if weight greater than or equal to 163lbs).      oxycodone 5 MG capsule   Commonly known as: OXY-IR   Take 5-10 mg by mouth every 6 (six) hours as needed. For pain      potassium chloride SA 20 MEQ tablet   Commonly known as: K-DUR,KLOR-CON   Take 1 tablet (20 mEq total) by mouth daily.      spironolactone 25 MG tablet   Commonly known as: ALDACTONE   Take 1 tablet (25 mg total) by mouth daily.         Disposition   Discharge Orders    Future Appointments: Provider: Department: Dept Phone: Center:   08/11/2012 2:00 PM [facility] [phone] None   08/16/2012 2:30 PM Purcell Nails, MD [facility] [phone] [facility]     Future Orders Please Complete By Expires   Diet - low sodium heart healthy      Increase activity slowly      Discharge  instructions      Comments:   * Please wear your LifeVest as instructed.  * Please weigh daily. Take Metolazone (Zaroxyln) 2.5mg  30 minutes before your lasix dose if weight is 163lbs or higher  * Your iron levels were low for which you were started on iron supplementation. This may cause constipation, especially in combination with narcotic pain medication so you should take a stool softener (Colace, etc) as needed for constipation.   * You were started on a cholesterol medication (Lipitor) this hospitalization and will need follow up blood work (lipid panel and liver function tests) in 6-8wks with your primary care provider.     Follow-up Information    Follow up with Arvilla Meres, MD. On 08/11/2012. (at 2:00 garage code [id number])    Contact information:   [address] [place] [address] [phone]          . Outstanding Labs/Studies:  1. BMET 2. Patient will require follow up lipid panel and liver function tests in 6-8 weeks as we initiated a new statin during this hospitalization.   Duration of Discharge Encounter: Greater than 30 minutes including physician and PA time.  Signed, Siani Utke PA-C 08/08/2012, 11:57 AM

## 2012-08-08 NOTE — Progress Notes (Signed)
Advanced Heart Failure Rounding Note   Subjective:   Mr. Boyett is a 61 year old with a history of chronic systolic heart failure EF 15 %, ICM, CAD S/P CABG x 3 07/23/12 (LIMA to LAD, SVG to PDA, SVG to second of his marginal, SVG to first diagonal, and additionally had clipping of the left atrial appendage), Quit smoking 1 month ago, COPD, Moderate MR/TR S/P MVR 07/23/12, and quit drinking 1 month ago (18-24 beers per day)  Mr Bring was discharged from [facility] 07/28/12 after CABG x 3, MVR repair, and clipping of atrial appendage. His discharge weight was 167 pounds. Over the weekend he developed progressive dyspnea. He contacted Dr Langston Masker office and he was instrcuted to Increase lasix to 80 mg twice a day. He was evaluated by Dr Daleen Squibb 08/02/12 and had a 20 pound weight gain. Pertinent labs from 08/02/12 included creatinine increased to 1.4 from baseline 0.7 and sodium reduced to 122. Despite increased diuretics in the community he continued to complain of dyspnea with exertion. 08/03/12 Direct admit to 2900. Admit weight 174 pounds.  Received Milrinone for 24 hours. Milrinone stopped 08/04/12.  08/03/12 ECHO EF 25% diffuse hypokinesis.  Continues to improve. Ambulating hall without problem. Weight down to 158 (174 on admit). Continues with frequent polymorphic PVCs. LifeVest ordered.   Denies SOB/PND/Orthopnea/dizziness   Objective:   Weight Range:  Vital Signs:   Temp:  [97.7 F (36.5 C)-99.4 F (37.4 C)] 98 F (36.7 C) (09/15 0515) Pulse Rate:  [36-99] 58  (09/15 0515) Resp:  [16-19] 18  (09/15 0515) BP: (96-118)/(48-66) 104/60 mmHg (09/15 0515) SpO2:  [93 %-98 %] 94 % (09/15 0515) Weight:  [71.759 kg (158 lb 3.2 oz)-72.666 kg (160 lb 3.2 oz)] 71.759 kg (158 lb 3.2 oz) (09/15 0515) Last BM Date: 08/05/12  Weight change: Filed Weights   08/07/12 0700 08/07/12 1250 08/08/12 0515  Weight: 72.1 kg (158 lb 15.2 oz) 72.666 kg (160 lb 3.2 oz) 71.759 kg (158 lb 3.2 oz)     Intake/Output:   Intake/Output Summary (Last 24 hours) at 08/08/12 1004 Last data filed at 08/08/12 0940  Gross per 24 hour  Intake   1413 ml  Output    200 ml  Net   1213 ml     Physical Exam: CVP 6 (checked personally)  General:  Much improved.  No resp difficulty wife at bedside HEENT: normal Neck: supple. JVP 6 . Carotids 2+ bilat; no bruits. No lymphadenopathy or thryomegaly appreciated. Cor: PMI nondisplaced. Regular rate & rhythm. No rubs, gallops or murmurs. Lungs: IW throughout Abdomen: soft, nontender, nondistended. No hepatosplenomegaly. No bruits or masses. Good bowel sounds. Extremities: no cyanosis, clubbing, rash, edema Ted hose on bilaterally  PICC in RUE Neuro: alert & orientedx3, cranial nerves grossly intact. moves all 4 extremities w/o difficulty. Affect pleasant  Telemetry: SR with PVCs  Labs: Basic Metabolic Panel:  Lab 08/08/12 4098 08/07/12 0500 08/06/12 0355 08/05/12 0800 08/05/12 0315 08/04/12 0420  NA 128* 130* 130* -- 130* 129*  K 3.5 3.7 3.5 -- 3.5 3.6  CL 93* 91* 91* -- 93* 93*  CO2 30 31 33* -- 31 29  GLUCOSE 113* 108* 111* -- 104* 110*  BUN 17 17 16  -- 19 28*  CREATININE 0.83 0.82 0.90 -- 0.87 1.05  CALCIUM 9.0 9.0 8.9 -- -- --  MG -- -- -- 2.0 -- --  PHOS -- -- -- -- -- --    Liver Function Tests:  Lab 08/02/12 1526  AST 141*  ALT 82*  ALKPHOS 99  BILITOT 0.8  PROT 8.0  ALBUMIN 2.9*   No results found for this basename: LIPASE:5,AMYLASE:5 in the last 168 hours No results found for this basename: AMMONIA:3 in the last 168 hours  CBC:  Lab 08/03/12 1327 08/02/12 1526  WBC 5.9 2.5*  NEUTROABS -- 1.4*  HGB 8.9* 17.8*  HCT 25.9* 51.7  MCV 94.9 93.8  PLT 230 86*    Cardiac Enzymes: No results found for this basename: CKTOTAL:5,CKMB:5,CKMBINDEX:5,TROPONINI:5 in the last 168 hours  BNP: BNP (last 3 results)  Basename 08/03/12 1327 07/03/12 2330 05/11/12 1451  PROBNP 1072.0* 1670.0* 1842.0*     Other results    Imaging: No results found.   Medications:     Scheduled Medications:    . aspirin EC  81 mg Oral Daily  . atorvastatin  40 mg Oral q1800  . bisoprolol  2.5 mg Oral Daily  . digoxin  0.125 mg Oral Daily  . enoxaparin (LOVENOX) injection  40 mg Subcutaneous QHS  . ferrous sulfate  325 mg Oral BID WC  . furosemide  40 mg Oral BID  . losartan  25 mg Oral Daily  . sodium chloride  10-40 mL Intracatheter Q12H  . sodium chloride  3 mL Intravenous Q12H  . spironolactone  25 mg Oral Daily    Infusions:    PRN Medications: sodium chloride, acetaminophen, ondansetron (ZOFRAN) IV, oxyCODONE, oxyCODONE, sodium chloride, sodium chloride   Assessment:  1. A/C systolic heart failure EF 25%  2. CAD S/P CABG x3 07/23/12   LIMA to LAD, SVG to PDA, SVG to second of his marginal, SVG to first diagonal  3. Mitral regurgitation S/P MVR 07/23/12  4. Acute renal failure  5. Acute respiratory failure  6. Hyponatremia  7. Thrombocytopenia 8. Iron Deficient Anemia   Plan/Discussion:    He is clinically much improved but still tenuous with low BP, prominent s3 on exam and frequent multifocal PVCs. Volume status much improved. Will d/c home today. LifeVest to be fitted prior to d/c.   Bisoprolol 2.5 Losartan 25 Dig 0.125 Lasix 40 bid Spiro 25 Kcl 20 daily Metolazone 2.5 prn for weight 163 or greater.  F/u HF clinic on Wednesday.  Daniel Bensimhon,MD 10:04 AM

## 2012-08-10 ENCOUNTER — Telehealth (HOSPITAL_COMMUNITY): Payer: Self-pay | Admitting: Cardiology

## 2012-08-10 MED ORDER — SPIRONOLACTONE 25 MG PO TABS: 25.0000 mg | ORAL_TABLET | Freq: Every day | ORAL | Status: DC

## 2012-08-10 NOTE — Telephone Encounter (Signed)
Pts. Caregiver threw out 90-day supply of spironolactone after it was d/c'd sometime ago. Spironolactone was restarted 2-3 day ago. Pharmacy unable to fill rx again as insurance not willing to pay for meds since it is too early.  Pt. Attempted to pay for meds but two pills are $11 and the 90day supply is $ 15.  Advised pt that we could call in a $4 Rx to [facility] and give 2 refills as the 90 supply is not due until November

## 2012-08-11 ENCOUNTER — Encounter (HOSPITAL_COMMUNITY): Payer: Self-pay

## 2012-08-11 ENCOUNTER — Ambulatory Visit (HOSPITAL_COMMUNITY)
Admit: 2012-08-11 | Discharge: 2012-08-11 | Disposition: A | Discharge status: Home / Self Care | Payer: BC Managed Care – PPO | Attending: Internal Medicine | Admitting: Internal Medicine

## 2012-08-11 VITALS — BP 112/52 | HR 64 | Ht 72.0 in | Wt 161.8 lb

## 2012-08-11 DIAGNOSIS — F17200 Nicotine dependence, unspecified, uncomplicated: Secondary | ICD-10-CM | POA: Insufficient documentation

## 2012-08-11 DIAGNOSIS — I2510 Atherosclerotic heart disease of native coronary artery without angina pectoris: Secondary | ICD-10-CM | POA: Insufficient documentation

## 2012-08-11 DIAGNOSIS — I5022 Chronic systolic (congestive) heart failure: Secondary | ICD-10-CM | POA: Insufficient documentation

## 2012-08-11 DIAGNOSIS — Z720 Tobacco use: Secondary | ICD-10-CM

## 2012-08-11 LAB — BASIC METABOLIC PANEL
BUN: 16 mg/dL (ref 6–23)
Creatinine, Ser: 0.67 mg/dL (ref 0.50–1.35)
GFR calc Af Amer: >90 mL/min (ref >90)
GFR calc non Af Amer: >90 mL/min (ref >90)
Glucose, Bld: 121 mg/dL — ABNORMAL HIGH (ref 70–99)

## 2012-08-11 NOTE — Assessment & Plan Note (Signed)
Doing well post hospitalization. NYHA II. Reviewed d/c summary. Volume status stable. After medications reviewed his wife was confused and continued to give him lisinopril. Reinforced current medications and to stop lisinopril. Will not titrate medication today due to medication confusion. Check BMET today. Continue lifevest until repeat ECHO completed in 3 months. If EF remains  < 35% will refer to EP. Reinforced daily weights, low salt food choices, and limiting fluid intake to less than 2 liters per day.Refer to to cardiac rehab.  Follow up in 2 weeks.

## 2012-08-11 NOTE — Assessment & Plan Note (Signed)
Congratulated him on tobacco cessation.

## 2012-08-11 NOTE — Patient Instructions (Addendum)
STOP lisinopril  Follow up in 2 weeks  Do the following things EVERYDAY: 1) Weigh yourself in the morning before breakfast. Write it down and keep it in a log. 2) Take your medicines as prescribed 3) Eat low salt foods-Limit salt (sodium) to 2000 mg per day.  4) Stay as active as you can everyday 5) Limit all fluids for the day to less than 2 liters   Please call [phone] if your weight is less than 155 pounds or greater than 163 pounds

## 2012-08-11 NOTE — Assessment & Plan Note (Signed)
No evidence of ischemia. Continue current regimen.   

## 2012-08-11 NOTE — Progress Notes (Signed)
Patient ID: Miguel York, male   DOB: 08/14/1951, 61 y.o.   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 His discharge weight was 167 pounds. Over the weekend he developed progressive dyspnea. He contacted Dr Langston Masker office and he was instrcuted to Increase lasix to 80 mg twice a day. He was evaluated by Dr Daleen Squibb 08/02/12 and had a 20 pound weight gain. Pertinent labs from 08/02/12 included creatinine increased to 1.4 from baseline 0.7 and sodium reduced to 122. Despite increased diuretics in the community he continued to complain of dyspnea with exertion. 08/03/12 Direct admit to 2900. Admit weight 174 pounds. Received Milrinone for 24 hours. Milrinone stopped 08/04/12. 08/03/12 ECHO EF 25% diffuse hypokinesis. HF medication initiated. D/C with lifevest. Discharge weight 158 pounds. Discharge Medications Bisoprolol 2.5  Losartan 25  Dig 0.125  Lasix 40 bid  Spiro 25  Kcl 20 daily  Metolazone 2.5 prn for weight 163 or greater.    He returns for post hospital follow up. Feels good. Denies SOB/PND/Orthopnea/CP. Soreness from sternal incision. Weight at home 159-162 pounds. Confused about medications. He was taking lisinopril and losartan. He has been gardening over the last few days. Quick smoking over the last month. Remains alcohol free. Wearing life vest. Following low salt diet.     ROS: All systems negative  except as listed in HPI, PMH and Problem List.  Past Medical History  Diagnosis Date  . COPD (chronic obstructive pulmonary disease)   . Systolic CHF 07/15/2012    Echo 08/03/12 EF 25%, diffuse hypokinesis, trivial MR, mild R/LAE, mod TR, PASP  . Coronary artery disease 07/15/2012    S/P 4v CABG 07/23/12  . 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 Ischemic cardiomyopathy     EF 25%, LifeVest placed 08/08/12  . History of tobacco abuse     quit 06/2012  . History of ETOH abuse     quit 06/2012  . Hyponatremia 07/2012  . Iron deficiency anemia 07/2012  . Elevated LFTs 07/2012  . HLD (hyperlipidemia)     Current Outpatient Prescriptions  Medication Sig Dispense Refill  . albuterol (PROVENTIL HFA;VENTOLIN HFA) 108 (90 BASE) MCG/ACT inhaler Inhale 2 puffs into the lungs as needed. For shortness of breath or wheezing      . albuterol (PROVENTIL) (2.5 MG/3ML) 0.083% nebulizer solution Take 2.5 mg by nebulization daily as needed. For shortness of breath or wheezing      . aspirin EC 81 MG tablet Take 1 tablet (81 mg total) by mouth daily.      Marland Kitchen atorvastatin (LIPITOR) 40 MG tablet Take 1  tablet (40 mg total) by mouth at bedtime.  30 tablet  6  . bisoprolol (ZEBETA) 5 MG tablet Take 0.5 tablets (2.5 mg total) by mouth daily.  30 tablet  6  . digoxin (LANOXIN) 0.125 MG tablet Take 1 tablet (0.125 mg total) by mouth daily.  30 tablet  6  . docusate sodium (COLACE) 250 MG capsule Take 1 capsule (250 mg total) by mouth daily as needed for constipation.  30 capsule  0  . ferrous sulfate 325 (65 FE) MG tablet Take 1 tablet (325 mg total) by mouth 2 (two) times daily with a meal.  60 tablet  3  . furosemide (LASIX) 40 MG tablet Take 1 tablet (40 mg total) by mouth 2 (two) times daily.  60 tablet  6  . losartan (COZAAR) 25  MG tablet Take 1 tablet (25 mg total) by mouth daily.  30 tablet  6  . metolazone (ZAROXOLYN) 2.5 MG tablet Take 1 tablet (2.5 mg total) by mouth daily as needed (Take one tablet 30 minutes prior to lasix dose if weight greater than or equal to 163lbs).  30 tablet  6  . oxycodone (OXY-IR) 5 MG capsule Take 5-10 mg by mouth every 6 (six) hours as needed. For pain      . potassium chloride SA (K-DUR,KLOR-CON) 20 MEQ tablet Take 1 tablet (20 mEq total) by mouth daily.  30 tablet  6  . spironolactone (ALDACTONE) 25 MG tablet Take 1 tablet (25 mg total) by mouth daily.  30 tablet  3     PHYSICAL EXAM: Filed Vitals:   08/11/12 1421  BP: 112/52  Pulse: 64  Height: 6' (1.829 m)  Weight: 161 lb 12.8 oz (73.392 kg)  SpO2: 99%   161 (discharge weight 158 pounds)  General:  Chronically ill  appearing. No resp difficulty Wife present HEENT: normal Neck: supple. JVP 5-6. Carotids 2+ bilaterally; no bruits. No lymphadenopathy or thryomegaly appreciated. Cor: PMI normal. Regular rate & rhythm. No rubs, gallops or murmurs. Lungs: clear Abdomen: soft, nontender, nondistended. No hepatosplenomegaly. No bruits or masses. Good bowel sounds. Extremities: no cyanosis, clubbing, rash, trace R and LLE edema bilateral ted hose Neuro: alert & orientedx3, cranial nerves grossly intact. Moves all 4 extremities w/o difficulty. Affect pleasant.      ASSESSMENT & PLAN:

## 2012-08-12 ENCOUNTER — Other Ambulatory Visit: Payer: Self-pay | Admitting: Thoracic Surgery (Cardiothoracic Vascular Surgery)

## 2012-08-12 DIAGNOSIS — I2510 Atherosclerotic heart disease of native coronary artery without angina pectoris: Secondary | ICD-10-CM

## 2012-08-16 ENCOUNTER — Ambulatory Visit (INDEPENDENT_AMBULATORY_CARE_PROVIDER_SITE_OTHER): Payer: Self-pay | Admitting: Thoracic Surgery (Cardiothoracic Vascular Surgery)

## 2012-08-16 ENCOUNTER — Encounter: Payer: Self-pay | Admitting: Thoracic Surgery (Cardiothoracic Vascular Surgery)

## 2012-08-16 ENCOUNTER — Ambulatory Visit
Admission: RE | Admit: 2012-08-16 | Discharge: 2012-08-16 | Disposition: A | Discharge status: Home / Self Care | Payer: BC Managed Care – PPO | Source: Ambulatory Visit | Attending: Thoracic Surgery (Cardiothoracic Vascular Surgery) | Admitting: Thoracic Surgery (Cardiothoracic Vascular Surgery)

## 2012-08-16 VITALS — BP 104/46 | HR 50 | Resp 18 | Ht 71.0 in | Wt 154.0 lb

## 2012-08-16 DIAGNOSIS — Z951 Presence of aortocoronary bypass graft: Secondary | ICD-10-CM

## 2012-08-16 DIAGNOSIS — I2510 Atherosclerotic heart disease of native coronary artery without angina pectoris: Secondary | ICD-10-CM

## 2012-08-16 DIAGNOSIS — Z9889 Other specified postprocedural states: Secondary | ICD-10-CM

## 2012-08-16 NOTE — Progress Notes (Signed)
[address]            [place] [address]          [phone]     CARDIOTHORACIC SURGERY OFFICE NOTE  Referring Provider is Verne Carrow D*MD PCP is No primary provider on file.   HPI:  Patient returns for followup status post mitral valve repair and coronary artery bypass grafting x4 on 07/23/2012 for severe three-vessel coronary artery disease with moderate mitral regurgitation and severe dilated nonischemic cardiomyopathy.  Patient's early postoperative recovery was uncomplicated, but following hospital discharge he was readmitted for fluid overload and congestive heart failure. He did well with inpatient aggressive diuresis on milrinone therapy and was ultimately discharged again on September 15. He continues to be followed carefully through the heart failure clinic. Since most recent hospital discharge she has done well. He has actually been outside tending to his garden and he reports that is breathing is much better than it was prior to surgery. He still has some soreness in his chest but this has improved. He's not having any tachypalpitations no dizzy spells. His appetite is good. He is not smoking. He is not drinking alcohol.  His weight has continued to gradually declined since most recent hospital discharge.   Current Outpatient Prescriptions  Medication Sig Dispense Refill  . albuterol (PROVENTIL HFA;VENTOLIN HFA) 108 (90 BASE) MCG/ACT inhaler Inhale 2 puffs into the lungs as needed. For shortness of breath or wheezing      . albuterol (PROVENTIL) (2.5 MG/3ML) 0.083% nebulizer solution Take 2.5 mg by nebulization daily as needed. For shortness of breath or wheezing      . aspirin EC 81 MG tablet Take 1 tablet (81 mg total) by mouth daily.      Marland Kitchen atorvastatin (LIPITOR) 40 MG tablet Take 1 tablet (40 mg total) by mouth at bedtime.  30 tablet  6  . bisoprolol (ZEBETA) 5 MG tablet Take 0.5 tablets (2.5 mg total) by mouth daily.  30 tablet   6  . digoxin (LANOXIN) 0.125 MG tablet Take 1 tablet (0.125 mg total) by mouth daily.  30 tablet  6  . docusate sodium (COLACE) 250 MG capsule Take 1 capsule (250 mg total) by mouth daily as needed for constipation.  30 capsule  0  . ferrous sulfate 325 (65 FE) MG tablet Take 1 tablet (325 mg total) by mouth 2 (two) times daily with a meal.  60 tablet  3  . furosemide (LASIX) 40 MG tablet Take 1 tablet (40 mg total) by mouth 2 (two) times daily.  60 tablet  6  . losartan (COZAAR) 25 MG tablet Take 1 tablet (25 mg total) by mouth daily.  30 tablet  6  . metolazone (ZAROXOLYN) 2.5 MG tablet Take 1 tablet (2.5 mg total) by mouth daily as needed (Take one tablet 30 minutes prior to lasix dose if weight greater than or equal to 163lbs).  30 tablet  6  . oxycodone (OXY-IR) 5 MG capsule Take 5-10 mg by mouth every 6 (six) hours as needed. For pain      . potassium chloride SA (K-DUR,KLOR-CON) 20 MEQ tablet Take 1 tablet (20 mEq total) by mouth daily.  30 tablet  6  . spironolactone (ALDACTONE) 25 MG tablet Take 1 tablet (25 mg total) by mouth daily.  30 tablet  3      Physical Exam:  BP 104/46  Pulse 50  Resp 18  Ht 5\' 11"  (1.803 m)  Wt 154 lb (69.854 kg)  BMI 21.48 kg/m2  SpO2 98%  General:  Well-appearing  Chest:   Clear to auscultation with symmetrical breath sounds  CV:   Regular rate and rhythm without murmur  Incisions:  Clean and dry and healing nicely  Abdomen:  Soft and nontender  Extremities:  Warm and well-perfused with no lower extremity edema  Diagnostic Tests:  *RADIOLOGY REPORT*  Clinical Data: Recent CABG procedure with mild soreness in the mid  chest.  CHEST - 2 VIEW  Comparison: 08/02/2012  Findings: Stable postsurgical changes in the chest, including a  median sternotomy wires, atrial clip and mitral valve annuloplasty.  Heart size is stable. Persistent density along the left lower  paraspinal region could represent loculated pleural fluid versus a  hiatal hernia.  There appears to be decreased pleural fluid along  the left lateral lung base. No evidence for a pneumothorax or  pulmonary edema.  IMPRESSION:  Decreasing left pleural fluid.  No acute chest findings.  Original Report Authenticated By: Richarda Overlie, M.D.    Impression:  Patient is doing well following recent coronary artery bypass grafting and mitral valve repair. His weight has continued to gradually decrease an he appears essentially euvolemic on exam today.  Plan:  I've encouraged patient to continue to gradually increase his physical activity as tolerated with his primary limitation remaining that he ring from heavy lifting or strenuous use of his arms or shoulders for least another 6-8 weeks. From a surgical standpoint he could resume driving an automobile, but I think this decision should be deferred to the heart failure team particularly since he is now wearing a life vest portable defibrillator because of his underlying severe dilated cardiomyopathy and theoretical risk of arrhythmia.  As a result I recommend that he not drive an automobile until cleared by his cardiologist. I have given him a refill prescription for oxycodone today. We'll plan to see him back in 3 months. At some point he will need a followup echocardiogram performed.   Salvatore Decent. Cornelius Moras, MD 08/16/2012 3:03 PM

## 2012-08-16 NOTE — Patient Instructions (Signed)
The patient has been reminded to continue to avoid any heavy lifting or strenuous use of arms or shoulders for at least a total of three months from the time of surgery.  

## 2012-08-22 NOTE — Progress Notes (Signed)
Agree 

## 2012-08-23 NOTE — Discharge Summary (Signed)
Patient seen and examined. See my rounding note for full details. He is much improved after diuresis and HF tune-up however remains somewhat tenuous. Reinforced need for daily weights and reviewed use of sliding scale diuretics. Will continue to follow closely in HF clinic. OK for d/c today.  Truman Hayward 6:31 PM

## 2012-08-25 ENCOUNTER — Ambulatory Visit (HOSPITAL_COMMUNITY)
Admission: RE | Admit: 2012-08-25 | Discharge: 2012-08-25 | Disposition: A | Discharge status: Home / Self Care | Payer: BC Managed Care – PPO | Source: Ambulatory Visit | Attending: Internal Medicine | Admitting: Internal Medicine

## 2012-08-25 VITALS — BP 106/48 | Wt 159.0 lb

## 2012-08-25 DIAGNOSIS — I2510 Atherosclerotic heart disease of native coronary artery without angina pectoris: Secondary | ICD-10-CM | POA: Insufficient documentation

## 2012-08-25 DIAGNOSIS — I5022 Chronic systolic (congestive) heart failure: Secondary | ICD-10-CM | POA: Insufficient documentation

## 2012-08-25 LAB — BASIC METABOLIC PANEL
BUN: 17 mg/dL (ref 6–23)
Calcium: 9.5 mg/dL (ref 8.4–10.5)
Creatinine, Ser: 1.07 mg/dL (ref 0.50–1.35)
GFR calc Af Amer: 86 mL/min — ABNORMAL LOW (ref >90)

## 2012-08-25 MED ORDER — LOSARTAN POTASSIUM 50 MG PO TABS: 50.0000 mg | ORAL_TABLET | Freq: Every day | ORAL | Status: DC

## 2012-08-25 NOTE — Assessment & Plan Note (Addendum)
NYHA II. Volume status stable. Agree with Lasix 40 mg daily instead of lasix 40 mg bid.  Stop potassium.  Increase losartan to 50 mg daily. Repeat BMET today and next Friday at his PCP. Will need to repeat  ECHO in early December. If EF remains < 35% will need to refer for ICD. Continue life-vest. Reinforced daily weights, medication compliance, and fluid restriction.  Follow up in 3 weeks with Dr Gala Romney.

## 2012-08-25 NOTE — Patient Instructions (Addendum)
Take losartan 50 mg daily  Stop Potassium   Check BMET next week at Dr Vista Deck office and fax results.   Follow up in 3 weeks with Dr Leory Plowman

## 2012-08-25 NOTE — Progress Notes (Signed)
Patient ID: Miguel York, male   DOB: Nov 15, 1951, 61 y.o.   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 His discharge weight was 167 pounds.  Over the weekend he developed progressive dyspnea. He contacted Dr Langston Masker office and he was instrcuted to Increase lasix to 80 mg twice a day. He was evaluated by Dr Daleen Squibb 08/02/12 and had a 20 pound weight gain. Pertinent labs from 08/02/12 included creatinine increased to 1.4 from baseline 0.7 and sodium reduced to 122. Despite increased diuretics in the community he continued to complain of dyspnea with exertion. 08/03/12 Direct admit to 2900. Admit weight 174 pounds. Received Milrinone for 24 hours. Milrinone stopped 08/04/12. 08/03/12 ECHO EF 25% diffuse hypokinesis. HF medication initiated. D/C with lifevest. Discharge weight 158 pounds. Discharge Medications Bisoprolol 2.5  Losartan 25  Dig 0.125  Lasix 40 bid  Spiro 25  Kcl 20 daily  Metolazone 2.5 prn for weight 163 or greater.  08/11/12 Potassium  3.9   He returns for follow up. Last visit medications were not titrated because his wife was confused about discharge medications Denies SOB/PND/Orthopnea/. Weight at home dropped to 155 pounds and he cut lasix to 40 mg daily. Weight at home 155-158 pounds. Walking 3 miles per day without difficulty.  Wearing lifevest but doesn't like it. No shocks. Not smoking or  drinking alcohol. Compliant with medications.      ROS: All systems negative except as listed in HPI, PMH and Problem List.  Past Medical History  Diagnosis Date  . COPD (chronic obstructive pulmonary disease)   . Systolic CHF 07/15/2012    Echo 08/03/12 EF 25%, diffuse hypokinesis, trivial MR, mild R/LAE, mod TR, PASP  . Coronary artery disease 07/15/2012    S/P 4v CABG 07/23/12  . 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 Ischemic cardiomyopathy     EF 25%, LifeVest placed 08/08/12  . History of tobacco abuse     quit 06/2012  . History of ETOH abuse     quit 06/2012  . Hyponatremia 07/2012  . Iron deficiency anemia 07/2012  . Elevated LFTs 07/2012  . HLD (hyperlipidemia)     Current Outpatient Prescriptions  Medication Sig Dispense Refill  . albuterol (PROVENTIL HFA;VENTOLIN HFA) 108 (90 BASE) MCG/ACT inhaler Inhale 2 puffs into the lungs as needed. For shortness of breath or wheezing      . albuterol (PROVENTIL) (2.5 MG/3ML) 0.083% nebulizer solution Take 2.5 mg by nebulization daily as needed. For shortness of breath or wheezing      . aspirin EC 81 MG tablet Take 1 tablet (81  mg total) by mouth daily.      Marland Kitchen atorvastatin (LIPITOR) 40 MG tablet Take 1 tablet (40 mg total) by mouth at bedtime.  30 tablet  6  . bisoprolol (ZEBETA) 5 MG tablet Take 0.5 tablets (2.5 mg total) by mouth daily.  30 tablet  6  . digoxin (LANOXIN) 0.125 MG tablet Take 1 tablet (0.125 mg total) by mouth daily.  30 tablet  6  . ferrous sulfate 325 (65 FE) MG tablet Take 1 tablet (325 mg total) by mouth 2 (two) times daily with a meal.  60 tablet  3  . furosemide (LASIX) 40 MG tablet Take 1 tablet (40 mg total) by mouth 2 (two) times daily.  60 tablet  6  . losartan (COZAAR) 25 MG tablet Take 1 tablet (25 mg total) by mouth  daily.  30 tablet  6  . metolazone (ZAROXOLYN) 2.5 MG tablet Take 1 tablet (2.5 mg total) by mouth daily as needed (Take one tablet 30 minutes prior to lasix dose if weight greater than or equal to 163lbs).  30 tablet  6  . oxycodone (OXY-IR) 5 MG capsule Take 5-10 mg by mouth every 6 (six) hours as needed. For pain      . potassium chloride SA (K-DUR,KLOR-CON) 20 MEQ tablet Take 1 tablet (20 mEq total) by mouth daily.  30 tablet  6  . spironolactone (ALDACTONE) 25 MG tablet Take 1 tablet (25 mg total) by mouth daily.  30 tablet  3     PHYSICAL EXAM: Filed Vitals:   08/25/12 1354  BP: 106/48  Weight: 159 lb (72.122 kg)  SpO2: 98%   159 (161) (discharge weight 158 pounds)  General:  Chronically ill  appearing. No resp difficulty Wife present Life Vest on HEENT: normal Neck: supple. JVP 5-6. Carotids 2+ bilaterally; no bruits. No lymphadenopathy or thryomegaly appreciated. Cor: PMI normal. Irregular rate & rhythm. No rubs, gallops or murmurs. Lungs: clear Abdomen: soft, nontender, nondistended. No hepatosplenomegaly. No bruits or masses. Good bowel sounds. Extremities: no cyanosis, clubbing, rash, and no lower extremity edema Neuro: alert & orientedx3, cranial nerves grossly intact. Moves all 4 extremities w/o difficulty. Affect pleasant.  EKG: SR frequent PVCs 65    ASSESSMENT & PLAN:

## 2012-08-25 NOTE — Assessment & Plan Note (Signed)
No evidence of ischemia. Continue current regimen. Refer to cardiac rehab.  

## 2012-08-26 NOTE — Addendum Note (Signed)
Encounter addended by: Cresenciano Genre on: 08/26/2012  9:08 AM<BR>     Documentation filed: Charges VN

## 2012-09-01 ENCOUNTER — Telehealth: Payer: Self-pay

## 2012-09-01 NOTE — Telephone Encounter (Signed)
Called pt. He was recently hospitalized and had by pass surgery. He will check with cardiology and call back when he is able to schedule . Sending Dr. Felecia Shelling a letter.

## 2012-09-02 ENCOUNTER — Other Ambulatory Visit (HOSPITAL_COMMUNITY): Payer: Self-pay | Admitting: Adult Health

## 2012-09-02 MED ORDER — LOSARTAN POTASSIUM 50 MG PO TABS: 50.0000 mg | ORAL_TABLET | Freq: Every day | ORAL | Status: DC

## 2012-09-03 ENCOUNTER — Telehealth (HOSPITAL_COMMUNITY): Payer: Self-pay | Admitting: Adult Health

## 2012-09-03 NOTE — Telephone Encounter (Signed)
Refill for Losartan  called in to [facility]. Miguel York verbalized understanding .   Miguel York says that he feels great and walking 5 miles a day.   [name] 9:39 AM

## 2012-09-16 ENCOUNTER — Encounter (HOSPITAL_COMMUNITY): Payer: Self-pay

## 2012-09-16 ENCOUNTER — Encounter (HOSPITAL_COMMUNITY): Payer: Self-pay | Admitting: Internal Medicine

## 2012-09-16 ENCOUNTER — Ambulatory Visit (HOSPITAL_COMMUNITY)
Admission: RE | Admit: 2012-09-16 | Discharge: 2012-09-16 | Disposition: A | Discharge status: Home / Self Care | Payer: BC Managed Care – PPO | Source: Ambulatory Visit | Attending: Internal Medicine | Admitting: Internal Medicine

## 2012-09-16 VITALS — BP 128/58 | HR 36 | Wt 163.0 lb

## 2012-09-16 DIAGNOSIS — I5022 Chronic systolic (congestive) heart failure: Secondary | ICD-10-CM

## 2012-09-16 DIAGNOSIS — I509 Heart failure, unspecified: Secondary | ICD-10-CM | POA: Insufficient documentation

## 2012-09-16 LAB — BASIC METABOLIC PANEL
CO2: 29 mEq/L (ref 19–32)
Calcium: 9.5 mg/dL (ref 8.4–10.5)
Creatinine, Ser: 0.7 mg/dL (ref 0.50–1.35)
GFR calc non Af Amer: >90 mL/min (ref >90)
Glucose, Bld: 102 mg/dL — ABNORMAL HIGH (ref 70–99)
Sodium: 133 mEq/L — ABNORMAL LOW (ref 135–145)

## 2012-09-16 MED ORDER — LOSARTAN POTASSIUM 100 MG PO TABS: 100.0000 mg | ORAL_TABLET | Freq: Every day | ORAL | Status: DC

## 2012-09-16 NOTE — Progress Notes (Signed)
Patient ID: Miguel York, male   DOB: 1951-05-20, 61 y.o.   MRN: [id number] Weight Range  D/C Weight 158 lbs 9/15   Baseline proBNP  Admit Pro BNP 1072   PCP: None  Cardiologist: Dr [name]  Cardiac Surgeon: Dr Cornelius Moras   HPI: Miguel York is a 61 year old with a history of chronic systolic heart failure EF 15 %, ICM, CAD S/P CABG x 3 07/23/12 (LIMA to LAD, SVG to PDA, SVG to second of his marginal, SVG to first diagonal, and additionally had clipping of the left atrial appendage), Quit smoking 1 month ago, COPD, Moderate Miguel/TR S/P MVR 07/23/12, and quit drinking 1 month ago (18-24 beers per day)  Miguel York was discharged from [facility] 07/28/12 after CABG x 3, MVR repair, and clipping of atrial appendage. His discharge weight was 167 pounds. Over the weekend he developed progressive dyspnea. He contacted Dr Langston Masker office and he was instrcuted to Increase lasix to 80 mg twice a day. He was evaluated by Dr Daleen Squibb 08/02/12 and had a 20 pound weight gain. Pertinent labs from 08/02/12 included creatinine increased to 1.4 from baseline 0.7 and sodium reduced to 122. Despite increased diuretics in the community he continued to complain of dyspnea with exertion.   08/03/12 Direct admit to 2900. Admit weight 174 pounds. Received Milrinone for 24 hours. Milrinone stopped 08/04/12. 08/03/12 ECHO EF 25% diffuse hypokinesis. HF medication initiated. D/C with lifevest. Discharge weight 158 pounds. Discharge Medications Bisoprolol 2.5 Losartan 25 mg daily Dig 0.125 mg daily Lasix 40 bid Spiro 25 mg daily Kcl 20 daily Metolazone 2.5 prn for weight 163 or greater. 08/11/12 Potassium 3.9   He returns for follow up. Last visit lasix cut back to 40 mg daily and Losartan increased to 50 mg daily. He does not wear life vest. Wants return to work.  Denies SOB/PND/Orthopnea/CP. Walks 6 miles every day. Weight at home 163 pounds. Complaint with medications. Following low salt diet.     ROS: All systems negative except as listed in HPI, PMH  and Problem List.  Past Medical History  Diagnosis Date  . COPD (chronic obstructive pulmonary disease)   . Systolic CHF 07/15/2012    Echo 08/03/12 EF 25%, diffuse hypokinesis, trivial Miguel, mild R/LAE, mod TR, PASP  . Coronary artery disease 07/15/2012    S/P 4v CABG 07/23/12  . 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 Ischemic cardiomyopathy     EF 25%, LifeVest placed 08/08/12  . History of tobacco abuse     quit 06/2012  . History of ETOH abuse     quit 06/2012  . Hyponatremia 07/2012  . Iron deficiency anemia 07/2012  . Elevated LFTs 07/2012  . HLD (hyperlipidemia)     Current Outpatient Prescriptions  Medication Sig Dispense Refill  . aspirin EC 81 MG tablet Take 1 tablet (81 mg total) by mouth daily.      Marland Kitchen atorvastatin (LIPITOR) 40 MG tablet Take 1 tablet (40 mg total) by mouth at bedtime.  30 tablet  6  . bisoprolol (ZEBETA) 5 MG tablet Take 0.5 tablets (2.5 mg total) by mouth daily.  30 tablet  6  . digoxin (LANOXIN) 0.125 MG tablet Take 1 tablet (0.125 mg total) by mouth daily.  30 tablet  6  . furosemide (LASIX) 40 MG tablet Take 40 mg by mouth daily.      Marland Kitchen losartan (COZAAR) 50 MG tablet Take 1 tablet (50 mg total) by mouth daily.  30 tablet  6  . spironolactone (ALDACTONE) 25 MG tablet Take 1 tablet (25 mg total) by mouth daily.  30 tablet  3  . albuterol (PROVENTIL HFA;VENTOLIN HFA) 108 (90 BASE) MCG/ACT inhaler Inhale 2 puffs into the lungs as needed. For shortness of breath or wheezing      . albuterol (PROVENTIL) (2.5 MG/3ML) 0.083% nebulizer solution Take 2.5 mg by nebulization daily as needed. For shortness of breath or wheezing      . ferrous sulfate 325 (65 FE) MG tablet Take 1 tablet (325 mg total) by mouth 2 (two) times daily with a meal.  60 tablet  3  . metolazone (ZAROXOLYN) 2.5  MG tablet Take 1 tablet (2.5 mg total) by mouth daily as needed (Take one tablet 30 minutes prior to lasix dose if weight greater than or equal to 163lbs).  30 tablet  6     PHYSICAL EXAM: Filed Vitals:   09/16/12 1511  BP: 128/58  Pulse: 36  Weight: 163 lb (73.936 kg)  SpO2: 100%   General:  Well appearing. No resp difficulty HEENT: normal Neck: supple. JVP flat. Carotids 2+ bilaterally; no bruits. No lymphadenopathy or thryomegaly appreciated. Cor: PMI normal. Regular rate & rhythm. No rubs, gallops or murmurs. Lungs: clear Abdomen: soft, nontender, nondistended. No hepatosplenomegaly. No bruits or masses. Good bowel sounds. Extremities: no cyanosis, clubbing, rash, edema Neuro: alert & orientedx3, cranial nerves grossly intact. Moves all 4 extremities w/o difficulty. Affect pleasant.      ASSESSMENT & PLAN:

## 2012-09-16 NOTE — Assessment & Plan Note (Addendum)
Doing great. Able to walk 6 miles per day. Volume status stable. Continue current diuretic regimen. Would like to increase beta blocker but he has frequent PVCs/Bigimeny. Refuses to wear lifevest. Increase losartan to 100 mg daily. He is able to return to work as long as he can take rest breaks. Check BMET.Follow up in 1 month with and ECHO.  Patient seen and examined with Tonye Becket, NP. We discussed all aspects of the encounter. I agree with the assessment and plan as stated above. He is much improved and demanding to go back to work. Volume status look good. Will increase losartan. Ok to d/c LifeVest. Can return to work as long as he can have rest breaks as needed - which he assures me will not be a problem. Return in 1 month with echo. If EF remains <= 35% will need to discuss ICD. Check labs. Reinforced need for daily weights and reviewed use of sliding scale diuretics.

## 2012-09-16 NOTE — Patient Instructions (Addendum)
Take losartan 100 mg daily  Follow up in 1 month with an ECHO  Do the following things EVERYDAY: 1) Weigh yourself in the morning before breakfast. Write it down and keep it in a log. 2) Take your medicines as prescribed 3) Eat low salt foods-Limit salt (sodium) to 2000 mg per day.  4) Stay as active as you can everyday 5) Limit all fluids for the day to less than 2 liters

## 2012-09-20 ENCOUNTER — Other Ambulatory Visit: Payer: Self-pay | Admitting: Physician Assistant

## 2012-10-06 DIAGNOSIS — Z48812 Encounter for surgical aftercare following surgery on the circulatory system: Secondary | ICD-10-CM

## 2012-10-08 ENCOUNTER — Other Ambulatory Visit: Payer: Self-pay | Admitting: Physician Assistant

## 2012-10-11 ENCOUNTER — Other Ambulatory Visit: Payer: Self-pay | Admitting: Cardiology

## 2012-10-13 ENCOUNTER — Other Ambulatory Visit: Payer: Self-pay

## 2012-10-13 NOTE — Telephone Encounter (Signed)
Carvedilol was d/c'd at discharge on 9/15, spoke w/[facility] they state they received rx fro carvedilol today but pt hasn't picked up yet, he did p/u the bisoprolol on 11/15, gave order to d/c carvedilol and make sure pt does not receive

## 2012-10-13 NOTE — Telephone Encounter (Signed)
New Problem:    Called in needing a refill of the patient's carvedilol (COREG) 12.5 MG tablet [Pharmacy Med Name: CARVEDILOL 12.5 MG TABLET] .  Please call back.

## 2012-10-13 NOTE — Addendum Note (Signed)
Addended by: Noralee Space on: 10/13/2012 05:01 PM   Modules accepted: Orders

## 2012-10-18 ENCOUNTER — Ambulatory Visit (HOSPITAL_COMMUNITY)
Admission: RE | Admit: 2012-10-18 | Discharge: 2012-10-18 | Disposition: A | Discharge status: Home / Self Care | Payer: BC Managed Care – PPO | Source: Ambulatory Visit | Attending: Internal Medicine | Admitting: Internal Medicine

## 2012-10-18 ENCOUNTER — Ambulatory Visit (HOSPITAL_BASED_OUTPATIENT_CLINIC_OR_DEPARTMENT_OTHER)
Admission: RE | Admit: 2012-10-18 | Discharge: 2012-10-18 | Disposition: A | Discharge status: Home / Self Care | Payer: BC Managed Care – PPO | Source: Ambulatory Visit | Attending: Internal Medicine | Admitting: Internal Medicine

## 2012-10-18 ENCOUNTER — Encounter (HOSPITAL_COMMUNITY): Payer: Self-pay

## 2012-10-18 VITALS — BP 136/78 | HR 65 | Wt 167.0 lb

## 2012-10-18 DIAGNOSIS — I369 Nonrheumatic tricuspid valve disorder, unspecified: Secondary | ICD-10-CM

## 2012-10-18 DIAGNOSIS — I517 Cardiomegaly: Secondary | ICD-10-CM | POA: Insufficient documentation

## 2012-10-18 DIAGNOSIS — Z87891 Personal history of nicotine dependence: Secondary | ICD-10-CM | POA: Insufficient documentation

## 2012-10-18 DIAGNOSIS — I5022 Chronic systolic (congestive) heart failure: Secondary | ICD-10-CM

## 2012-10-18 DIAGNOSIS — I079 Rheumatic tricuspid valve disease, unspecified: Secondary | ICD-10-CM | POA: Insufficient documentation

## 2012-10-18 DIAGNOSIS — F17200 Nicotine dependence, unspecified, uncomplicated: Secondary | ICD-10-CM

## 2012-10-18 DIAGNOSIS — I509 Heart failure, unspecified: Secondary | ICD-10-CM | POA: Insufficient documentation

## 2012-10-18 DIAGNOSIS — Z720 Tobacco use: Secondary | ICD-10-CM

## 2012-10-18 LAB — BASIC METABOLIC PANEL
BUN: 18 mg/dL (ref 6–23)
Calcium: 10.1 mg/dL (ref 8.4–10.5)
GFR calc Af Amer: >90 mL/min (ref >90)
GFR calc non Af Amer: >90 mL/min (ref >90)
Glucose, Bld: 91 mg/dL (ref 70–99)
Potassium: 3.9 mEq/L (ref 3.5–5.1)
Sodium: 136 mEq/L (ref 135–145)

## 2012-10-18 NOTE — Progress Notes (Signed)
Echocardiogram 2D Echocardiogram limited has been performed.  Miguel York 10/18/2012, 3:33 PM

## 2012-10-18 NOTE — Patient Instructions (Addendum)
Stop digoxin  Follow up in 6 weeks  Do the following things EVERYDAY: 1) Weigh yourself in the morning before breakfast. Write it down and keep it in a log. 2) Take your medicines as prescribed 3) Eat low salt foods-Limit salt (sodium) to 2000 mg per day.  4) Stay as active as you can everyday 5) Limit all fluids for the day to less than 2 liters

## 2012-10-18 NOTE — Progress Notes (Signed)
Patient ID: ASAR EVILSIZER, male   DOB: Apr 02, 1951, 61 y.o.   MRN: [id number]  Weight Range  D/C Weight 158 lbs 9/15   Baseline proBNP  Admit Pro BNP 1072   PCP: None  Cardiologist: Dr [name]  Cardiac Surgeon: Dr Cornelius Moras   HPI: Mr. Buda is a 61 year old with a history of chronic systolic heart failure EF 15 %, ICM, CAD S/P CABG x 3 07/23/12 (LIMA to LAD, SVG to PDA, SVG to second of his marginal, SVG to first diagonal, and additionally had clipping of the left atrial appendage), Quit smoking 1 month ago, COPD, Moderate MR/TR S/P MVR 07/23/12, and quit drinking 1 month ago (18-24 beers per day)   Mr Delay was discharged from [facility] 07/28/12 after CABG x 3, MVR repair, and clipping of atrial appendage. His discharge weight was 167 pounds.   08/03/12 Direct admit to 2900. Admit weight 174 pounds. Received Milrinone for 24 hours. Milrinone stopped 08/04/12. 08/03/12 ECHO EF 25% diffuse hypokinesis. HF medication initiated. D/C with lifevest. Discharge weight 158 pounds. Discharge Medications Bisoprolol 2.5 Losartan 25 mg daily Dig 0.125 mg daily Lasix 40 bid Spiro 25 mg daily Kcl 20 daily Metolazone 2.5 prn for weight 163 or greater. 08/11/12 Potassium 3.9   ECHO 10/18/12 EF 40-45% RV function improved. (images reviewed personally in clinic)  He returns for follow up. Last visit losartan increased to 100 mg daily. Beta blocker has not been titrated due to frequent PVCs/bigeminy.  Feels great. Denies SOB/PND/Orthopnea. Weight at home 161 pounds. Denies lower extremity edema. Smoking a few cigarettes. Compliant with medications. Working full time.   ROS: All systems negative except as listed in HPI, PMH and Problem List.  Past Medical History  Diagnosis Date  . COPD (chronic obstructive pulmonary disease)   . Systolic CHF 07/15/2012    Echo 08/03/12 EF 25%, diffuse hypokinesis, trivial MR, mild R/LAE, mod TR, PASP  . Coronary artery disease 07/15/2012    S/P 4v CABG 07/23/12  . S/P CABG x 4  07/23/2012    LIMA to LAD, SVG to D1, SVG to OM2, SVG to PDA, EVH via right thigh and leg; Clipping of the left atrial appendage  . 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 Ischemic cardiomyopathy     EF 25%, LifeVest placed 08/08/12  . History of tobacco abuse     quit 06/2012  . History of ETOH abuse     quit 06/2012  . Hyponatremia 07/2012  . Iron deficiency anemia 07/2012  . Elevated LFTs 07/2012  . HLD (hyperlipidemia)     Current Outpatient Prescriptions  Medication Sig Dispense Refill  . aspirin EC 81 MG tablet Take 1 tablet (81 mg total) by mouth daily.      . bisoprolol (ZEBETA) 5 MG tablet Take 0.5 tablets (2.5 mg total) by mouth daily.  30 tablet  6  . digoxin (LANOXIN) 0.125 MG tablet Take 1 tablet (0.125 mg total) by mouth daily.  30 tablet  6  . ferrous sulfate 325 (65 FE) MG tablet Take 1 tablet (325 mg total) by mouth 2 (two) times daily with a meal.  60 tablet  3  . furosemide (LASIX) 40 MG tablet Take 40 mg by mouth daily.      Marland Kitchen losartan (COZAAR) 100 MG tablet Take 1 tablet (100 mg total) by mouth daily.  30 tablet  6  . metolazone (ZAROXOLYN) 2.5 MG tablet Take 1 tablet (2.5  mg total) by mouth daily as needed (Take one tablet 30 minutes prior to lasix dose if weight greater than or equal to 163lbs).  30 tablet  6  . spironolactone (ALDACTONE) 25 MG tablet Take 1 tablet (25 mg total) by mouth daily.  30 tablet  3  . albuterol (PROVENTIL HFA;VENTOLIN HFA) 108 (90 BASE) MCG/ACT inhaler Inhale 2 puffs into the lungs as needed. For shortness of breath or wheezing      . albuterol (PROVENTIL) (2.5 MG/3ML) 0.083% nebulizer solution Take 2.5 mg by nebulization daily as needed. For shortness of breath or wheezing      . atorvastatin (LIPITOR) 40 MG tablet Take 1 tablet (40 mg total) by mouth at bedtime.  30 tablet  6     PHYSICAL EXAM: Filed Vitals:   10/18/12 1534  BP: 136/78  Pulse: 65  Weight: 167 lb (75.751 kg)  SpO2: 99%   General:   Well appearing. No resp difficulty ( wife present)HEENT: normal Neck: supple. JVP flat. Carotids 2+ bilaterally; no bruits. No lymphadenopathy or thryomegaly appreciated. Cor: PMI normal. Regular rate & rhythm. No rubs, gallops or murmurs. Lungs: clear Abdomen: soft, nontender, nondistended. No hepatosplenomegaly. No bruits or masses. Good bowel sounds. Extremities: no cyanosis, clubbing, rash, edema Neuro: alert & orientedx3, cranial nerves grossly intact. Moves all 4 extremities w/o difficulty. Affect pleasant.   EKG: SR frequent PVCs 68 bpm   ASSESSMENT & PLAN:

## 2012-10-18 NOTE — Assessment & Plan Note (Addendum)
NYHA II. Volume status stable. Dr Gala Romney reviewed and discussed ECHO result. EF improved to 40-45%. Stop digoxin.  Check EKG today.  Check BMET. Follow up in 6 weeks.   Patient seen and examined with Tonye Becket, NP. We discussed all aspects of the encounter. I agree with the assessment and plan as stated above. I reviewed echo today personally and EF much improved. Now 40-45%. He is much more active. Volume status looks good. At next visit need to consider increasing bisoprolol to 5 daily.

## 2012-10-23 NOTE — Assessment & Plan Note (Signed)
Counseled on need to stop smoking completely. 

## 2012-11-05 ENCOUNTER — Other Ambulatory Visit: Payer: Self-pay | Admitting: Physician Assistant

## 2012-11-10 ENCOUNTER — Other Ambulatory Visit: Payer: Self-pay | Admitting: Cardiology

## 2012-11-15 ENCOUNTER — Encounter: Payer: Self-pay | Admitting: Thoracic Surgery (Cardiothoracic Vascular Surgery)

## 2012-11-15 ENCOUNTER — Ambulatory Visit (INDEPENDENT_AMBULATORY_CARE_PROVIDER_SITE_OTHER): Payer: BC Managed Care – PPO | Admitting: Thoracic Surgery (Cardiothoracic Vascular Surgery)

## 2012-11-15 VITALS — BP 122/67 | HR 67 | Resp 20 | Ht 71.0 in | Wt 167.0 lb

## 2012-11-15 DIAGNOSIS — I340 Nonrheumatic mitral (valve) insufficiency: Secondary | ICD-10-CM

## 2012-11-15 DIAGNOSIS — Z9889 Other specified postprocedural states: Secondary | ICD-10-CM

## 2012-11-15 DIAGNOSIS — I059 Rheumatic mitral valve disease, unspecified: Secondary | ICD-10-CM

## 2012-11-15 DIAGNOSIS — I2510 Atherosclerotic heart disease of native coronary artery without angina pectoris: Secondary | ICD-10-CM

## 2012-11-15 DIAGNOSIS — Z951 Presence of aortocoronary bypass graft: Secondary | ICD-10-CM

## 2012-11-15 NOTE — Progress Notes (Signed)
[address]            [place] [address]          [phone]     CARDIOTHORACIC SURGERY OFFICE NOTE  Referring Provider is Verne Carrow D*MD PCP is No primary provider on file.   HPI:  Patient returns for followup status post coronary artery bypass grafting and mitral valve repair on 07/23/2012 for severe three-vessel coronary artery disease with ischemic cardiomyopathy and ischemic mitral regurgitation.  He was last seen here in our office on 08/16/2012. Since then he has been followed by Dr. Gala Romney in the heart failure clinic. He has continued to do very well clinically and return back to work. He returns for office today for routine followup. He reports that he is doing exceptionally well and he feels much better than he did prior to surgery. His exercise tolerance and his breathing is quite good. He states that he walks 6 miles a day and has been working full-time. Unfortunately he has gone back to smoking cigarettes, although he states that only occasionally smokes one or 2 cigarettes. He has no chest pain. He has no shortness of breath. He has mild numbness and paresthesias along the left anterior chest wall which is appropriate for previous left internal mammary artery harvest and grafting.   Current Outpatient Prescriptions  Medication Sig Dispense Refill  . albuterol (PROVENTIL HFA;VENTOLIN HFA) 108 (90 BASE) MCG/ACT inhaler Inhale 2 puffs into the lungs as needed. For shortness of breath or wheezing      . albuterol (PROVENTIL) (2.5 MG/3ML) 0.083% nebulizer solution Take 2.5 mg by nebulization daily as needed. For shortness of breath or wheezing      . aspirin EC 81 MG tablet Take 1 tablet (81 mg total) by mouth daily.      . bisoprolol (ZEBETA) 5 MG tablet Take 0.5 tablets (2.5 mg total) by mouth daily.  30 tablet  6  . ferrous sulfate 325 (65 FE) MG tablet Take 1 tablet (325 mg total) by mouth 2 (two) times daily with a meal.  60  tablet  3  . furosemide (LASIX) 40 MG tablet Take 40 mg by mouth daily.      Marland Kitchen losartan (COZAAR) 100 MG tablet Take 1 tablet (100 mg total) by mouth daily.  30 tablet  6  . spironolactone (ALDACTONE) 25 MG tablet Take 1 tablet (25 mg total) by mouth daily.  30 tablet  3      Physical Exam:   BP 122/67  Pulse 67  Resp 20  Ht 5\' 11"  (1.803 m)  Wt 167 lb (75.751 kg)  BMI 23.29 kg/m2  SpO2 97%  General:  Well-appearing  Chest:   Clear to auscultation  CV:   Regular rate and rhythm without murmur  Incisions:  Completely healed, sternum is stable  Abdomen:  Soft and nontender  Extremities:  Warm and well-perfused  Diagnostic Tests:  Transthoracic Echocardiography  Patient: Homero, Hyson MR #: [id number] Study Date: 10/18/2012 Gender: M Age: 61 Height: 182.9cm Weight: 73kg BSA: 1.81m^2 Pt. Status: Room:  PERFORMING [place], [facility], Provider W SONOGRAPHER Nolon Rod, RDCS ORDERING Clegg, Amy REFERRING Clegg, Amy cc:  ------------------------------------------------------------ LV EF: 45% - 50%  ------------------------------------------------------------ Indications: CHF - 428.0.  ------------------------------------------------------------ History: Risk factors: Former tobacco use.  ------------------------------------------------------------ Study Conclusions  - Left ventricle: The cavity size was normal. Wall thickness  was increased in a pattern of mild LVH. Systolic function was mildly reduced. The estimated ejection fraction was in the range of 45% to 50%. Wall motion was normal; there were no regional wall motion abnormalities. - Mitral valve: Prior procedures included surgical repair. An annular ring prosthesis was present. The findings are consistent with trivial stenosis. Valve area by pressure half-time: 1.73cm^2. Valve area by continuity equation (using LVOT flow): 1.46cm^2. - Left atrium: The atrium was mildly dilated. -  Pulmonary arteries: PA peak pressure: 35mm Hg (S).  ------------------------------------------------------------ Labs, prior tests, procedures, and surgery: (July 22, 2012). Mitral annuloplasty. Transthoracic echocardiography. M-mode, limited 2D, limited spectral Doppler, and color Doppler. Height: Height: 182.9cm. Height: 72in. Weight: Weight: 73kg. Weight: 160.7lb. Body mass index: BMI: 21.8kg/m^2. Body surface area: BSA: 1.93m^2. Patient status: Outpatient. Location: Echo laboratory.  ------------------------------------------------------------  ------------------------------------------------------------ Left ventricle: The cavity size was normal. Wall thickness was increased in a pattern of mild LVH. Systolic function was mildly reduced. The estimated ejection fraction was in the range of 45% to 50%. Wall motion was normal; there were no regional wall motion abnormalities.  ------------------------------------------------------------ Aortic valve: Structurally normal valve. Trileaflet. Cusp separation was normal. Doppler: Transvalvular velocity was within the normal range. There was no stenosis. No regurgitation.  ------------------------------------------------------------ Aorta: Aortic root: The aortic root was normal in size. Ascending aorta: The ascending aorta was normal in size.  ------------------------------------------------------------ Mitral valve: Prior procedures included surgical repair. An annular ring prosthesis was present. Doppler: The findings are consistent with trivial stenosis. No regurgitation. Valve area by pressure half-time: 1.73cm^2. Indexed valve area by pressure half-time: 0.89cm^2/m^2. Valve area by continuity equation (using LVOT flow): 1.46cm^2. Indexed valve area by continuity equation (using LVOT flow): 0.75cm^2/m^2. Mean gradient: 6mm Hg (D). Peak gradient: 12mm Hg  (D).  ------------------------------------------------------------ Left atrium: The atrium was mildly dilated.  ------------------------------------------------------------ Right ventricle: The cavity size was normal. Wall thickness was normal. Systolic function was normal.  ------------------------------------------------------------ Pulmonic valve: The valve appears to be grossly normal. Doppler: No significant regurgitation.  ------------------------------------------------------------ Tricuspid valve: Mildly thickened leaflets. Doppler: Mild regurgitation.  ------------------------------------------------------------ Right atrium: The atrium was normal in size.  ------------------------------------------------------------ Pericardium: The pericardium was normal in appearance.  ------------------------------------------------------------  2D measurements Normal Doppler measurements Normal Left ventricle Main pulmonary LVID ED, 52.9 mm 43-52 artery chord, Pressure, 35 mm Hg =30 PLAX S LVID ES, 40.9 mm 23-38 Left ventricle chord, Ea, lat 95.6 cm/s ------ PLAX ann, tiss FS, chord, 23 % >29 DP PLAX E/Ea, lat 1.39 ------ LVPW, ED 11.5 mm ------ ann, tiss IVS/LVPW 0.88 <1.3 DP ratio, ED Ea, med 133 cm/s ------ Vol ED, 123 ml ------ ann, tiss MOD1 DP Vol ES, 55 ml ------ E/Ea, med 1 ------ MOD1 ann, tiss EF, MOD1 55 % ------ DP Vol index, 63 ml/m^2 ------ LVOT ED, MOD1 Peak vel, 117 cm/s ------ Vol index, 28 ml/m^2 ------ S ES, MOD1 VTI, S 26.4 cm ------ Vol ED, 109 ml ------ Peak 5 mm Hg ------ MOD2 gradient, Vol ES, 50 ml ------ S MOD2 Mitral valve EF, MOD2 54 % ------ Peak E vel 133 cm/s ------ Stroke 59 ml ------ Peak A vel 95.6 cm/s ------ vol, MOD2 Mean vel, 120 cm/s ------ Vol index, 56 ml/m^2 ------ D ED, MOD2 Decelerati 373 ms 150-23 Vol index, 26 ml/m^2 ------ on time 0 ES, MOD2 Pressure 127 ms ------ Stroke 30.4 ml/m^2 ------ half-time index, Mean 6  mm Hg ------ MOD2 gradient, Ventricular septum D IVS, ED 10.1 mm ------ Peak 12 mm Hg ------  LVOT gradient, Diam, S 20 mm ------ D Area 3.14 cm^2 ------ Peak E/A 1.4 ------ Aorta ratio Root diam, 36 mm ------ Area (PHT) 1.73 cm^2 ------ ED Area index 0.89 cm^2/m ------ Left atrium (PHT) ^2 AP dim 45 mm ------ Area 1.46 cm^2 ------ AP dim 2.32 cm/m^2 <2.2 (LVOT) index continuity Area index 0.75 cm^2/m ------ (LVOT ^2 cont) Annulus 56.7 cm ------ VTI Tricuspid valve Regurg 252 cm/s ------ peak vel Peak RV-RA 25 mm Hg ------ gradient, S Systemic veins Estimated 10 mm Hg ------ CVP Right ventricle Pressure, 35 mm Hg <30 S  ------------------------------------------------------------ Prepared and Electronically Authenticated by  Cassell Clement 2013-11-25T16:31:[id number]    Impression:  Patient is doing exceptionally well 4 months status post coronary artery bypass grafting and mitral valve repair. Recent echocardiogram looked good with considerable improvement in left ventricular ejection fraction. Clinically the patient is doing very well. Unfortunately he has gone back to smoking cigarettes.  Plan:  I've strongly encouraged patient to find a way to quit smoking. In the future he will call and return to see [place] as needed. All of his questions been addressed.   Salvatore Decent. Cornelius Moras, MD 11/15/2012 12:20 PM

## 2012-11-15 NOTE — Patient Instructions (Signed)
The patient should make every effort to stop smoking immediately and permanently.  

## 2013-01-14 ENCOUNTER — Encounter (HOSPITAL_COMMUNITY): Payer: Self-pay | Admitting: Cardiology

## 2013-01-14 ENCOUNTER — Telehealth (HOSPITAL_COMMUNITY): Payer: Self-pay | Admitting: Cardiology

## 2013-01-14 NOTE — Telephone Encounter (Signed)
After multiple attempts to contact pt for follow up (FEB). Letter mailed

## 2013-03-26 ENCOUNTER — Other Ambulatory Visit (HOSPITAL_COMMUNITY): Payer: Self-pay | Admitting: Cardiology

## 2013-04-10 ENCOUNTER — Other Ambulatory Visit (HOSPITAL_COMMUNITY): Payer: Self-pay | Admitting: Cardiology

## 2013-05-23 ENCOUNTER — Other Ambulatory Visit (HOSPITAL_COMMUNITY): Payer: Self-pay | Admitting: Cardiology

## 2013-07-27 ENCOUNTER — Other Ambulatory Visit (HOSPITAL_COMMUNITY): Payer: Self-pay | Admitting: Cardiology

## 2013-08-18 ENCOUNTER — Encounter: Payer: Self-pay | Admitting: *Deleted

## 2014-04-06 ENCOUNTER — Encounter (HOSPITAL_COMMUNITY): Payer: Self-pay | Admitting: Emergency Medicine

## 2014-04-06 ENCOUNTER — Other Ambulatory Visit: Payer: Self-pay

## 2014-04-06 ENCOUNTER — Emergency Department (HOSPITAL_COMMUNITY): Payer: BC Managed Care – PPO

## 2014-04-06 ENCOUNTER — Emergency Department (HOSPITAL_COMMUNITY)
Admission: EM | Admit: 2014-04-06 | Discharge: 2014-04-07 | Disposition: A | Discharge status: Home / Self Care | Payer: BC Managed Care – PPO | Attending: Emergency Medicine | Admitting: Emergency Medicine

## 2014-04-06 DIAGNOSIS — Z79899 Other long term (current) drug therapy: Secondary | ICD-10-CM | POA: Insufficient documentation

## 2014-04-06 DIAGNOSIS — Z9889 Other specified postprocedural states: Secondary | ICD-10-CM | POA: Insufficient documentation

## 2014-04-06 DIAGNOSIS — Z87891 Personal history of nicotine dependence: Secondary | ICD-10-CM | POA: Insufficient documentation

## 2014-04-06 DIAGNOSIS — J189 Pneumonia, unspecified organism: Secondary | ICD-10-CM

## 2014-04-06 DIAGNOSIS — Z7982 Long term (current) use of aspirin: Secondary | ICD-10-CM | POA: Insufficient documentation

## 2014-04-06 DIAGNOSIS — I5020 Unspecified systolic (congestive) heart failure: Secondary | ICD-10-CM | POA: Insufficient documentation

## 2014-04-06 DIAGNOSIS — Z951 Presence of aortocoronary bypass graft: Secondary | ICD-10-CM | POA: Insufficient documentation

## 2014-04-06 DIAGNOSIS — Z8639 Personal history of other endocrine, nutritional and metabolic disease: Secondary | ICD-10-CM | POA: Insufficient documentation

## 2014-04-06 DIAGNOSIS — J449 Chronic obstructive pulmonary disease, unspecified: Secondary | ICD-10-CM | POA: Insufficient documentation

## 2014-04-06 DIAGNOSIS — J4489 Other specified chronic obstructive pulmonary disease: Secondary | ICD-10-CM | POA: Insufficient documentation

## 2014-04-06 DIAGNOSIS — D509 Iron deficiency anemia, unspecified: Secondary | ICD-10-CM | POA: Insufficient documentation

## 2014-04-06 DIAGNOSIS — J159 Unspecified bacterial pneumonia: Secondary | ICD-10-CM | POA: Insufficient documentation

## 2014-04-06 DIAGNOSIS — I2510 Atherosclerotic heart disease of native coronary artery without angina pectoris: Secondary | ICD-10-CM | POA: Insufficient documentation

## 2014-04-06 DIAGNOSIS — Z862 Personal history of diseases of the blood and blood-forming organs and certain disorders involving the immune mechanism: Secondary | ICD-10-CM | POA: Insufficient documentation

## 2014-04-06 LAB — CBC WITH DIFFERENTIAL/PLATELET
Basophils Absolute: 0 10*3/uL (ref 0.0–0.1)
Basophils Relative: 0 % (ref 0–1)
Eosinophils Absolute: 0 10*3/uL (ref 0.0–0.7)
Eosinophils Relative: 0 % (ref 0–5)
HEMATOCRIT: 37.8 % — AB (ref 39.0–52.0)
HEMOGLOBIN: 12.9 g/dL — AB (ref 13.0–17.0)
LYMPHS ABS: 1.1 10*3/uL (ref 0.7–4.0)
LYMPHS PCT: 8 % — AB (ref 12–46)
MCH: 30.8 pg (ref 26.0–34.0)
MCHC: 34.1 g/dL (ref 30.0–36.0)
MCV: 90.2 fL (ref 78.0–100.0)
MONO ABS: 1.3 10*3/uL — AB (ref 0.1–1.0)
MONOS PCT: 10 % (ref 3–12)
NEUTROS ABS: 10.8 10*3/uL — AB (ref 1.7–7.7)
Neutrophils Relative %: 82 % — ABNORMAL HIGH (ref 43–77)
Platelets: 284 10*3/uL (ref 150–400)
RBC: 4.19 MIL/uL — ABNORMAL LOW (ref 4.22–5.81)
RDW: 15.2 % (ref 11.5–15.5)
WBC: 13.2 10*3/uL — AB (ref 4.0–10.5)

## 2014-04-06 LAB — COMPREHENSIVE METABOLIC PANEL
ALBUMIN: 2.2 g/dL — AB (ref 3.5–5.2)
ALK PHOS: 155 U/L — AB (ref 39–117)
ALT: 34 U/L (ref 0–53)
AST: 90 U/L — AB (ref 0–37)
BUN: 23 mg/dL (ref 6–23)
CALCIUM: 8.8 mg/dL (ref 8.4–10.5)
CO2: 26 mEq/L (ref 19–32)
Chloride: 90 mEq/L — ABNORMAL LOW (ref 96–112)
Creatinine, Ser: 0.8 mg/dL (ref 0.50–1.35)
GFR calc non Af Amer: >90 mL/min (ref >90)
Glucose, Bld: 176 mg/dL — ABNORMAL HIGH (ref 70–99)
POTASSIUM: 3.7 meq/L (ref 3.7–5.3)
SODIUM: 128 meq/L — AB (ref 137–147)
TOTAL PROTEIN: 8.6 g/dL — AB (ref 6.0–8.3)
Total Bilirubin: 0.8 mg/dL (ref 0.3–1.2)

## 2014-04-06 LAB — URINE MICROSCOPIC-ADD ON

## 2014-04-06 LAB — URINALYSIS, ROUTINE W REFLEX MICROSCOPIC
GLUCOSE, UA: 100 mg/dL — AB
Ketones, ur: NEGATIVE mg/dL
Leukocytes, UA: NEGATIVE
NITRITE: NEGATIVE
PH: 5.5 (ref 5.0–8.0)
SPECIFIC GRAVITY, URINE: 1.02 (ref 1.005–1.030)
Urobilinogen, UA: 2 mg/dL — ABNORMAL HIGH (ref 0.0–1.0)

## 2014-04-06 LAB — PRO B NATRIURETIC PEPTIDE: PRO B NATRI PEPTIDE: 357.1 pg/mL — AB (ref 0–125)

## 2014-04-06 LAB — DIGOXIN LEVEL: Digoxin Level: <0.3 ng/mL — ABNORMAL LOW (ref 0.8–2.0)

## 2014-04-06 MED ORDER — SODIUM CHLORIDE 0.9 % IV BOLUS (SEPSIS)
1000.0000 mL | Freq: Once | INTRAVENOUS | Status: AC
  Administered 2014-04-06: 1000 mL via INTRAVENOUS

## 2014-04-06 MED ORDER — LEVOFLOXACIN 500 MG PO TABS: 500.0000 mg | ORAL_TABLET | Freq: Every day | ORAL | Status: DC

## 2014-04-06 MED ORDER — LEVOFLOXACIN IN D5W 500 MG/100ML IV SOLN
500.0000 mg | Freq: Once | INTRAVENOUS | Status: AC
  Administered 2014-04-06: 500 mg via INTRAVENOUS
  Filled 2014-04-06: qty 100

## 2014-04-06 NOTE — Discharge Instructions (Signed)
As discussed, it is important that you follow up as soon as possible with your physician for continued management of your condition.  If you develop any new, or concerning changes in your condition, please return to the emergency department immediately.   The single biggest thing you can do to increase the likelihood of improvement following this episode of pneumonia he is to stop smoking.

## 2014-04-06 NOTE — ED Notes (Signed)
Pt reporting generalized weakness for about 1 week.  Reports "All I am doing is sleep."  Pt also reporting blood in urine.

## 2014-04-06 NOTE — ED Notes (Signed)
Patient states he has been having fatigue and no energy x 1 week.  Patient states he rolled over on Sunday night and felt a "pop" in right side and has had blood in urine since then.

## 2014-04-06 NOTE — ED Provider Notes (Addendum)
CSN: [id number]     Arrival date & time 04/06/14  2033 History  This chart was scribed for Gerhard Munch, MD by Danella Maiers, ED Scribe. This patient was seen in room APA19/APA19 and the patient's care was started at 9:45 PM.    Chief Complaint  Patient presents with  . Fatigue   The history is provided by the patient. No language interpreter was used.   HPI Comments: Miguel York is a 63 y.o. male with a h/o heart disease who presents to the Emergency Department complaining of waxing and waning right middle back pain described as a catching sensation with associated hematuria since feeling a "pop" in the same area while rolling over in bed 4 nights ago.  Pain worsens with coughing. He denies blood in stool. He denies abdominal pain, rash, leg swelling. He is a former alcohol and tobacco user.   He also reports constant fatigue and SOB for one week. Pt states he was doing fine before one week ago. He reports a dizzy spell and near syncope today. He denies CP. He reports lack of appetite and states he has lost 6 pounds in the last week.     Past Medical History  Diagnosis Date  . COPD (chronic obstructive pulmonary disease)   . Systolic CHF 07/15/2012    Echo 08/03/12 EF 25%, diffuse hypokinesis, trivial MR, mild R/LAE, mod TR, PASP  . Coronary artery disease 07/15/2012    S/P 4v CABG 07/23/12  . 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 Ischemic cardiomyopathy     EF 25%, LifeVest placed 08/08/12  . History of tobacco abuse     quit 06/2012  . History of ETOH abuse     quit 06/2012  . Hyponatremia 07/2012  . Iron deficiency anemia 07/2012  . Elevated LFTs 07/2012  . HLD (hyperlipidemia)    Past Surgical History  Procedure Laterality Date  . Tee without cardioversion  07/19/2012    Procedure:  TRANSESOPHAGEAL ECHOCARDIOGRAM (TEE);  Surgeon: Pricilla Riffle, MD;  Location: [facility] ENDOSCOPY;  Service: Cardiovascular;  Laterality: N/A;  . Multiple extractions with alveoloplasty  07/20/2012    Procedure: MULTIPLE EXTRACION WITH ALVEOLOPLASTY;  Surgeon: Charlynne Pander, DDS;  Location: [facility] OR;  Service: Oral Surgery;  Laterality: N/A;  Extraction of tooth #'s 2,3,4,5,6,7,8,9,10,11, 17,19, 20, 21,22,27,28,29 with alveoloplasty  . Coronary artery bypass graft  07/23/2012    Procedure: CORONARY ARTERY BYPASS GRAFTING (CABG);  Surgeon: Purcell Nails, MD;  Location: [facility] OR;  Service: Open Heart Surgery;  Laterality: N/A;  . Mitral valve repair  07/23/2012    Procedure: MITRAL VALVE REPAIR (MVR);  Surgeon: Purcell Nails, MD;  Location: [facility] OR;  Service: Open Heart Surgery;  Laterality: N/A;  . Mouth surgery    . Clipping left atrial appendage  07/23/2012   Family History  Problem Relation Age of Onset  . Heart disease Mother   . Heart attack Mother   . Heart failure Mother   . Heart attack Sister   . Heart disease Sister    History  Substance Use Topics  . Smoking status: Former Smoker -- 1.50 packs/day for 46 years    Quit date: 07/01/2012  . Smokeless tobacco: Former Neurosurgeon  Types: Chew  . Alcohol Use: No     Comment: occ  quit august 2013    Review of Systems  Constitutional:       Per HPI, otherwise negative  HENT:       Per HPI, otherwise negative  Respiratory:       Per HPI, otherwise negative  Cardiovascular:       Per HPI, otherwise negative  Gastrointestinal: Negative for vomiting.  Endocrine:       Negative aside from HPI  Genitourinary:       Neg aside from HPI   Musculoskeletal:       Per HPI, otherwise negative  Skin: Negative.   Neurological: Negative for syncope.      Allergies  Review of patient's allergies indicates no known allergies.  Home Medications   Prior to Admission medications   Medication Sig Start Date End Date Taking? Authorizing Provider   albuterol (PROVENTIL HFA;VENTOLIN HFA) 108 (90 BASE) MCG/ACT inhaler Inhale 2 puffs into the lungs as needed. For shortness of breath or wheezing    Historical Provider, MD  albuterol (PROVENTIL) (2.5 MG/3ML) 0.083% nebulizer solution Take 2.5 mg by nebulization daily as needed. For shortness of breath or wheezing    Historical Provider, MD  aspirin EC 81 MG tablet Take 1 tablet (81 mg total) by mouth daily. 08/08/12   Jessica A Hope, PA-C  bisoprolol (ZEBETA) 5 MG tablet Take 0.5 tablets (2.5 mg total) by mouth daily. 08/08/12 08/08/13  Jessica A Hope, PA-C  ferrous sulfate 325 (65 FE) MG tablet Take 1 tablet (325 mg total) by mouth 2 (two) times daily with a meal. 08/08/12 08/08/13  Jessica A Hope, PA-C  furosemide (LASIX) 40 MG tablet Take 1 tablet (40 mg total) by mouth daily. 07/27/13   Dolores Pattyaniel R Bensimhon, MD  losartan (COZAAR) 100 MG tablet Take 1 tablet (100 mg total) by mouth daily. 09/16/12 09/16/13  Amy D Clegg, NP  spironolactone (ALDACTONE) 25 MG tablet TAKE 1 TABLET (25 MG TOTAL) BY MOUTH DAILY. 04/10/13   Dolores Pattyaniel R Bensimhon, MD   BP 122/76  Pulse 115  Temp(Src) 98.5 F (36.9 C) (Oral)  Resp 20  Ht 6' (1.829 m)  Wt 155 lb 3.2 oz (70.398 kg)  BMI 21.04 kg/m2  SpO2 95% Physical Exam  Nursing note and vitals reviewed. Constitutional: He is oriented to person, place, and time. He appears ill. No distress.  HENT:  Head: Normocephalic and atraumatic.  Eyes: Conjunctivae and EOM are normal.  Cardiovascular: Normal rate and regular rhythm.   Pulmonary/Chest: Effort normal and breath sounds normal. No stridor. No respiratory distress.  Trace wheezing on L  Abdominal: Soft. He exhibits no distension.  Musculoskeletal: He exhibits no edema.  Neurological: He is alert and oriented to person, place, and time.  Skin: Skin is warm and dry.  Psychiatric: He has a normal mood and affect.    ED Course  Procedures (including critical care time) Medications - No data to display  COORDINATION  OF CARE: 10:03 PM- Discussed treatment plan with pt. Pt agrees to plan.    Labs Review Labs Reviewed  CBC WITH DIFFERENTIAL - Abnormal; Notable for the following:    WBC 13.2 (*)    RBC 4.19 (*)    Hemoglobin 12.9 (*)    HCT 37.8 (*)    Neutrophils Relative % 82 (*)    Neutro Abs 10.8 (*)    Lymphocytes Relative 8 (*)    Monocytes Absolute 1.3 (*)  All other components within normal limits  COMPREHENSIVE METABOLIC PANEL - Abnormal; Notable for the following:    Sodium 128 (*)    Chloride 90 (*)    Glucose, Bld 176 (*)    Total Protein 8.6 (*)    Albumin 2.2 (*)    AST 90 (*)    Alkaline Phosphatase 155 (*)    All other components within normal limits  URINALYSIS, ROUTINE W REFLEX MICROSCOPIC - Abnormal; Notable for the following:    Glucose, UA 100 (*)    Hgb urine dipstick TRACE (*)    Bilirubin Urine SMALL (*)    Protein, ur TRACE (*)    Urobilinogen, UA 2.0 (*)    All other components within normal limits  DIGOXIN LEVEL - Abnormal; Notable for the following:    Digoxin Level <0.3 (*)    All other components within normal limits  PRO B NATRIURETIC PEPTIDE - Abnormal; Notable for the following:    Pro B Natriuretic peptide (BNP) 357.1 (*)    All other components within normal limits  URINE MICROSCOPIC-ADD ON - Abnormal; Notable for the following:    Casts HYALINE CASTS (*)    All other components within normal limits    Imaging Review Dg Chest 2 View  04/06/2014   CLINICAL DATA:  Shortness of breath for 1 week.  Cough and weakness.  EXAM: CHEST  2 VIEW  COMPARISON:  DG CHEST 2 VIEW dated 08/16/2012  FINDINGS: Interval development of airspace disease in the left lingula consistent with acute pneumonia. Right lung is clear. Normal heart size and pulmonary vascularity. Postoperative changes in the mediastinum.  IMPRESSION: Left lingular pneumonia.   Electronically Signed   By: Burman NievesWilliam  Stevens M.D.   On: 04/06/2014 22:51   EKG has sinus tachycardia, rate 104, left  ventricular hypertrophy, artifact, abnormal  O2- 95%ra, nml  11:52 PM Patient appeared calm.  Vital signs remained stable.  He is aware of all results.  MDM   I personally performed the services described in this documentation, which was scribed in my presence. The recorded information has been reviewed and is accurate.   Patient presents with weakness, cough.  On exam patient is awake, alert and interactive appropriately.  Patient is afebrile hemodynamically stable, though mildly tachycardic.  Patient has a soft, non-peritoneal abdomen, and no CVA tenderness.  Patient was started on therapy for pneumonia, will follow up with his primary care physician for trace hematuria, which may represent a previously passed kidney stone. I encouraged the patient to stop smoking, as a means of increasing health, and decreasing risks for pneumonia in the future. Patient received initial dose of antibiotics IV as well as IVF, he was discharged to follow up with primary care.   Gerhard Munchobert Judyann Casasola, MD 04/06/14 [id number]  Gerhard Munchobert Veyda Kaufman, MD 04/06/14 [phone]

## 2014-06-10 IMAGING — CR DG CHEST 2V
2 series · 2 of 2 positions shown · non-contrast
Comparison: 07/16/2012 and earlier.

CLINICAL DATA: 60-year-old male preoperative study, shortness of
breath.

CHEST - 2 VIEW

[w chest pa]
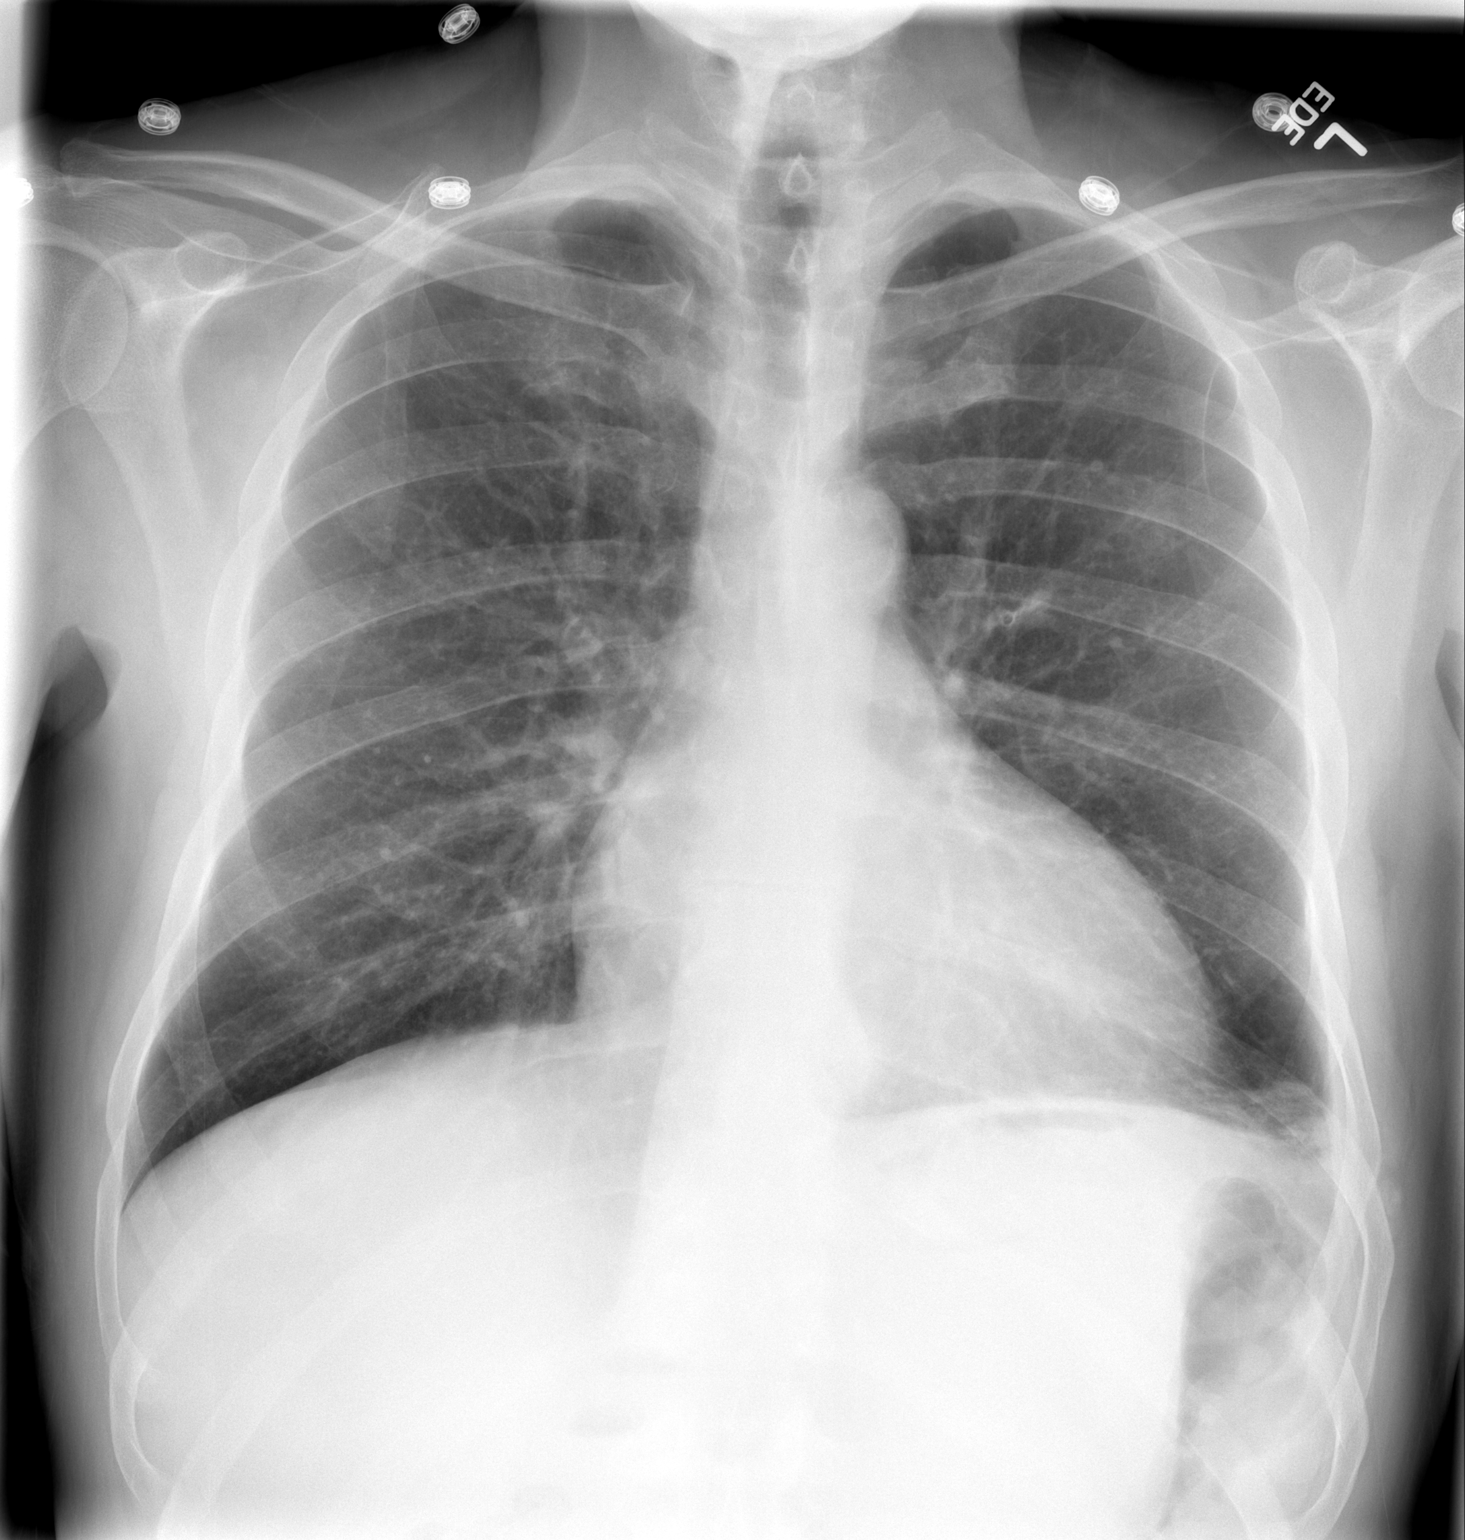

[w chest lat]
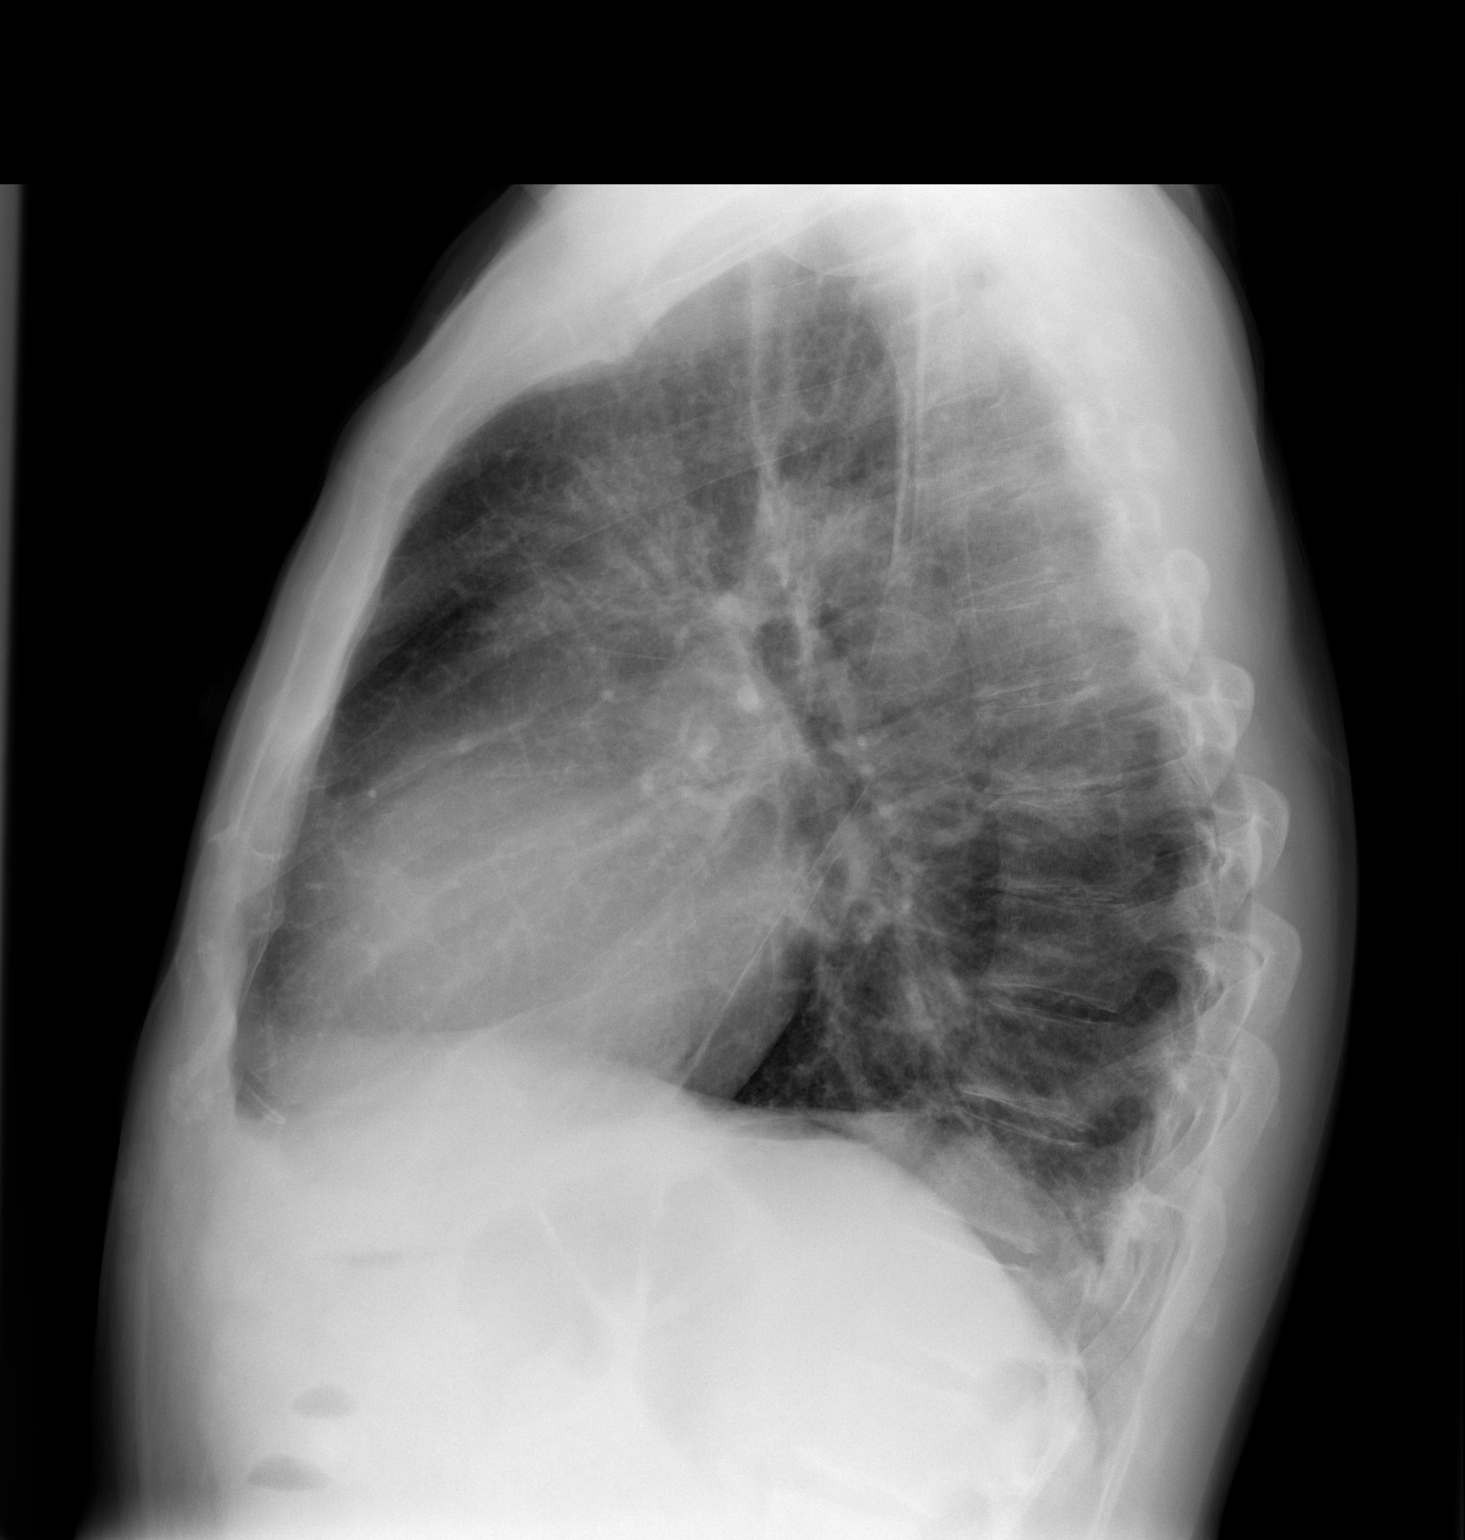

[2 of 2 positions shown; findings below may reference images not displayed]

FINDINGS: Stable mild cardiomegaly. Other mediastinal contours are
within normal limits.  Stable lung volumes.  No pneumothorax or
edema.  Linear and streaky opacity at the left lung base over the
series of exam has not significantly changed, suspect atelectasis
and/or scarring.  No consolidation or new pulmonary opacity. No
acute osseous abnormality identified.
IMPRESSION: Left lung base scarring or atelectasis suspected.  Otherwise no
acute cardiopulmonary abnormality.

## 2014-06-14 IMAGING — CR DG CHEST 1V PORT
1 series · 1 of 1 positions shown · non-contrast
Comparison: Two-view chest x-ray 07/19/2012 dating back to
05/11/2012.

CLINICAL DATA: Postop CABG and mitral valve replacement.

PORTABLE CHEST - 1 VIEW [DATE]/4128 2424 hours:

[AP]
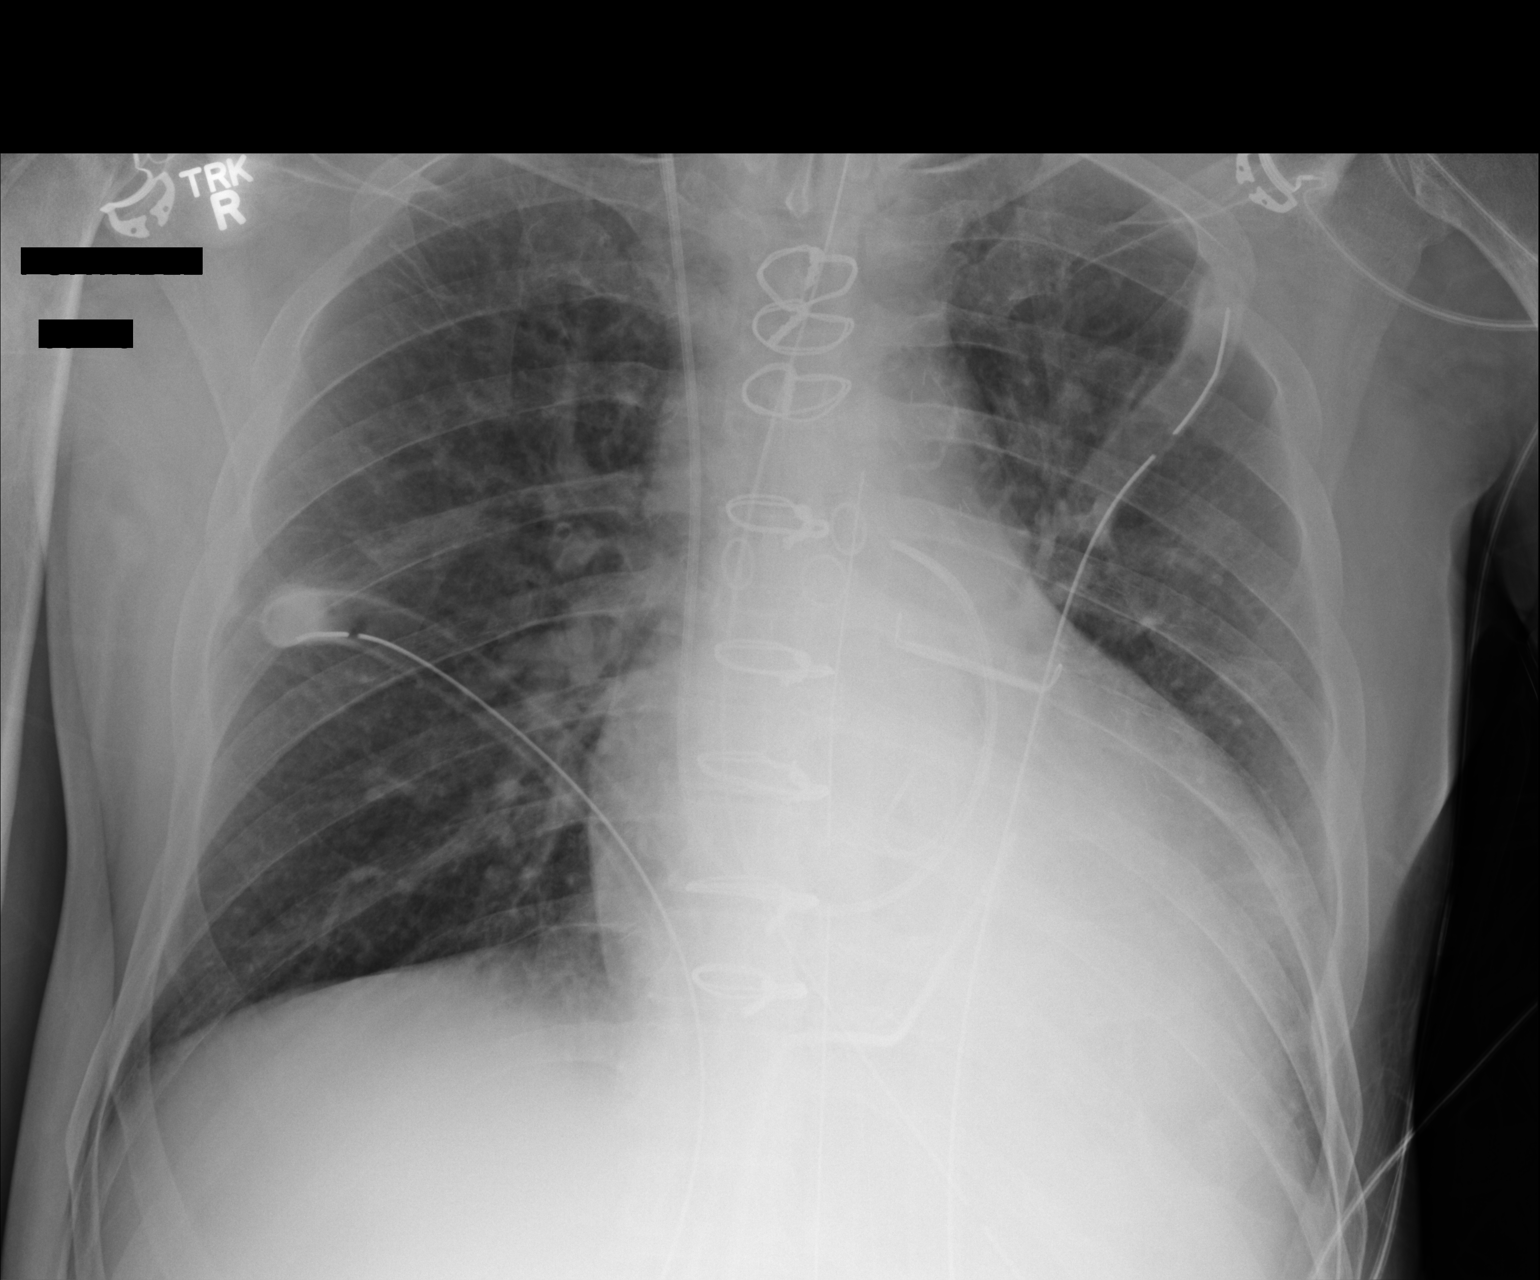

[1 of 1 positions shown; findings below may reference images not displayed]

FINDINGS: Sternotomy for CABG and mitral valve replacement.
Endotracheal tube tip in satisfactory position projecting
approximately 9 cm above the carina.  Swan-Ganz catheter tip
projects over the proximal right main pulmonary artery.  Bilateral
chest tubes in place with no pneumothorax.  Cardiac silhouette
enlarged but stable.  Pulmonary venous hypertension without overt
edema.  Atelectasis in the left lower lobe.  Lungs otherwise clear.
Small left pleural effusion.
IMPRESSION: Support apparatus satisfactory.  No pneumothorax.  Left lower lobe
atelectasis and small left pleural effusion post CABG.  Pulmonary
venous hypertension without overt edema.

## 2014-06-15 IMAGING — CR DG CHEST 1V PORT
1 series · 1 of 1 positions shown · non-contrast
Comparison: the previous day's study

CLINICAL DATA: Post heart surgery, chest tubes

PORTABLE CHEST - 1 VIEW

[AP]
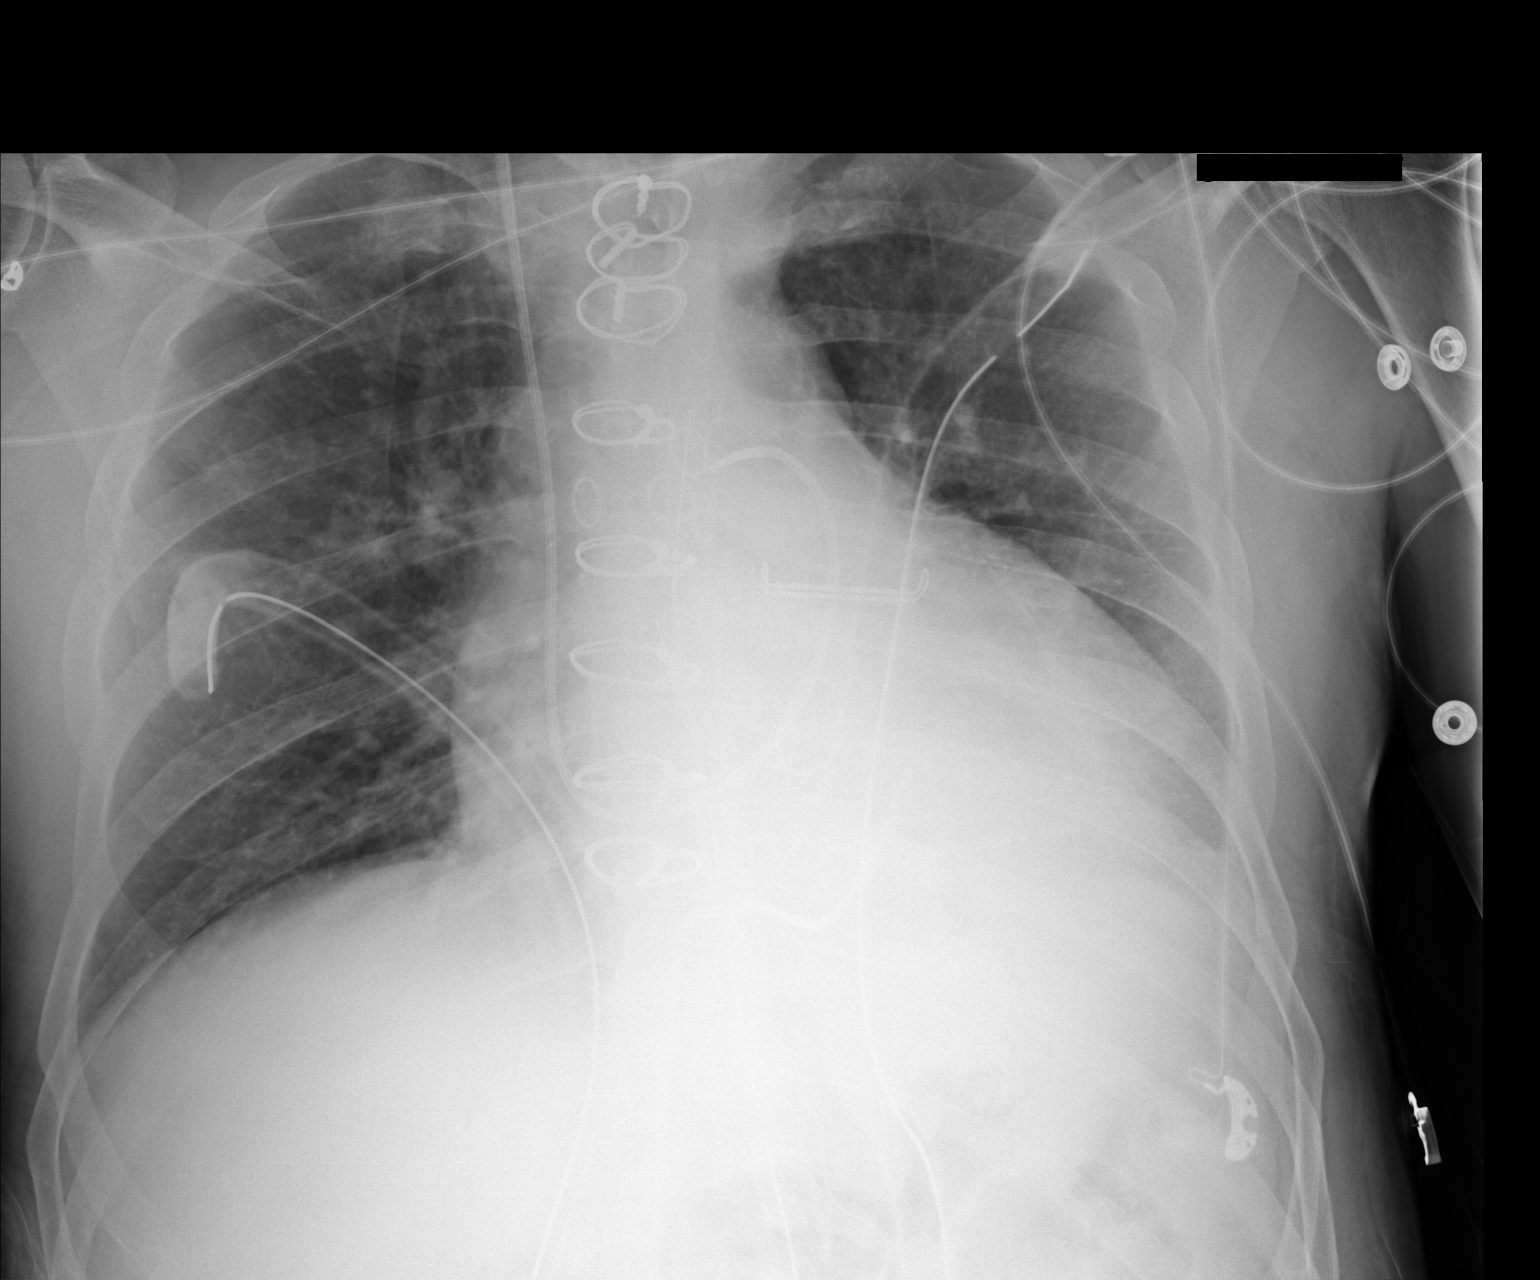

[1 of 1 positions shown; findings below may reference images not displayed]

FINDINGS: The patient has been extubated and the nasogastric tube
removed.  Bilateral chest tubes remain in place with no
pneumothorax.  Right IJ Lesya extends to the proximal right
pulmonary artery.  Mediastinal drain stable.  Persistent left
retrocardiac consolidation / atelectasis.  Mild central pulmonary
vascular congestion.  Small left pleural effusion.  Stable
cardiomegaly.  Changes of CABG and valve surgery with left atrial
clip as before.
IMPRESSION: 1.  Extubation with persistent left retrocardiac consolidation /
atelectasis.
2.  Stable cardiomegaly and small left effusion

## 2015-06-03 ENCOUNTER — Encounter (HOSPITAL_COMMUNITY): Payer: Self-pay | Admitting: Emergency Medicine

## 2015-06-03 ENCOUNTER — Inpatient Hospital Stay (HOSPITAL_COMMUNITY): Payer: BLUE CROSS/BLUE SHIELD

## 2015-06-03 ENCOUNTER — Emergency Department (HOSPITAL_COMMUNITY): Payer: BLUE CROSS/BLUE SHIELD

## 2015-06-03 ENCOUNTER — Inpatient Hospital Stay (HOSPITAL_COMMUNITY)
Admission: EM | Admit: 2015-06-03 | Discharge: 2015-06-09 | DRG: 871 | Disposition: A | Discharge status: Home / Self Care | Payer: BLUE CROSS/BLUE SHIELD | Attending: Internal Medicine | Admitting: Internal Medicine

## 2015-06-03 DIAGNOSIS — I2510 Atherosclerotic heart disease of native coronary artery without angina pectoris: Secondary | ICD-10-CM | POA: Diagnosis not present

## 2015-06-03 DIAGNOSIS — I429 Cardiomyopathy, unspecified: Secondary | ICD-10-CM | POA: Diagnosis not present

## 2015-06-03 DIAGNOSIS — Z951 Presence of aortocoronary bypass graft: Secondary | ICD-10-CM | POA: Diagnosis not present

## 2015-06-03 DIAGNOSIS — I2699 Other pulmonary embolism without acute cor pulmonale: Secondary | ICD-10-CM | POA: Diagnosis present

## 2015-06-03 DIAGNOSIS — Z79899 Other long term (current) drug therapy: Secondary | ICD-10-CM

## 2015-06-03 DIAGNOSIS — A419 Sepsis, unspecified organism: Principal | ICD-10-CM

## 2015-06-03 DIAGNOSIS — I348 Other nonrheumatic mitral valve disorders: Secondary | ICD-10-CM | POA: Diagnosis not present

## 2015-06-03 DIAGNOSIS — E860 Dehydration: Secondary | ICD-10-CM | POA: Diagnosis not present

## 2015-06-03 DIAGNOSIS — D689 Coagulation defect, unspecified: Secondary | ICD-10-CM | POA: Diagnosis not present

## 2015-06-03 DIAGNOSIS — E876 Hypokalemia: Secondary | ICD-10-CM | POA: Diagnosis not present

## 2015-06-03 DIAGNOSIS — D696 Thrombocytopenia, unspecified: Secondary | ICD-10-CM | POA: Diagnosis not present

## 2015-06-03 DIAGNOSIS — R0602 Shortness of breath: Secondary | ICD-10-CM | POA: Diagnosis not present

## 2015-06-03 DIAGNOSIS — R197 Diarrhea, unspecified: Secondary | ICD-10-CM | POA: Diagnosis not present

## 2015-06-03 DIAGNOSIS — J449 Chronic obstructive pulmonary disease, unspecified: Secondary | ICD-10-CM | POA: Diagnosis not present

## 2015-06-03 DIAGNOSIS — I472 Ventricular tachycardia, unspecified: Secondary | ICD-10-CM | POA: Diagnosis present

## 2015-06-03 DIAGNOSIS — Z7982 Long term (current) use of aspirin: Secondary | ICD-10-CM

## 2015-06-03 DIAGNOSIS — J189 Pneumonia, unspecified organism: Secondary | ICD-10-CM | POA: Diagnosis present

## 2015-06-03 DIAGNOSIS — I509 Heart failure, unspecified: Secondary | ICD-10-CM

## 2015-06-03 DIAGNOSIS — E871 Hypo-osmolality and hyponatremia: Secondary | ICD-10-CM | POA: Diagnosis present

## 2015-06-03 DIAGNOSIS — Z8249 Family history of ischemic heart disease and other diseases of the circulatory system: Secondary | ICD-10-CM

## 2015-06-03 DIAGNOSIS — N179 Acute kidney failure, unspecified: Secondary | ICD-10-CM | POA: Diagnosis present

## 2015-06-03 DIAGNOSIS — R6520 Severe sepsis without septic shock: Secondary | ICD-10-CM | POA: Diagnosis present

## 2015-06-03 DIAGNOSIS — F17210 Nicotine dependence, cigarettes, uncomplicated: Secondary | ICD-10-CM | POA: Diagnosis not present

## 2015-06-03 DIAGNOSIS — R791 Abnormal coagulation profile: Secondary | ICD-10-CM | POA: Diagnosis not present

## 2015-06-03 DIAGNOSIS — A403 Sepsis due to Streptococcus pneumoniae: Secondary | ICD-10-CM | POA: Diagnosis not present

## 2015-06-03 DIAGNOSIS — I5023 Acute on chronic systolic (congestive) heart failure: Secondary | ICD-10-CM | POA: Diagnosis present

## 2015-06-03 DIAGNOSIS — R079 Chest pain, unspecified: Secondary | ICD-10-CM | POA: Diagnosis not present

## 2015-06-03 DIAGNOSIS — R7881 Bacteremia: Secondary | ICD-10-CM | POA: Diagnosis present

## 2015-06-03 DIAGNOSIS — I255 Ischemic cardiomyopathy: Secondary | ICD-10-CM | POA: Diagnosis present

## 2015-06-03 DIAGNOSIS — B953 Streptococcus pneumoniae as the cause of diseases classified elsewhere: Secondary | ICD-10-CM | POA: Diagnosis not present

## 2015-06-03 DIAGNOSIS — Z9889 Other specified postprocedural states: Secondary | ICD-10-CM

## 2015-06-03 DIAGNOSIS — J154 Pneumonia due to other streptococci: Secondary | ICD-10-CM | POA: Diagnosis not present

## 2015-06-03 DIAGNOSIS — I5022 Chronic systolic (congestive) heart failure: Secondary | ICD-10-CM | POA: Diagnosis present

## 2015-06-03 DIAGNOSIS — R7989 Other specified abnormal findings of blood chemistry: Secondary | ICD-10-CM

## 2015-06-03 LAB — COMPREHENSIVE METABOLIC PANEL
ALT: 29 U/L (ref 17–63)
ANION GAP: 15 (ref 5–15)
AST: 45 U/L — AB (ref 15–41)
Albumin: 2.8 g/dL — ABNORMAL LOW (ref 3.5–5.0)
Alkaline Phosphatase: 32 U/L — ABNORMAL LOW (ref 38–126)
BILIRUBIN TOTAL: 2.1 mg/dL — AB (ref 0.3–1.2)
BUN: 46 mg/dL — AB (ref 6–20)
CO2: 19 mmol/L — AB (ref 22–32)
Calcium: 8.7 mg/dL — ABNORMAL LOW (ref 8.9–10.3)
Chloride: 98 mmol/L — ABNORMAL LOW (ref 101–111)
Creatinine, Ser: 2.72 mg/dL — ABNORMAL HIGH (ref 0.61–1.24)
GFR calc Af Amer: 27 mL/min — ABNORMAL LOW (ref >60)
GFR, EST NON AFRICAN AMERICAN: 23 mL/min — AB (ref >60)
Glucose, Bld: 130 mg/dL — ABNORMAL HIGH (ref 65–99)
POTASSIUM: 3.4 mmol/L — AB (ref 3.5–5.1)
Sodium: 132 mmol/L — ABNORMAL LOW (ref 135–145)
Total Protein: 7.7 g/dL (ref 6.5–8.1)

## 2015-06-03 LAB — BLOOD GAS, ARTERIAL
Acid-base deficit: 6.3 mmol/L — ABNORMAL HIGH (ref 0.0–2.0)
BICARBONATE: 16.6 meq/L — AB (ref 20.0–24.0)
DRAWN BY: 22179
O2 Content: 2 L/min
O2 Saturation: 95.3 %
PATIENT TEMPERATURE: 37
TCO2: 14.6 mmol/L (ref 0–100)
pCO2 arterial: 22.6 mmHg — ABNORMAL LOW (ref 35.0–45.0)
pH, Arterial: 7.48 — ABNORMAL HIGH (ref 7.350–7.450)
pO2, Arterial: 75.6 mmHg — ABNORMAL LOW (ref 80.0–100.0)

## 2015-06-03 LAB — CBC WITH DIFFERENTIAL/PLATELET
BASOS ABS: 0 10*3/uL (ref 0.0–0.1)
Basophils Relative: 0 % (ref 0–1)
Eosinophils Absolute: 0 10*3/uL (ref 0.0–0.7)
Eosinophils Relative: 0 % (ref 0–5)
HEMATOCRIT: 39.5 % (ref 39.0–52.0)
Hemoglobin: 13.3 g/dL (ref 13.0–17.0)
LYMPHS PCT: 4 % — AB (ref 12–46)
Lymphs Abs: 0.4 10*3/uL — ABNORMAL LOW (ref 0.7–4.0)
MCH: 29.3 pg (ref 26.0–34.0)
MCHC: 33.7 g/dL (ref 30.0–36.0)
MCV: 87 fL (ref 78.0–100.0)
MONOS PCT: 3 % (ref 3–12)
Monocytes Absolute: 0.3 10*3/uL (ref 0.1–1.0)
Neutro Abs: 8.2 10*3/uL — ABNORMAL HIGH (ref 1.7–7.7)
Neutrophils Relative %: 93 % — ABNORMAL HIGH (ref 43–77)
Platelets: 129 10*3/uL — ABNORMAL LOW (ref 150–400)
RBC: 4.54 MIL/uL (ref 4.22–5.81)
RDW: 17.2 % — AB (ref 11.5–15.5)
WBC: 8.9 10*3/uL (ref 4.0–10.5)

## 2015-06-03 LAB — I-STAT CG4 LACTIC ACID, ED: LACTIC ACID, VENOUS: 6.69 mmol/L — AB (ref 0.5–2.0)

## 2015-06-03 LAB — PROTIME-INR
INR: 1.63 — ABNORMAL HIGH (ref 0.00–1.49)
Prothrombin Time: 19.3 seconds — ABNORMAL HIGH (ref 11.6–15.2)

## 2015-06-03 LAB — BRAIN NATRIURETIC PEPTIDE: B NATRIURETIC PEPTIDE 5: 551 pg/mL — AB (ref 0.0–100.0)

## 2015-06-03 LAB — PROCALCITONIN: Procalcitonin: 16.78 ng/mL

## 2015-06-03 LAB — LIPASE, BLOOD: Lipase: 17 U/L — ABNORMAL LOW (ref 22–51)

## 2015-06-03 LAB — D-DIMER, QUANTITATIVE (NOT AT ARMC): D DIMER QUANT: 2.52 ug{FEU}/mL — AB (ref 0.00–0.48)

## 2015-06-03 LAB — MAGNESIUM: Magnesium: 1.8 mg/dL (ref 1.7–2.4)

## 2015-06-03 LAB — LACTIC ACID, PLASMA: Lactic Acid, Venous: 5.6 mmol/L (ref 0.5–2.0)

## 2015-06-03 LAB — TROPONIN I: Troponin I: <0.03 ng/mL (ref <0.031)

## 2015-06-03 MED ORDER — HEPARIN (PORCINE) IN NACL 100-0.45 UNIT/ML-% IJ SOLN
1000.0000 [IU]/h | INTRAMUSCULAR | Status: DC
  Administered 2015-06-03: 1000 [IU]/h via INTRAVENOUS
  Filled 2015-06-03: qty 250

## 2015-06-03 MED ORDER — IPRATROPIUM-ALBUTEROL 0.5-2.5 (3) MG/3ML IN SOLN
3.0000 mL | Freq: Once | RESPIRATORY_TRACT | Status: AC
  Administered 2015-06-03: 3 mL via RESPIRATORY_TRACT
  Filled 2015-06-03: qty 3

## 2015-06-03 MED ORDER — PIPERACILLIN-TAZOBACTAM 3.375 G IVPB 30 MIN
3.3750 g | Freq: Three times a day (TID) | INTRAVENOUS | Status: DC
Start: 2015-06-03 — End: 2015-06-03

## 2015-06-03 MED ORDER — HEPARIN BOLUS VIA INFUSION
4000.0000 [IU] | Freq: Once | INTRAVENOUS | Status: AC
  Administered 2015-06-03: 4000 [IU] via INTRAVENOUS

## 2015-06-03 MED ORDER — LORAZEPAM 2 MG/ML IJ SOLN
2.0000 mg | INTRAMUSCULAR | Status: DC | PRN
Start: 2015-06-03 — End: 2015-06-03

## 2015-06-03 MED ORDER — SODIUM CHLORIDE 0.9 % IV SOLN
Freq: Once | INTRAVENOUS | Status: AC
  Administered 2015-06-03: 17:00:00 via INTRAVENOUS

## 2015-06-03 MED ORDER — ADULT MULTIVITAMIN W/MINERALS CH
1.0000 | ORAL_TABLET | Freq: Every day | ORAL | Status: DC
  Administered 2015-06-04 – 2015-06-09 (×6): 1 via ORAL
  Filled 2015-06-03 (×6): qty 1

## 2015-06-03 MED ORDER — POTASSIUM CHLORIDE CRYS ER 20 MEQ PO TBCR
40.0000 meq | EXTENDED_RELEASE_TABLET | Freq: Once | ORAL | Status: AC
  Administered 2015-06-03: 40 meq via ORAL
  Filled 2015-06-03: qty 2

## 2015-06-03 MED ORDER — SODIUM CHLORIDE 0.9 % IV SOLN: Freq: Once | INTRAVENOUS | Status: DC

## 2015-06-03 MED ORDER — MAGNESIUM SULFATE 2 GM/50ML IV SOLN
2.0000 g | Freq: Once | INTRAVENOUS | Status: AC
  Administered 2015-06-03: 2 g via INTRAVENOUS
  Filled 2015-06-03: qty 50

## 2015-06-03 MED ORDER — LORAZEPAM 1 MG PO TABS: 1.0000 mg | ORAL_TABLET | Freq: Four times a day (QID) | ORAL | Status: DC | PRN

## 2015-06-03 MED ORDER — FOLIC ACID 1 MG PO TABS
1.0000 mg | ORAL_TABLET | Freq: Every day | ORAL | Status: DC
  Administered 2015-06-04: 1 mg via ORAL
  Filled 2015-06-03: qty 1

## 2015-06-03 MED ORDER — SODIUM CHLORIDE 0.9 % IV SOLN: INTRAVENOUS | Status: DC

## 2015-06-03 MED ORDER — VANCOMYCIN HCL IN DEXTROSE 750-5 MG/150ML-% IV SOLN
750.0000 mg | INTRAVENOUS | Status: DC
  Filled 2015-06-03: qty 150

## 2015-06-03 MED ORDER — ASPIRIN 81 MG PO CHEW
324.0000 mg | CHEWABLE_TABLET | Freq: Once | ORAL | Status: AC
  Administered 2015-06-03: 324 mg via ORAL
  Filled 2015-06-03: qty 4

## 2015-06-03 MED ORDER — LORAZEPAM 2 MG/ML IJ SOLN: 1.0000 mg | Freq: Four times a day (QID) | INTRAMUSCULAR | Status: DC | PRN

## 2015-06-03 MED ORDER — VITAMIN B-1 100 MG PO TABS
100.0000 mg | ORAL_TABLET | Freq: Every day | ORAL | Status: DC
  Administered 2015-06-04 – 2015-06-09 (×6): 100 mg via ORAL
  Filled 2015-06-03 (×6): qty 1

## 2015-06-03 MED ORDER — PIPERACILLIN-TAZOBACTAM 3.375 G IVPB
3.3750 g | Freq: Three times a day (TID) | INTRAVENOUS | Status: DC
  Administered 2015-06-04 – 2015-06-06 (×8): 3.375 g via INTRAVENOUS
  Filled 2015-06-03 (×11): qty 50

## 2015-06-03 MED ORDER — SODIUM CHLORIDE 0.9 % IV BOLUS (SEPSIS)
1000.0000 mL | Freq: Once | INTRAVENOUS | Status: AC
  Administered 2015-06-03: 1000 mL via INTRAVENOUS

## 2015-06-03 MED ORDER — SODIUM CHLORIDE 0.9 % IV BOLUS (SEPSIS): 1000.0000 mL | INTRAVENOUS | Status: AC

## 2015-06-03 MED ORDER — IPRATROPIUM-ALBUTEROL 0.5-2.5 (3) MG/3ML IN SOLN
RESPIRATORY_TRACT | Status: AC
  Administered 2015-06-03: 3 mL
  Filled 2015-06-03: qty 3

## 2015-06-03 MED ORDER — METHYLPREDNISOLONE SODIUM SUCC 125 MG IJ SOLR
125.0000 mg | Freq: Once | INTRAMUSCULAR | Status: AC
  Administered 2015-06-03: 125 mg via INTRAVENOUS
  Filled 2015-06-03: qty 2

## 2015-06-03 MED ORDER — PHENYLEPHRINE HCL 10 MG/ML IJ SOLN
0.0000 ug/min | INTRAVENOUS | Status: DC
  Filled 2015-06-03: qty 1

## 2015-06-03 MED ORDER — PIPERACILLIN-TAZOBACTAM 3.375 G IVPB 30 MIN
3.3750 g | Freq: Once | INTRAVENOUS | Status: AC
  Administered 2015-06-03: 3.375 g via INTRAVENOUS
  Filled 2015-06-03: qty 50

## 2015-06-03 MED ORDER — VANCOMYCIN HCL IN DEXTROSE 1-5 GM/200ML-% IV SOLN
1000.0000 mg | Freq: Once | INTRAVENOUS | Status: AC
  Administered 2015-06-03: 1000 mg via INTRAVENOUS
  Filled 2015-06-03: qty 200

## 2015-06-03 MED ORDER — SODIUM CHLORIDE 0.9 % IV BOLUS (SEPSIS): 1000.0000 mL | Freq: Once | INTRAVENOUS | Status: DC

## 2015-06-03 NOTE — ED Notes (Signed)
MD notified of lactic acid critical result.

## 2015-06-03 NOTE — ED Notes (Signed)
Report [facility]Ann [facility] all questions answered. Report also given to [facility], they have an ETA of 20 mins

## 2015-06-03 NOTE — Progress Notes (Signed)
ANTIBIOTIC CONSULT NOTE -   Pharmacy Consult for Vancomycin & Zosyn Indication: sepsis  No Known Allergies  Patient Measurements: Height: 6' (182.9 cm) Weight: 135 lb (61.236 kg) IBW/kg (Calculated) : 77.6 Adjusted Body Weight:   Vital Signs: Temp: 100.7 F (38.2 C) (07/10 1553) Temp Source: Rectal (07/10 1553) BP: 106/52 mmHg (07/10 1600) Pulse Rate: 63 (07/10 1600) Intake/Output from previous day:   Intake/Output from this shift:    Labs:  Recent Labs  06/03/15 1515  WBC 8.9  HGB 13.3  PLT 129*  CREATININE 2.72*   Estimated Creatinine Clearance: 24.4 mL/min (by C-G formula based on Cr of 2.72). No results for input(s): VANCOTROUGH, VANCOPEAK, VANCORANDOM, GENTTROUGH, GENTPEAK, GENTRANDOM, TOBRATROUGH, TOBRAPEAK, TOBRARND, AMIKACINPEAK, AMIKACINTROU, AMIKACIN in the last 72 hours.   Microbiology: No results found for this or any previous visit (from the past 720 hour(s)).  Anti-infectives    Start     Dose/Rate Route Frequency Ordered Stop   06/04/15 1700  vancomycin (VANCOCIN) IVPB 750 mg/150 ml premix     750 mg 150 mL/hr over 60 Minutes Intravenous Every 24 hours 06/03/15 1605     06/04/15 0000  piperacillin-tazobactam (ZOSYN) IVPB 3.375 g     3.375 g 12.5 mL/hr over 240 Minutes Intravenous Every 8 hours 06/03/15 1605     06/03/15 1530  piperacillin-tazobactam (ZOSYN) IVPB 3.375 g     3.375 g 100 mL/hr over 30 Minutes Intravenous  Once 06/03/15 1526     06/03/15 1530  vancomycin (VANCOCIN) IVPB 1000 mg/200 mL premix     1,000 mg 200 mL/hr over 60 Minutes Intravenous  Once 06/03/15 1526        Assessment: 64 yo male, ED patient  Reduced renal function. CrCl 24.4 ml/min Vancomycin 1 GM IV and Zosyn 3.375 GM IV given in ED  Goal of Therapy:  Vancomycin trough level 15-20 mcg/ml  Plan:  Zosyn 3.375 GM IV every 8 hours, starting 8 hours after ED dose Vancomycin 750 mg IV every 24 hours, starting 24 hours after ED dose Vancomycin trough at steady  state Monitor renal function Labs per protocol  Raquel JamesPittman, Brandilynn Taormina Bennett 06/03/2015,4:06 PM

## 2015-06-03 NOTE — ED Notes (Signed)
Problem with in room monitors, vs not saved, however VS stable since transfer to room 01

## 2015-06-03 NOTE — ED Notes (Signed)
[facility] here to transport patient, all paperwork given to them

## 2015-06-03 NOTE — Progress Notes (Addendum)
ANTICOAGULATION CONSULT NOTE - Initial Consult  Pharmacy Consult for Heparin Indication: pulmonary embolus  No Known Allergies  Patient Measurements: Height: 6' (182.9 cm) Weight: 135 lb (61.236 kg) IBW/kg (Calculated) : 77.6 Heparin Dosing Weight: 61.2 kg  Vital Signs: Temp: 100.7 F (38.2 C) (07/10 1553) Temp Source: Rectal (07/10 1553) BP: 106/52 mmHg (07/10 1600) Pulse Rate: 63 (07/10 1600)  Labs:  Recent Labs  06/03/15 1515  HGB 13.3  HCT 39.5  PLT 129*  LABPROT 19.3*  INR 1.63*  CREATININE 2.72*  TROPONINI <0.03    Estimated Creatinine Clearance: 24.4 mL/min (by C-G formula based on Cr of 2.72).   Medical History: Past Medical History  Diagnosis Date  . COPD (chronic obstructive pulmonary disease)   . Systolic CHF 07/15/2012    Echo 08/03/12 EF 25%, diffuse hypokinesis, trivial MR, mild R/LAE, mod TR, PASP 31mmHg  . Coronary artery disease 07/15/2012    S/P 4v CABG 07/23/12  . 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 Ischemic cardiomyopathy     EF 25%, LifeVest placed 08/08/12  . History of tobacco abuse     quit 06/2012  . History of ETOH abuse     quit 06/2012  . Hyponatremia 07/2012  . Iron deficiency anemia 07/2012  . Elevated LFTs 07/2012  . HLD (hyperlipidemia)     Medications:   (Not in a hospital admission)  Assessment: 64 yo male, ED patient Labs reviewed PTA medications reviewed ED MD note presently not available  Goal of Therapy:  Heparin level 0.3-0.7 units/ml Monitor platelets by anticoagulation protocol: Yes   Plan:  Give 4000 units bolus x 1 Start heparin infusion at 1000 units/hr Check anti-Xa level in 6-8 hours and daily while on heparin Continue to monitor H&H and platelets  Raquel JamesPittman, Mirage Pfefferkorn Bennett 06/03/2015,5:06 PM

## 2015-06-03 NOTE — ED Provider Notes (Signed)
CSN: [id number]     Arrival date & time 06/03/15  1445 History   First MD Initiated Contact with Patient 06/03/15 1459     Chief Complaint  Patient presents with  . Shortness of Breath     (Consider location/radiation/quality/duration/timing/severity/associated sxs/prior Treatment) HPI Comments: Level 5 caveat for acuity of condition. Difficulty breathing and respiratory distress for the past 2 days. Has not had any meds for 2 years. Hx 64 year old with a history of chronic systolic heart failure EF 15 %, ICM, CAD S/P CABG x 3 07/23/12 (LIMA to LAD, SVG to PDA, SVG to second of his marginal, SVG to first diagonal, and additionally had clipping of the left atrial appendage), EF 45% in 2013.  Cough productive of yellow mucus with blood streaks, no fever.  Chest pain with coughing.  States has not had meds or been to doctor in 2 years.  No leg pain or swelling. No history of blood clots. No fevers. No sick contacts. No recent travel.  The history is provided by the patient. The history is limited by the condition of the patient.    Past Medical History  Diagnosis Date  . COPD (chronic obstructive pulmonary disease)   . Systolic CHF 07/15/2012    Echo 08/03/12 EF 25%, diffuse hypokinesis, trivial MR, mild R/LAE, mod TR, PASP 31mmHg  . Coronary artery disease 07/15/2012    S/P 4v CABG 07/23/12  . 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 Ischemic cardiomyopathy     EF 25%, LifeVest placed 08/08/12  . History of tobacco abuse     quit 06/2012  . History of ETOH abuse     quit 06/2012  . Hyponatremia 07/2012  . Iron deficiency anemia 07/2012  . Elevated LFTs 07/2012  . HLD (hyperlipidemia)    Past Surgical History  Procedure Laterality Date  . Tee without cardioversion  07/19/2012    Procedure: TRANSESOPHAGEAL  ECHOCARDIOGRAM (TEE);  Surgeon: Pricilla RifflePaula V Ross, MD;  [facility];  Service: Cardiovascular;  Laterality: N/A;  . Multiple extractions with alveoloplasty  07/20/2012    Procedure: MULTIPLE EXTRACION WITH ALVEOLOPLASTY;  Surgeon: Charlynne Panderonald F Kulinski, DDS;  [facility];  Service: Oral Surgery;  Laterality: N/A;  Extraction of tooth #'s 2,3,4,5,6,7,8,9,10,11, 17,19, 20, 21,22,27,28,29 with alveoloplasty  . Coronary artery bypass graft  07/23/2012    Procedure: CORONARY ARTERY BYPASS GRAFTING (CABG);  Surgeon: Purcell Nailslarence H Owen, MD;  [facility];  Service: Open Heart Surgery;  Laterality: N/A;  . Mitral valve repair  07/23/2012    Procedure: MITRAL VALVE REPAIR (MVR);  Surgeon: Purcell Nailslarence H Owen, MD;  [facility];  Service: Open Heart Surgery;  Laterality: N/A;  . Mouth surgery    . Clipping left atrial appendage  07/23/2012   Family History  Problem Relation Age of Onset  . Heart disease Mother   . Heart attack Mother   . Heart failure Mother   . Heart attack Sister   . Heart disease Sister    History  Substance Use Topics  . Smoking status: Former Smoker -- 1.50 packs/day for 46 years    Quit date: 07/01/2012  . Smokeless tobacco: Former NeurosurgeonUser    Types: Chew  . Alcohol Use: No  Comment: occ  quit august 2013    Review of Systems  Constitutional: Positive for activity change, appetite change and fatigue. Negative for fever.  HENT: Negative for congestion and rhinorrhea.   Respiratory: Positive for cough, chest tightness and shortness of breath.   Cardiovascular: Positive for chest pain. Negative for leg swelling.  Gastrointestinal: Negative for nausea and abdominal distention.  Genitourinary: Negative for dysuria, hematuria and testicular pain.  Musculoskeletal: Negative for myalgias and arthralgias.  Skin: Negative for wound.  Neurological: Positive for weakness. Negative for dizziness, light-headedness and headaches.  A complete 10 system review of systems was  obtained and all systems are negative except as noted in the HPI and PMH.      Allergies  Review of patient's allergies indicates no known allergies.  Home Medications   Prior to Admission medications   Medication Sig Start Date End Date Taking? Authorizing Provider  albuterol (PROVENTIL HFA;VENTOLIN HFA) 108 (90 BASE) MCG/ACT inhaler Inhale 2 puffs into the lungs as needed. For shortness of breath or wheezing    Historical Provider, MD  albuterol (PROVENTIL) (2.5 MG/3ML) 0.083% nebulizer solution Take 2.5 mg by nebulization daily as needed. For shortness of breath or wheezing    Historical Provider, MD  aspirin EC 81 MG tablet Take 1 tablet (81 mg total) by mouth daily. 08/08/12   Jessica A Hope, PA-C  bisoprolol (ZEBETA) 5 MG tablet Take 0.5 tablets (2.5 mg total) by mouth daily. 08/08/12 08/08/13  Jessica A Hope, PA-C  ferrous sulfate 325 (65 FE) MG tablet Take 1 tablet (325 mg total) by mouth 2 (two) times daily with a meal. 08/08/12 08/08/13  Jessica A Hope, PA-C  furosemide (LASIX) 40 MG tablet Take 1 tablet (40 mg total) by mouth daily. 07/27/13   Dolores Patty, MD  levofloxacin (LEVAQUIN) 500 MG tablet Take 1 tablet (500 mg total) by mouth daily. 04/06/14   Gerhard Munch, MD  losartan (COZAAR) 100 MG tablet Take 1 tablet (100 mg total) by mouth daily. 09/16/12 09/16/13  Amy D Clegg, NP  spironolactone (ALDACTONE) 25 MG tablet TAKE 1 TABLET (25 MG TOTAL) BY MOUTH DAILY. 04/10/13   Dolores Patty, MD   BP 86/69 mmHg  Pulse 129  Temp(Src) 98.1 F (36.7 C) (Oral)  Resp 24  Ht 6' (1.829 m)  Wt 135 lb (61.236 kg)  BMI 18.31 kg/m2  SpO2 93% Physical Exam  Constitutional: He is oriented to person, place, and time. He appears distressed.  Disheveled, older than stated age, in respiratory distress  HENT:  Head: Normocephalic and atraumatic.  Mouth/Throat: Oropharynx is clear and moist. No oropharyngeal exudate.  Eyes: Conjunctivae and EOM are normal. Pupils are equal, round, and  reactive to light.  Neck: Normal range of motion. Neck supple.  No meningismus.  Cardiovascular: Normal rate, regular rhythm, normal heart sounds and intact distal pulses.   No murmur heard. Pulmonary/Chest: He is in respiratory distress. He has wheezes.  Increased work of breathing with scattered wheezing and rhonchi  Abdominal: Soft. There is no tenderness. There is no rebound and no guarding.  Musculoskeletal: Normal range of motion. He exhibits no edema or tenderness.  Neurological: He is alert and oriented to person, place, and time. No cranial nerve deficit. He exhibits normal muscle tone. Coordination normal.  No ataxia on finger to nose bilaterally. No pronator drift. 5/5 strength throughout. CN 2-12 intact. Equal grip strength. Sensation intact.   Skin: Skin is warm.  Psychiatric: He has a normal mood and affect.  His behavior is normal.  Nursing note and vitals reviewed.   ED Course  Procedures (including critical care time) Labs Review Labs Reviewed  CULTURE, BLOOD (ROUTINE X 2)  CULTURE, BLOOD (ROUTINE X 2)  TROPONIN I  CBC WITH DIFFERENTIAL/PLATELET  COMPREHENSIVE METABOLIC PANEL  BLOOD GAS, ARTERIAL  PROTIME-INR  LIPASE, BLOOD  D-DIMER, QUANTITATIVE (NOT AT [facility])  BRAIN NATRIURETIC PEPTIDE  URINALYSIS, ROUTINE W REFLEX MICROSCOPIC (NOT AT [facility])  I-STAT CG4 LACTIC ACID, ED  I-STAT CG4 LACTIC ACID, ED  I-STAT TROPOININ, ED    Imaging Review No results found.   EKG Interpretation   Date/Time:  Sunday June 03 2015 14:53:32 EDT Ventricular Rate:  130 PR Interval:  150 QRS Duration: 98 QT Interval:  336 QTC Calculation: 494 R Axis:   48 Text Interpretation:  Sinus tachycardia with frequent Premature  ventricular complexes Septal infarct , age undetermined ST \\T \ T wave  abnormality, consider lateral ischemia Abnormal ECG Artifact PVCs  Confirmed by Manus Gunning  MD, Trase Bunda ([id number]) on 06/03/2015 3:15:01 PM      MDM   Final diagnoses:  None   Respiratory  distress with cough, fatigue patient has tachypnea, hypoxic, hypotensive and tachycardic on arrival. Code sepsis activated.  Concern for possible PE as well. Bedside ultrasound does not show any right heart strain.  Last echocardiogram showed patient's EF was 45% 2013. He has not seen a doctor since then. Patient given IV fluids, IV antibiotics  Blood pressure has responded to IV fluids. 106/52. Patient feels improved after nebulizers and steroids. He is given IV antibiotics.  There is moderate concern for pulmonary embolism. Unable to CT due to acute renal failure. We'll start empiric heparin gtt. Patient has unknown EF. He is fluid responsive and appears dry on exam with hyperventilation. ABG does not show any CO2 retention.  Patient having episodes of ventricular tachycardia 4-5 beats at a time. He started on 2 g of magnesium.  Discussed with Dr. Dema Severin of critical care who thinks patient is appropriate for hospitalist about admission.  D/w Dr. Karilyn Cota who will admit patient and arrange for transfer to [facility] stepdown.   Patient remains tachypneic and tachycardic in ED but BP has stabilized in 90-100 systolic. He is comfortable on nasal cannula and appears stable for transfer.     EMERGENCY DEPARTMENT [place] CARDIAC EXAM "Study: Limited Ultrasound of the heart and pericardium"  INDICATIONS:Unstable Vital Signs and Dyspnea Multiple views of the heart and pericardium are obtained with a multi-frequency probe.  PERFORMED UE:[id number]  IMAGES ARCHIVED?: Yes  FINDINGS: No pericardial effusion, Decreased contractility and Tamponade physiology absent  LIMITATIONS:  Body habitus and Emergent procedure  VIEWS USED: Subcostal 4 chamber and Parasternal long axis  INTERPRETATION: Cardiac activity present, Pericardial effusioin absent, Cardiac tamponade absent and Decreased contractility  COMMENT:  No R Heart strain  CRITICAL CARE Performed by: Glynn Octave Total critical care time:  60 Critical care time was exclusive of separately billable procedures and treating other patients. Critical care was necessary to treat or prevent imminent or life-threatening deterioration. Critical care was time spent personally by me on the following activities: development of treatment plan with patient and/or surrogate as well as nursing, discussions with consultants, evaluation of patient's response to treatment, examination of patient, obtaining history from patient or surrogate, ordering and performing treatments and interventions, ordering and review of laboratory studies, ordering and review of radiographic studies, pulse oximetry and re-evaluation of patient's condition.   Glynn Octave, MD 06/04/15 [phone]

## 2015-06-03 NOTE — Progress Notes (Signed)
Per ABG results that were reviewed by Dr.Rancour,  BiPAP not initaited at this time.

## 2015-06-03 NOTE — H&P (Addendum)
[facility] History and Physical  EARL LOSEE ZOX:[id number] DOB: 01/19/1951 DOA: 06/03/2015  Referring physician: ER PCP: No PCP Per Patient   Chief Complaint: Dyspnea, cough  HPI: KELAN PRITT is a 64 y.o. male  This is a 64 year old man who has not seen a physician for almost 3 years. He has a history of CABG and when he was last seen his ejection fraction had improved. He is on no medications. He is a smoker. He has COPD. He gives a 2 to three-day history of increasing dyspnea, cough productive of yellow sputum with blood streaks and also hot/cold feelings indicative of fever perhaps. He has had chest pain with coughing. Evaluation in the emergency room shows him to be in acute renal failure, hypotensive and he appears to have a pneumonia on the left lung. He is now admitted for further management.   Review of Systems:  Apart from symptoms above, all systems negative.  Past Medical History  Diagnosis Date  . COPD (chronic obstructive pulmonary disease)   . Systolic CHF 07/15/2012    Echo 08/03/12 EF 25%, diffuse hypokinesis, trivial MR, mild R/LAE, mod TR, PASP  . Coronary artery disease 07/15/2012    S/P 4v CABG 07/23/12  . 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 Ischemic cardiomyopathy     EF 25%, LifeVest placed 08/08/12  . History of tobacco abuse     quit 06/2012  . History of ETOH abuse     quit 06/2012  . Hyponatremia 07/2012  . Iron deficiency anemia 07/2012  . Elevated LFTs 07/2012  . HLD (hyperlipidemia)    Past Surgical History  Procedure Laterality Date  . Tee without cardioversion  07/19/2012    Procedure: TRANSESOPHAGEAL ECHOCARDIOGRAM (TEE);  Surgeon: Pricilla Riffle, MD;  Location: [facility] ENDOSCOPY;  Service: Cardiovascular;  Laterality: N/A;  . Multiple extractions  with alveoloplasty  07/20/2012    Procedure: MULTIPLE EXTRACION WITH ALVEOLOPLASTY;  Surgeon: Charlynne Pander, DDS;  Location: [facility] OR;  Service: Oral Surgery;  Laterality: N/A;  Extraction of tooth #'s 2,3,4,5,6,7,8,9,10,11, 17,19, 20, 21,22,27,28,29 with alveoloplasty  . Coronary artery bypass graft  07/23/2012    Procedure: CORONARY ARTERY BYPASS GRAFTING (CABG);  Surgeon: Purcell Nails, MD;  Location[facility];  Service: Open Heart Surgery;  Laterality: N/A;  . Mitral valve repair  07/23/2012    Procedure: MITRAL VALVE REPAIR (MVR);  Surgeon: Purcell Nails, MD;  Location: [facility] OR;  Service: Open Heart Surgery;  Laterality: N/A;  . Mouth surgery    . Clipping left atrial appendage  07/23/2012   Social History:  reports that he quit smoking about 2 years ago. He has quit using smokeless tobacco. His smokeless tobacco use included Chew. He reports that he does not drink alcohol or use illicit drugs.  No Known Allergies  Family History  Problem Relation Age of Onset  . Heart disease Mother   . Heart attack Mother   . Heart failure Mother   . Heart attack Sister   . Heart disease Sister     Prior to Admission medications   Medication Sig Start Date End Date Taking? Authorizing Provider  Chlorphen-Pseudoephed-APAP (THERAFLU FLU/COLD PO) Take 15 mLs by mouth daily as needed (for cough).  Yes Historical Provider, MD  ibuprofen (ADVIL) 200 MG tablet Take 400 mg by mouth every 6 (six) hours as needed for mild pain or moderate pain.   Yes Historical Provider, MD  albuterol (PROVENTIL HFA;VENTOLIN HFA) 108 (90 BASE) MCG/ACT inhaler Inhale 2 puffs into the lungs as needed. For shortness of breath or wheezing    Historical Provider, MD  albuterol (PROVENTIL) (2.5 MG/3ML) 0.083% nebulizer solution Take 2.5 mg by nebulization daily as needed. For shortness of breath or wheezing    Historical Provider, MD   Physical Exam: Filed Vitals:   06/03/15 1545 06/03/15 1553 06/03/15 1600 06/03/15 1649  BP:  91/58 91/58 106/52   Pulse: 125 131 63   Temp:  100.7 F (38.2 C)    TempSrc:  Rectal    Resp: 36 22 32   Height:      Weight:      SpO2: 95% 91% 94% 94%    Wt Readings from Last 3 Encounters:  06/03/15 61.236 kg (135 lb)  04/06/14 70.398 kg (155 lb 3.2 oz)  11/15/12 75.751 kg (167 lb)    General:  Appears clinically dehydrated. He is alert and orientated despite his relative hypotension. He is saturating adequately on nasal cannula oxygen. Eyes: PERRL, normal lids, irises & conjunctiva ENT: grossly normal hearing, lips & tongue Neck: no LAD, masses or thyromegaly Cardiovascular: RRR, no m/r/g. No LE edema. Telemetry: Sinus rhythm with PVCs. Respiratory: Reduced air entry on the left mid and lower zones. There is no crackles, wheezing or bronchial breathing. Abdomen: soft, ntnd Skin: no rash or induration seen on limited exam Musculoskeletal: grossly normal tone BUE/BLE Psychiatric: grossly normal mood and affect, speech fluent and appropriate Neurologic: grossly non-focal.          Labs on Admission:  Basic Metabolic Panel:  Recent Labs Lab 06/03/15 1515 06/03/15 1556  NA 132*  --   K 3.4*  --   CL 98*  --   CO2 19*  --   GLUCOSE 130*  --   BUN 46*  --   CREATININE 2.72*  --   CALCIUM 8.7*  --   MG  --  1.8   Liver Function Tests:  Recent Labs Lab 06/03/15 1515  AST 45*  ALT 29  ALKPHOS 32*  BILITOT 2.1*  PROT 7.7  ALBUMIN 2.8*    Recent Labs Lab 06/03/15 1515  LIPASE 17*   No results for input(s): AMMONIA in the last 168 hours. CBC:  Recent Labs Lab 06/03/15 1515  WBC 8.9  NEUTROABS 8.2*  HGB 13.3  HCT 39.5  MCV 87.0  PLT 129*   Cardiac Enzymes:  Recent Labs Lab 06/03/15 1515  TROPONINI <0.03    BNP (last 3 results)  Recent Labs  06/03/15 1515  BNP 551.0*    ProBNP (last 3 results) No results for input(s): PROBNP in the last 8760 hours.  CBG: No results for input(s): GLUCAP in the last 168 hours.  Radiological  Exams on Admission: Dg Chest Portable 1 View  06/03/2015   CLINICAL DATA:  Patient with shortness of breath since Friday. Prior history of pneumonia.  EXAM: PORTABLE CHEST - 1 VIEW  COMPARISON:  Chest radiograph 04/06/2014.  FINDINGS: Stable enlarged cardiac and mediastinal contours. Consolidative opacities within the left mid and lower lung. No pleural effusion or pneumothorax.  IMPRESSION: Left mid lung consolidative opacities, concerning for pneumonia. Followup PA and lateral chest X-ray is recommended in 3-4 weeks following trial of antibiotic therapy to ensure resolution  and exclude underlying malignancy.   Electronically Signed   By: Annia Beltrew  Davis M.D.   On: 06/03/2015 15:30    EKG: Independently reviewed. Sinus rhythm without any acute ST-T wave changes. PVCs.  Assessment/Plan   1. Community-acquired pneumonia with sepsis. He is hypotensive, appears to have extensive pneumonia on the left lung on chest x-ray. His lactic acid is increased. He will be treated aggressively with intravenous fluids, rod spectrum antibiotics. 2. Acute renal failure. This is a reflection of hypotension/poor by mouth intake and sepsis. He'll be aggressively treated with intravenous fluids. 3. Coagulopathy. His INR is already increased and this is a worrisome feature. 4. Status post CABG. He appears to be stable and not in heart failure but the ER physician noted that there were several episodes of V. tach. 5. Possibility of pulmonary embolism. I'm not convinced that the patient has this diagnosis. The d-dimer elevation likely represents sepsis. A VQ scan could be ordered if this is considered. 6. Episodes of ventricular tachycardia. He will need cardiology consultation.  Since he appears to be developing multiorgan failure with sepsis, and he appears to have episodes of ventricular tachycardia, he'll be [facility].   Code Status: Full code.  DVT Prophylaxis:  SCDs.  Family Communication: I discussed the plan with the patient at the bedside.  Disposition Plan: Home when medically stable.  Time spent: 60 minutes.  Wilson SingerGOSRANI,NIMISH C [facility] Pager [phone].

## 2015-06-03 NOTE — ED Notes (Addendum)
Patient reports becoming "sick on Friday afternoon," in which he went home and slept in bed from Friday 1pm till Saturday 1PM. Per patient shortness of breath with cough and fatigue. Patient reports blood in sputum. Per patient chest hurts whe he coughs. Hx of CHF and has had tripe bypass surgery with valve replacement. Denies any fevers.

## 2015-06-04 ENCOUNTER — Encounter (HOSPITAL_COMMUNITY): Payer: Self-pay

## 2015-06-04 ENCOUNTER — Inpatient Hospital Stay (HOSPITAL_COMMUNITY): Payer: BLUE CROSS/BLUE SHIELD

## 2015-06-04 DIAGNOSIS — R0602 Shortness of breath: Secondary | ICD-10-CM

## 2015-06-04 DIAGNOSIS — A403 Sepsis due to Streptococcus pneumoniae: Secondary | ICD-10-CM

## 2015-06-04 LAB — HEPARIN LEVEL (UNFRACTIONATED): Heparin Unfractionated: <0.1 IU/mL — ABNORMAL LOW (ref 0.30–0.70)

## 2015-06-04 LAB — URINE MICROSCOPIC-ADD ON

## 2015-06-04 LAB — COMPREHENSIVE METABOLIC PANEL
ALBUMIN: 2 g/dL — AB (ref 3.5–5.0)
ALK PHOS: 24 U/L — AB (ref 38–126)
ALT: 24 U/L (ref 17–63)
AST: 31 U/L (ref 15–41)
Anion gap: 6 (ref 5–15)
BUN: 47 mg/dL — AB (ref 6–20)
CO2: 21 mmol/L — ABNORMAL LOW (ref 22–32)
CREATININE: 1.41 mg/dL — AB (ref 0.61–1.24)
Calcium: 8.4 mg/dL — ABNORMAL LOW (ref 8.9–10.3)
Chloride: 105 mmol/L (ref 101–111)
GFR calc Af Amer: >60 mL/min (ref >60)
GFR, EST NON AFRICAN AMERICAN: 52 mL/min — AB (ref >60)
Glucose, Bld: 224 mg/dL — ABNORMAL HIGH (ref 65–99)
Potassium: 3.4 mmol/L — ABNORMAL LOW (ref 3.5–5.1)
SODIUM: 132 mmol/L — AB (ref 135–145)
Total Bilirubin: 1.1 mg/dL (ref 0.3–1.2)
Total Protein: 6.5 g/dL (ref 6.5–8.1)

## 2015-06-04 LAB — LEGIONELLA ANTIGEN, URINE

## 2015-06-04 LAB — CBC
HEMATOCRIT: 33.8 % — AB (ref 39.0–52.0)
Hemoglobin: 11.3 g/dL — ABNORMAL LOW (ref 13.0–17.0)
MCH: 28.7 pg (ref 26.0–34.0)
MCHC: 33.4 g/dL (ref 30.0–36.0)
MCV: 85.8 fL (ref 78.0–100.0)
Platelets: 122 10*3/uL — ABNORMAL LOW (ref 150–400)
RBC: 3.94 MIL/uL — ABNORMAL LOW (ref 4.22–5.81)
RDW: 17.3 % — ABNORMAL HIGH (ref 11.5–15.5)
WBC: 7.8 10*3/uL (ref 4.0–10.5)

## 2015-06-04 LAB — URINALYSIS, ROUTINE W REFLEX MICROSCOPIC
Bilirubin Urine: NEGATIVE
GLUCOSE, UA: 250 mg/dL — AB
Hgb urine dipstick: NEGATIVE
KETONES UR: 15 mg/dL — AB
Leukocytes, UA: NEGATIVE
NITRITE: NEGATIVE
PH: 5 (ref 5.0–8.0)
PROTEIN: 30 mg/dL — AB
SPECIFIC GRAVITY, URINE: 1.017 (ref 1.005–1.030)
Urobilinogen, UA: 0.2 mg/dL (ref 0.0–1.0)

## 2015-06-04 LAB — APTT: APTT: 36 s (ref 24–37)

## 2015-06-04 LAB — MRSA PCR SCREENING: MRSA by PCR: NEGATIVE

## 2015-06-04 LAB — LACTIC ACID, PLASMA
LACTIC ACID, VENOUS: 2.2 mmol/L — AB (ref 0.5–2.0)
Lactic Acid, Venous: 1.9 mmol/L (ref 0.5–2.0)

## 2015-06-04 LAB — CLOSTRIDIUM DIFFICILE BY PCR: Toxigenic C. Difficile by PCR: NEGATIVE

## 2015-06-04 LAB — STREP PNEUMONIAE URINARY ANTIGEN: Strep Pneumo Urinary Antigen: POSITIVE — AB

## 2015-06-04 LAB — PROTIME-INR
INR: 1.56 — ABNORMAL HIGH (ref 0.00–1.49)
PROTHROMBIN TIME: 18.8 s — AB (ref 11.6–15.2)

## 2015-06-04 LAB — HIV ANTIBODY (ROUTINE TESTING W REFLEX): HIV Screen 4th Generation wRfx: NONREACTIVE

## 2015-06-04 MED ORDER — SODIUM CHLORIDE 0.9 % IV SOLN
INTRAVENOUS | Status: DC
  Administered 2015-06-04 – 2015-06-06 (×3): via INTRAVENOUS

## 2015-06-04 MED ORDER — HEPARIN SODIUM (PORCINE) 5000 UNIT/ML IJ SOLN
5000.0000 [IU] | Freq: Three times a day (TID) | INTRAMUSCULAR | Status: DC
  Administered 2015-06-04 – 2015-06-06 (×7): 5000 [IU] via SUBCUTANEOUS
  Filled 2015-06-04 (×8): qty 1

## 2015-06-04 MED ORDER — HEPARIN (PORCINE) IN NACL 100-0.45 UNIT/ML-% IJ SOLN
1300.0000 [IU]/h | INTRAMUSCULAR | Status: DC
  Administered 2015-06-04: 1300 [IU]/h via INTRAVENOUS
  Filled 2015-06-04: qty 250

## 2015-06-04 MED ORDER — ASPIRIN EC 81 MG PO TBEC
81.0000 mg | DELAYED_RELEASE_TABLET | Freq: Every day | ORAL | Status: DC
  Administered 2015-06-04 – 2015-06-09 (×6): 81 mg via ORAL
  Filled 2015-06-04 (×6): qty 1

## 2015-06-04 MED ORDER — TECHNETIUM TO 99M ALBUMIN AGGREGATED
5.6900 | Freq: Once | INTRAVENOUS | Status: AC | PRN
  Administered 2015-06-04: 6 via INTRAVENOUS

## 2015-06-04 MED ORDER — BISOPROLOL FUMARATE 5 MG PO TABS
2.5000 mg | ORAL_TABLET | Freq: Every day | ORAL | Status: DC
  Administered 2015-06-04 – 2015-06-05 (×2): 2.5 mg via ORAL
  Filled 2015-06-04 (×2): qty 0.5

## 2015-06-04 MED ORDER — TECHNETIUM TC 99M DIETHYLENETRIAME-PENTAACETIC ACID: 36.9000 | Freq: Once | INTRAVENOUS | Status: DC | PRN

## 2015-06-04 MED ORDER — HEPARIN BOLUS VIA INFUSION
2000.0000 [IU] | Freq: Once | INTRAVENOUS | Status: AC
  Administered 2015-06-04: 2000 [IU] via INTRAVENOUS
  Filled 2015-06-04: qty 2000

## 2015-06-04 MED ORDER — VANCOMYCIN HCL IN DEXTROSE 750-5 MG/150ML-% IV SOLN
750.0000 mg | Freq: Two times a day (BID) | INTRAVENOUS | Status: DC
  Administered 2015-06-04 – 2015-06-06 (×5): 750 mg via INTRAVENOUS
  Filled 2015-06-04 (×6): qty 150

## 2015-06-04 MED ORDER — POTASSIUM CHLORIDE CRYS ER 20 MEQ PO TBCR
40.0000 meq | EXTENDED_RELEASE_TABLET | Freq: Two times a day (BID) | ORAL | Status: AC
  Administered 2015-06-04 (×2): 40 meq via ORAL
  Filled 2015-06-04 (×2): qty 2

## 2015-06-04 NOTE — Progress Notes (Signed)
Notified K. Shorr about order to d/c Heparin, was instructed to leave Heparin gtt infusing at current rate until we get the results of the VQ Scan. Heparin infusing at 1000 units.hr. Patient on his way to VQ Scan. Will continue to monitor closely.

## 2015-06-04 NOTE — Progress Notes (Signed)
ANTICOAGULATION CONSULT NOTE - Initial Consult  Pharmacy Consult for Heparin Indication: possible PE  No Known Allergies  Patient Measurements: Height: 6' (182.9 cm) Weight: 166 lb 14.2 oz (75.7 kg) IBW/kg (Calculated) : 77.6  Vital Signs: Temp: 97.8 F (36.6 C) (07/11 0412) Temp Source: Oral (07/11 0412) BP: 101/73 mmHg (07/10 2100) Pulse Rate: 123 (07/11 0400)  Labs:  Recent Labs  06/03/15 1515 06/04/15 0552  HGB 13.3 11.3*  HCT 39.5 33.8*  PLT 129* 122*  APTT  --  36  LABPROT 19.3* 18.8*  INR 1.63* 1.56*  HEPARINUNFRC  --  <0.10*  CREATININE 2.72*  --   TROPONINI <0.03  --     Estimated Creatinine Clearance: 30.2 mL/min (by C-G formula based on Cr of 2.72).  Medications:  Prescriptions prior to admission  Medication Sig Dispense Refill Last Dose  . Chlorphen-Pseudoephed-APAP (THERAFLU FLU/COLD PO) Take 15 mLs by mouth daily as needed (for cough).   06/03/2015 at Unknown time  . ibuprofen (ADVIL) 200 MG tablet Take 400 mg by mouth every 6 (six) hours as needed for mild pain or moderate pain.   06/03/2015 at Unknown time  . albuterol (PROVENTIL HFA;VENTOLIN HFA) 108 (90 BASE) MCG/ACT inhaler Inhale 2 puffs into the lungs as needed. For shortness of breath or wheezing   Taking  . albuterol (PROVENTIL) (2.5 MG/3ML) 0.083% nebulizer solution Take 2.5 mg by nebulization daily as needed. For shortness of breath or wheezing   Taking    Assessment: 64 y.o. male with SOB/possible PE (intermediate probability on VQ scan) for heparin.  Heparin 4000 units IV bolus, 1000 units/hr [facility], and [facility]   Goal of Therapy:  Heparin level 0.3-0.7 units/ml Monitor platelets by anticoagulation protocol: Yes   Plan:  Heparin 2000 units IV bolus, then increase heparin 1300 units/hr Check heparin level in 6 hours.   Eddie Candlebbott, Kazi Montoro Vernon 06/04/2015,6:32 AM

## 2015-06-04 NOTE — Progress Notes (Signed)
Patient had 3 loose stools tonight, so sample sent to lab to rule out C-Diff. Patient placed on precautions until sample is ruled out. Monitoring closely.

## 2015-06-04 NOTE — Care Management Note (Signed)
Case Management Note  Patient Details  Name: Miguel MainlandCharles E Cuevas MRN: [id number] Date of Birth: 1951-08-19  Subjective/Objective:           Adm w sepsis         Action/Plan: lives alone   Expected Discharge Date:                  Expected Discharge Plan:     In-House Referral:     Discharge planning Services     Post Acute Care Choice:    Choice offered to:     DME Arranged:    DME Agency:     HH Arranged:    HH Agency:     Status of Service:     Medicare Important Message Given:    Date Medicare IM Given:    Medicare IM give by:    Date Additional Medicare IM Given:    Additional Medicare Important Message give by:     If discussed at Long Length of Stay Meetings, dates discussed:    Additional Comments: ur review done  Hanley HaysDowell, [place] Elling T, RN 06/04/2015, 10:52 AM

## 2015-06-04 NOTE — Progress Notes (Addendum)
Miguel York  Miguel York ZOX:[id number] DOB: Mar 26, 1951 DOA: 06/03/2015 PCP: No PCP Per Patient  Admit HPI / Brief Narrative: 64 y.o. male former smoker w/ a hx of CABG and COPD who had not seen a physician for almost 3 years who presented with a 3 day history of increasing dyspnea, cough productive of yellow sputum with blood streaks, subjective fever, and chest pain with coughing.  Evaluation in the emergency room shows him to be in acute renal failure, and hypotensive with an apparent pneumonia in the left lung.   HPI/Subjective: The patient is resting comfortably in bed.  He states he feels much better than he did at his presentation.  He complains of left lower lobe pleuritic type chest pain but denies other chest pain.  He denies shortness of breath when at rest.  He denies nausea vomiting or headache at this time.  There is no abdominal pain.  He has not noticed any swelling of his lower extremities.  Assessment/Plan:  Severe Sepsis (lactic acidosis) due to LLL Strep pneum CAP Lactate improving - BP stable - tachycardia improving - cont volume resuscitation - cont broad spectrum abx until sensitivities confirmed   Gram + cocci in blood cx MRSA screen negative - Strep pneumo urinary screen + - presumed Streptococcal pneumoniae bacteremia related to pneumonia  Acute renal failure crt 2.72 at presentation - recheck this a.m. with volume resuscitation  Ventricular Tachycardia  Likely due to acute stress and electrolyte abnormalities - check TTE - follow on telemetry - maximize electrolytes - attempt to resume low dose BB (on Zebeta previously) as BP will allow   Hyponatremia  Likely due to hypovolemia - follow-up with volume resuscitation  Hypokalemia  Likely due to poor intake in setting of acute illness - supplement and follow  Coagulopathy INR 1.63 at presentation - likely due to an element of shock liver - follow  coags  Normocytic anemia  Check anemia panel  Elevated d-dimer Likely due to severe pneumonia with sepsis - clinical suspicion of pulmonary embolism low - discontinue IV heparin in setting of coagulopathy - check lower extremities venous duplex to rule out DVT but physical exam not convincing  Diarrhea  C diff negative (low risk)   COPD - ongoing tobacco abuse Patient admits that he is still smoking - I have counseled him on the absolute need to discontinue smoking entirely and permanently and educated him on the directly between ongoing tobacco abuse and COPD, lung cancer, and vascular disease/heart attack  Hx of systolic CHF - EF 25% Sept 2013 EF had improved to 45-50% by Nov 2013 - reassess systolic fxn - no ACE/ARB now due to ARF  CAD s/p 4V CABG Denies anginal type chest pain  S/P mitral valve repair  TTE to reevaluate valve  Code Status: FULL Family Communication: no family present at time of exam Disposition Plan: SDU  Consultants: none  Procedures: none  Antibiotics: Zosyn 7/10 > Vanc 7/10 >  DVT prophylaxis: Subcutaneous heparin  Objective: Blood pressure 125/71, pulse 100, temperature 97.5 F (36.4 C), temperature source Oral, resp. rate 28, height 6' (1.829 m), weight 75.7 kg (166 lb 14.2 oz), SpO2 99 %.  Intake/Output Summary (Last 24 hours) at 06/04/15 0915 Last data filed at 06/04/15 0800  Gross per 24 hour  Intake  248.2 ml  Output    250 ml  Net   -1.8 ml   Exam: General: No acute respiratory distress at rest in  bed Lungs: Coarse crackles left base - no wheeze - good air movement throughout other fields Cardiovascular: Frequent ectopic beats/irregular - rate approximately 90 - midsystolic 2/6 low pitched murmur Abdomen: Nontender, nondistended, soft, bowel sounds positive, no rebound, no ascites, no appreciable mass Extremities: No significant cyanosis, clubbing, or edema bilateral lower extremities  Data Reviewed: Basic Metabolic  Panel:  Recent Labs Lab 06/03/15 1515 06/03/15 1556  NA 132*  --   K 3.4*  --   CL 98*  --   CO2 19*  --   GLUCOSE 130*  --   BUN 46*  --   CREATININE 2.72*  --   CALCIUM 8.7*  --   MG  --  1.8    CBC:  Recent Labs Lab 06/03/15 1515 06/04/15 0552  WBC 8.9 7.8  NEUTROABS 8.2*  --   HGB 13.3 11.3*  HCT 39.5 33.8*  MCV 87.0 85.8  PLT 129* 122*   Liver Function Tests:  Recent Labs Lab 06/03/15 1515  AST 45*  ALT 29  ALKPHOS 32*  BILITOT 2.1*  PROT 7.7  ALBUMIN 2.8*    Recent Labs Lab 06/03/15 1515  LIPASE 17*   Coags:  Recent Labs Lab 06/03/15 1515 06/04/15 0552  INR 1.63* 1.56*    Recent Labs Lab 06/04/15 0552  APTT 36    Cardiac Enzymes:  Recent Labs Lab 06/03/15 1515  TROPONINI <0.03    Recent Results (from the past 240 hour(s))  Blood culture (routine x 2)     Status: None (Preliminary result)   Collection Time: 06/03/15  3:28 PM  Result Value Ref Range Status   Specimen Description BLOOD RIGHT HAND  Final   Special Requests   Final    BOTTLES DRAWN AEROBIC AND ANAEROBIC AEB 8CC ANA 8CC   Culture   Final    GRAM POSITIVE COCCI IN PAIRS Gram Stain Report Called to,Read Back By and Verified With: WOLF C. AT [place] ON [id number] AT 0844A BY THOMPSON S.    Report Status PENDING  Incomplete  MRSA PCR Screening     Status: None   Collection Time: 06/03/15  9:40 PM  Result Value Ref Range Status   MRSA by PCR NEGATIVE NEGATIVE Final    Comment:        The GeneXpert MRSA Assay (FDA approved for NASAL specimens only), is one component of a comprehensive MRSA colonization surveillance program. It is not intended to diagnose MRSA infection nor to guide or monitor treatment for MRSA infections.   Clostridium Difficile by PCR (not at [facility])     Status: None   Collection Time: 06/04/15  1:46 AM  Result Value Ref Range Status   C difficile by pcr NEGATIVE NEGATIVE Final     Studies:   Recent x-ray studies have been reviewed in detail  by the Attending Physician  Scheduled Meds:  Scheduled Meds: . sodium chloride   Intravenous Once  . folic acid  1 mg Oral Daily  . multivitamin with minerals  1 tablet Oral Daily  . piperacillin-tazobactam (ZOSYN)  IV  3.375 g Intravenous Q8H  . sodium chloride  1,000 mL Intravenous Once  . thiamine  100 mg Oral Daily  . vancomycin  750 mg Intravenous Q24H    Time spent on care of this patient: 35 mins   Toniya Rozar T , MD   [facility] Office  [phone] Pager - Text Page per Loretha Stapler as per below:  On-Call/Text Page:      [email or web address]  password [email or web address]  If 7PM-7AM, please contact night-coverage [email or web address] Password [email or web address] 06/04/2015, 9:15 AM   LOS: 1 day     '

## 2015-06-04 NOTE — Progress Notes (Signed)
CRITICAL VALUE ALERT  Critical value received:  Lactic Acid 2.2  Date of notification:  06/04/2015   Time of notification:  1102  Critical value read back:Yes.    Nurse who received alert:  Malachi ProKristina Parilee Hally RN  MD notified (1st page):  Dr. Sharon SellerMcclung   Time of first page:  1120

## 2015-06-04 NOTE — Progress Notes (Signed)
CRITICAL VALUE ALERT  Critical value received: Positive blood cultures, Gram positive cocci in pairs, anaerobic bottle   Date of notification:  06/04/2015   Time of notification:  0844  Critical value read back:Yes.    Nurse who received alert:  Malachi ProKristina Mckay Brandt RN   MD notified (1st page):  Maretta BeesJ. Mcclung

## 2015-06-04 NOTE — Progress Notes (Signed)
VASCULAR LAB PRELIMINARY  PRELIMINARY  PRELIMINARY  PRELIMINARY  Bilateral lower extremity venous Dopplers completed.    Preliminary report:  There is no DVT or SVT noted in the bilateral lower extremities.   Dutch Ing, RVT 06/04/2015, 3:15 PM

## 2015-06-05 ENCOUNTER — Ambulatory Visit (HOSPITAL_COMMUNITY): Payer: BLUE CROSS/BLUE SHIELD

## 2015-06-05 DIAGNOSIS — E876 Hypokalemia: Secondary | ICD-10-CM | POA: Diagnosis present

## 2015-06-05 DIAGNOSIS — D689 Coagulation defect, unspecified: Secondary | ICD-10-CM | POA: Diagnosis present

## 2015-06-05 DIAGNOSIS — I472 Ventricular tachycardia, unspecified: Secondary | ICD-10-CM | POA: Diagnosis present

## 2015-06-05 DIAGNOSIS — I5022 Chronic systolic (congestive) heart failure: Secondary | ICD-10-CM | POA: Diagnosis present

## 2015-06-05 DIAGNOSIS — R7989 Other specified abnormal findings of blood chemistry: Secondary | ICD-10-CM | POA: Diagnosis present

## 2015-06-05 DIAGNOSIS — J189 Pneumonia, unspecified organism: Secondary | ICD-10-CM | POA: Diagnosis present

## 2015-06-05 DIAGNOSIS — R6520 Severe sepsis without septic shock: Secondary | ICD-10-CM

## 2015-06-05 DIAGNOSIS — R7881 Bacteremia: Secondary | ICD-10-CM | POA: Diagnosis present

## 2015-06-05 DIAGNOSIS — E871 Hypo-osmolality and hyponatremia: Secondary | ICD-10-CM | POA: Diagnosis present

## 2015-06-05 DIAGNOSIS — I348 Other nonrheumatic mitral valve disorders: Secondary | ICD-10-CM

## 2015-06-05 DIAGNOSIS — R791 Abnormal coagulation profile: Secondary | ICD-10-CM

## 2015-06-05 DIAGNOSIS — A419 Sepsis, unspecified organism: Secondary | ICD-10-CM | POA: Diagnosis present

## 2015-06-05 LAB — CBC
HCT: 33.6 % — ABNORMAL LOW (ref 39.0–52.0)
Hemoglobin: 11.3 g/dL — ABNORMAL LOW (ref 13.0–17.0)
MCH: 28.4 pg (ref 26.0–34.0)
MCHC: 33.6 g/dL (ref 30.0–36.0)
MCV: 84.4 fL (ref 78.0–100.0)
Platelets: 125 10*3/uL — ABNORMAL LOW (ref 150–400)
RBC: 3.98 MIL/uL — ABNORMAL LOW (ref 4.22–5.81)
RDW: 17.3 % — AB (ref 11.5–15.5)
WBC: 10.7 10*3/uL — ABNORMAL HIGH (ref 4.0–10.5)

## 2015-06-05 LAB — COMPREHENSIVE METABOLIC PANEL
ALK PHOS: 72 U/L (ref 38–126)
ALT: 24 U/L (ref 17–63)
AST: 38 U/L (ref 15–41)
Albumin: 1.9 g/dL — ABNORMAL LOW (ref 3.5–5.0)
Anion gap: 5 (ref 5–15)
BUN: 46 mg/dL — ABNORMAL HIGH (ref 6–20)
CHLORIDE: 109 mmol/L (ref 101–111)
CO2: 21 mmol/L — AB (ref 22–32)
CREATININE: 1.06 mg/dL (ref 0.61–1.24)
Calcium: 8.8 mg/dL — ABNORMAL LOW (ref 8.9–10.3)
GFR calc Af Amer: >60 mL/min (ref >60)
GFR calc non Af Amer: >60 mL/min (ref >60)
Glucose, Bld: 137 mg/dL — ABNORMAL HIGH (ref 65–99)
Potassium: 4.3 mmol/L (ref 3.5–5.1)
Sodium: 135 mmol/L (ref 135–145)
Total Bilirubin: 0.8 mg/dL (ref 0.3–1.2)
Total Protein: 6.5 g/dL (ref 6.5–8.1)

## 2015-06-05 LAB — URINE CULTURE: Culture: NO GROWTH

## 2015-06-05 LAB — APTT: aPTT: 30 seconds (ref 24–37)

## 2015-06-05 LAB — PROTIME-INR
INR: 1.37 (ref 0.00–1.49)
PROTHROMBIN TIME: 17 s — AB (ref 11.6–15.2)

## 2015-06-05 MED ORDER — CARVEDILOL 3.125 MG PO TABS
3.1250 mg | ORAL_TABLET | Freq: Two times a day (BID) | ORAL | Status: DC
  Administered 2015-06-05 – 2015-06-07 (×4): 3.125 mg via ORAL
  Filled 2015-06-05 (×6): qty 1

## 2015-06-05 MED ORDER — FOLIC ACID 1 MG PO TABS
1.0000 mg | ORAL_TABLET | Freq: Every day | ORAL | Status: DC
  Administered 2015-06-06 – 2015-06-09 (×4): 1 mg via ORAL
  Filled 2015-06-05 (×5): qty 1

## 2015-06-05 MED ORDER — IPRATROPIUM-ALBUTEROL 0.5-2.5 (3) MG/3ML IN SOLN
3.0000 mL | Freq: Four times a day (QID) | RESPIRATORY_TRACT | Status: DC
Start: 2015-06-05 — End: 2015-06-06
  Administered 2015-06-05 – 2015-06-06 (×4): 3 mL via RESPIRATORY_TRACT
  Filled 2015-06-05 (×4): qty 3

## 2015-06-05 MED ORDER — METHYLPREDNISOLONE SODIUM SUCC 125 MG IJ SOLR
60.0000 mg | INTRAMUSCULAR | Status: DC
  Administered 2015-06-05: 60 mg via INTRAVENOUS
  Filled 2015-06-05: qty 2
  Filled 2015-06-05: qty 0.96

## 2015-06-05 MED ORDER — DM-GUAIFENESIN ER 30-600 MG PO TB12
1.0000 | ORAL_TABLET | Freq: Two times a day (BID) | ORAL | Status: DC
  Administered 2015-06-05 – 2015-06-09 (×8): 1 via ORAL
  Filled 2015-06-05 (×9): qty 1

## 2015-06-05 NOTE — Progress Notes (Signed)
[facility] TEAM 1 - Stepdown/ICU TEAM Progress Note  Mardi MainlandCharles E Clarkston ZOX:[id number] DOB: 1950-12-17 DOA: 06/03/2015 PCP: No PCP Per Patient  Admit HPI / Brief Narrative: 64 y.o. WM PMHx Hx tobacco abuse, Hx alcohol abuse, Hx CABG 4V, S/P mitral valve repair, Chronic Systolic CHF, and COPD, iron deficiency anemia, HLD  Patient has not seen a physician for almost 3 years who presented with a 3 day history of increasing dyspnea, cough productive of yellow sputum with blood streaks, subjective fever, and chest pain with coughing.  Evaluation in the emergency room shows him to be in acute renal failure, and hypotensive with an apparent pneumonia in the left lung.    HPI/Subjective: 7/12 A/O 4, positive SOB, positive nonproductive cough, negative CP. States over the weekend was having positive diaphoresis, positive chills, positive hemoptysis. States last drink over the weekend.  Assessment/Plan: Severe Sepsis (lactic acidosis) due to LLL Strep pneum HCAP -Lactic acidosis resolved  -Continue Antibiotics for CAP -Prednisone 60 mg daily  -DuoNeb QID -Mucinex DM  BID -Flutter valve -Sputum culture pending  COPD/ongoing tobacco abuse -Smoking cessation counseled patient  Gram + cocci in blood cx/bacteremia -MRSA screen negative  - Strep pneumo urinary screen + - presumed Streptococcal pneumoniae bacteremia related to pneumonia  Acute renal failure(Cr= 2.72 at presentation) - WNL with resuscitation   Systolic CHF - EF 25% Sept 2013/Cardiomyopathy -Declining cardiac status LVEF now returned to 20-25%; see results below  -Start Coreg 3.125 mg BID -If patient's BP tolerates would add low-level Ace in a.m.  CAD s/p 4V CABG -See systolic CHF   S/P mitral valve repair  -Trivial regurgitation of repaired valve  Ventricular Tachycardia  -Resolved  -Likely due to acute stress and electrolyte abnormalities    Hyponatremia  Resolved  Hypokalemia  -Potassium goal>4 -Monitor  closely; also check magnesium level  Coagulopathy -likely due to an element of shock liver; INR has returned to normal.  Normocytic anemia  -Check anemia panel  Elevated d-dimer -Likely due to severe pneumonia with sepsis; suspicion of pulmonary embolism low  - discontinue IV heparin in setting of coagulopathy  -Lower extremity Dopplers negative for DVT/SVT  Diarrhea  -C diff negative (low risk)     Code Status: FULL Family Communication: no family present at time of exam Disposition Plan: Resolution pneumonia    Consultants: NA  Procedure/Significant Events: 7/11 bilateral lower extremity Dopplers; negative DVT/SVT 7/12 echocardiogram;- LVEF= 20% to 25%. Diffuse hypokinesis. - Left and Right atrium:  moderately dilated.     Culture 7/11 Legionella urine antigen negative 7/11 strep pneumo urine antigen positive 7/11 urine pending 7/11 C. difficile by PCR negative   Antibiotics: Zosyn 7/10 > Vanc 7/10 >  DVT prophylaxis: Subcutaneous heparin   Devices    LINES / TUBES:      Continuous Infusions: . sodium chloride Stopped (06/05/15 1505)    Objective: VITAL SIGNS: Temp: 97.7 F (36.5 C) (07/12 1600) Temp Source: Oral (07/12 1600) BP: 156/91 mmHg (07/12 1200) Pulse Rate: 91 (07/12 0700) SPO2; FIO2:   Intake/Output Summary (Last 24 hours) at 06/05/15 1712 Last data filed at 06/05/15 1505  Gross per 24 hour  Intake 1700.42 ml  Output   1700 ml  Net   0.42 ml     Exam: General: A/O 4, NAD, No acute respiratory distress Eyes: Negative headache, eye pain, double vision, negative scleral hemorrhage ENT: Negative Runny nose, negative ear pain, negative tinnitus, negative gingival bleeding Neck:  Negative scars, masses, torticollis, lymphadenopathy, JVD Lungs: diffuse  poor air movement, diffuse expiratory wheezing, diffuse rhonchi.  Cardiovascular: Regular rate and rhythm without murmur gallop or rub normal S1 and S2 Abdomen:negative  abdominal pain, negative dysphagia, Nontender, nondistended, soft, bowel sounds positive, no rebound, no ascites, no appreciable mass Extremities: No significant cyanosis, clubbing, or edema bilateral lower extremities Psychiatric:  Negative depression, negative anxiety, negative fatigue, negative mania  Neurologic:  Cranial nerves II through XII intact, tongue/uvula midline, all extremities muscle strength 5/5, sensation intact throughout, negative dysarthria, negative expressive aphasia, negative receptive aphasia.    Data Reviewed: Basic Metabolic Panel:  Recent Labs Lab 06/03/15 1515 06/03/15 1556 06/04/15 1003 06/05/15 0225  NA 132*  --  132* 135  K 3.4*  --  3.4* 4.3  CL 98*  --  105 109  CO2 19*  --  21* 21*  GLUCOSE 130*  --  224* 137*  BUN 46*  --  47* 46*  CREATININE 2.72*  --  1.41* 1.06  CALCIUM 8.7*  --  8.4* 8.8*  MG  --  1.8  --   --    Liver Function Tests:  Recent Labs Lab 06/03/15 1515 06/04/15 1003 06/05/15 0225  AST 45* 31 38  ALT 29 24 24   ALKPHOS 32* 24* 72  BILITOT 2.1* 1.1 0.8  PROT 7.7 6.5 6.5  ALBUMIN 2.8* 2.0* 1.9*    Recent Labs Lab 06/03/15 1515  LIPASE 17*   No results for input(s): AMMONIA in the last 168 hours. CBC:  Recent Labs Lab 06/03/15 1515 06/04/15 0552 06/05/15 0225  WBC 8.9 7.8 10.7*  NEUTROABS 8.2*  --   --   HGB 13.3 11.3* 11.3*  HCT 39.5 33.8* 33.6*  MCV 87.0 85.8 84.4  PLT 129* 122* 125*   Cardiac Enzymes:  Recent Labs Lab 06/03/15 1515  TROPONINI <0.03   BNP (last 3 results)  Recent Labs  06/03/15 1515  BNP 551.0*    ProBNP (last 3 results) No results for input(s): PROBNP in the last 8760 hours.  CBG: No results for input(s): GLUCAP in the last 168 hours.  Recent Results (from the past 240 hour(s))  Blood culture (routine x 2)     Status: None (Preliminary result)   Collection Time: 06/03/15  3:21 PM  Result Value Ref Range Status   Specimen Description BLOOD LEFT WRIST  Final   Special  Requests   Final    BOTTLES DRAWN AEROBIC AND ANAEROBIC AEB 8CC ANA 6CC   Culture NO GROWTH 2 DAYS  Final   Report Status PENDING  Incomplete  Blood culture (routine x 2)     Status: None (Preliminary result)   Collection Time: 06/03/15  3:28 PM  Result Value Ref Range Status   Specimen Description BLOOD RIGHT HAND  Final   Special Requests   Final    BOTTLES DRAWN AEROBIC AND ANAEROBIC AEB 8CC ANA 8CC   Culture  Setup Time   Final    GRAM POSITIVE COCCI IN PAIRS Gram Stain Report Called to,Read Back By and Verified With: WOLF C. AT [place] AT [id number] AT 0844 BY THOMPSON S ANAEROBIC BOTTLE ONLY    Culture   Final    GRAM POSITIVE COCCI Performed at [facility]    Report Status PENDING  Incomplete  MRSA PCR Screening     Status: None   Collection Time: 06/03/15  9:40 PM  Result Value Ref Range Status   MRSA by PCR NEGATIVE NEGATIVE Final    Comment:  The GeneXpert MRSA Assay (FDA approved for NASAL specimens only), is one component of a comprehensive MRSA colonization surveillance program. It is not intended to diagnose MRSA infection nor to guide or monitor treatment for MRSA infections.   Urine culture     Status: None   Collection Time: 06/04/15  1:45 AM  Result Value Ref Range Status   Specimen Description URINE, RANDOM  Final   Special Requests NONE  Final   Culture NO GROWTH 1 DAY  Final   Report Status 06/05/2015 FINAL  Final  Clostridium Difficile by PCR (not at [facility])     Status: None   Collection Time: 06/04/15  1:46 AM  Result Value Ref Range Status   C difficile by pcr NEGATIVE NEGATIVE Final     Studies:  Recent x-ray studies have been reviewed in detail by the Attending Physician  Scheduled Meds:  Scheduled Meds: . aspirin EC  81 mg Oral Daily  . carvedilol  3.125 mg Oral BID WC  . dextromethorphan-guaiFENesin  1 tablet Oral BID  . folic acid  1 mg Oral Daily  . heparin subcutaneous  5,000 Units Subcutaneous 3 times per day  .  ipratropium-albuterol  3 mL Nebulization Q6H  . methylPREDNISolone (SOLU-MEDROL) injection  60 mg Intravenous Q24H  . multivitamin with minerals  1 tablet Oral Daily  . piperacillin-tazobactam (ZOSYN)  IV  3.375 g Intravenous Q8H  . thiamine  100 mg Oral Daily  . vancomycin  750 mg Intravenous Q12H    Time spent on care of this patient: 40 mins   Rubby Barbary, Roselind Messier , MD  [facility] Office  [phone] Pager [phone]  On-Call/Text Page:      [email or web address]      password [email or web address]  If 7PM-7AM, please contact night-coverage [email or web address] Password [email or web address] 06/05/2015, 5:12 PM   LOS: 2 days   Care during the described time interval was provided by me .  I have reviewed this patient's available data, including medical history, events of note, physical examination, and all test results as part of my evaluation. I have personally reviewed and interpreted all radiology studies.   Carolyne Littles, MD [phone] Pager

## 2015-06-05 NOTE — Progress Notes (Signed)
  Echocardiogram 2D Echocardiogram has been performed.  Miguel York 06/05/2015, 11:55 AM

## 2015-06-05 NOTE — Clinical Documentation Improvement (Signed)
Platelets as below.  Please identify any clinical conditions associated with the abnormal platelet values and document in your future progress notes and discharge summary.  Component      Platelets  Latest Ref Rng      150 - 400 K/uL  06/03/2015     3:15 PM 129 (L)  06/04/2015     5:52 AM 122 (L)  06/05/2015      125 (L)   Possible Clinical Conditions: -Thrombocytopenia -Other condition (please specify) -Unable to determine at present  Thank you, Doy MinceVangela Deja Kaigler, RN [phone] Clinical Documentation Specialist

## 2015-06-06 DIAGNOSIS — R6520 Severe sepsis without septic shock: Secondary | ICD-10-CM

## 2015-06-06 LAB — CULTURE, BLOOD (ROUTINE X 2)

## 2015-06-06 MED ORDER — CEFTRIAXONE SODIUM IN DEXTROSE 20 MG/ML IV SOLN
1.0000 g | INTRAVENOUS | Status: DC
  Administered 2015-06-06 – 2015-06-07 (×2): 1 g via INTRAVENOUS
  Filled 2015-06-06 (×3): qty 50

## 2015-06-06 MED ORDER — LISINOPRIL 2.5 MG PO TABS
2.5000 mg | ORAL_TABLET | Freq: Every day | ORAL | Status: DC
  Administered 2015-06-07: 2.5 mg via ORAL
  Filled 2015-06-06 (×2): qty 1

## 2015-06-06 MED ORDER — ENOXAPARIN SODIUM 40 MG/0.4ML ~~LOC~~ SOLN
40.0000 mg | SUBCUTANEOUS | Status: DC
  Administered 2015-06-06 – 2015-06-08 (×3): 40 mg via SUBCUTANEOUS
  Filled 2015-06-06 (×4): qty 0.4

## 2015-06-06 MED ORDER — IPRATROPIUM-ALBUTEROL 0.5-2.5 (3) MG/3ML IN SOLN: 3.0000 mL | RESPIRATORY_TRACT | Status: DC | PRN

## 2015-06-06 NOTE — Progress Notes (Signed)
Pharmacy note - blood culture results  Streptococcus pneumoniae was isolated.  The microbiology lab reported to me verbally that the isolate is sensitive to penicillin and ceftriaxone but due to a computer glitch the results are not crossing over.   The isolate is also sensitive to vancomycin, levofloxacin and erythromycin.  Celedonio MiyamotoJeremy Elasha Tess, PharmD, BCPS-AQ ID Clinical Pharmacist Pager [phone]

## 2015-06-06 NOTE — Progress Notes (Signed)
[place] TEAM 1 - Stepdown/ICU TEAM Progress Note  Miguel York DOB: January 27, 1951 DOA: 06/03/2015 PCP: No PCP Per Patient  Admit HPI / Brief Narrative: 64 y.o. male former smoker w/ a hx of CABG and COPD who had not seen a physician for almost 3 years who presented with a 3 day history of increasing dyspnea, cough productive of yellow sputum with blood streaks, subjective fever, and chest pain with coughing.  Evaluation in the emergency room shows him to be in acute renal failure, and hypotensive with an apparent pneumonia in the left lung.   HPI/Subjective: Pt is feeling much better at this time.  He denies sob, cp, n/v, or abdom pain.  He has been up and walking the halls.    Assessment/Plan:  Severe Sepsis (lactic acidosis) due to LLL Strep pneum CAP w/ confirmed bacteremia  Lactate has normalized - BP stable/now high - tachycardia resolved - narrow abx to rocephin   Acute renal failure crt 2.72 at presentation - resolved w/ volume resuscitation   Ventricular Tachycardia  Likely due to acute stress and electrolyte abnormalities - TTE notes EF 20-25% - will ask Cards to evaluate - follow on telemetry - resumed BB (on Zebeta previously) this admit    Hx of systolic CHF - EF 25% Sept 2013 EF had reportedly improved to 45-50% by Nov 2013 - TTE this admit notes EF 20-25% w/ diffuse hypokinesis - perhaps this is due to his acute illness, or indicative of re-accumulated CAD - will ask Cards to evaluate while still hospitalized - w/ V Tach ?if ICD currently indicated   Hyponatremia  Likely due to hypovolemia - resolved with volume resuscitation  Hypokalemia  Likely due to poor intake in setting of acute illness - resolved w/ supplementation  Coagulopathy INR 1.63 at presentation - likely due to an element of shock liver - coags have normalized   Normocytic anemia  Check anemia panel in AM  Elevated d-dimer Likely due to severe pneumonia with sepsis - clinical  suspicion of pulmonary embolism low - discontinued IV heparin in setting of coagulopathy - lower extremities venous duplex ruled out DVT   Diarrhea  C diff negative (low risk)   COPD - ongoing tobacco abuse Patient admits that he is still smoking - I have counseled him on the absolute need to discontinue smoking entirely and permanently and educated him on the direct link between ongoing tobacco abuse and COPD, lung cancer, and vascular disease/heart attack  CAD s/p 4V CABG Denies anginal type chest pain  S/P mitral valve repair  TTE suggests valve stable   Code Status: FULL Family Communication: no family present at time of exam Disposition Plan: Transfer to telemetry bed - PT/OT - cardiology evaluation for severe systolic CHF and bigeminy/ventricular tachycardia  Consultants: [facility] Cardiology to see on 7/14  Procedures: none  Antibiotics: Zosyn 7/10 > 7/13 Vanc 7/10 > 7/13 Rocephin 7/13 >  DVT prophylaxis: Lovenox  Objective: Blood pressure 165/73, pulse 65, temperature 97.9 F (36.6 C), temperature source Oral, resp. rate 20, height 6' (1.829 m), weight 75.7 kg (166 lb 14.2 oz), SpO2 97 %.  Intake/Output Summary (Last 24 hours) at 06/06/15 1555 Last data filed at 06/06/15 1500  Gross per 24 hour  Intake   1585 ml  Output   1126 ml  Net    459 ml   Exam: General: No acute respiratory while at rest Lungs: Mild crackles left base - no wheeze - good air movement throughout other  fields Cardiovascular: Frequent ectopic beats/irregular - rate approximately 90 - midsystolic 2/6 low pitched murmur persists Abdomen: Nontender, nondistended, soft, bowel sounds positive, no rebound, no ascites, no appreciable mass Extremities: No significant cyanosis, clubbing, edema bilateral lower extremities  Data Reviewed: Basic Metabolic Panel:  Recent Labs Lab 06/03/15 1515 06/03/15 1556 06/04/15 1003 06/05/15 0225  NA 132*  --  132* 135  K 3.4*  --  3.4* 4.3  CL 98*  --  105  109  CO2 19*  --  21* 21*  GLUCOSE 130*  --  224* 137*  BUN 46*  --  47* 46*  CREATININE 2.72*  --  1.41* 1.06  CALCIUM 8.7*  --  8.4* 8.8*  MG  --  1.8  --   --     CBC:  Recent Labs Lab 06/03/15 1515 06/04/15 0552 06/05/15 0225  WBC 8.9 7.8 10.7*  NEUTROABS 8.2*  --   --   HGB 13.3 11.3* 11.3*  HCT 39.5 33.8* 33.6*  MCV 87.0 85.8 84.4  PLT 129* 122* 125*   Liver Function Tests:  Recent Labs Lab 06/03/15 1515 06/04/15 1003 06/05/15 0225  AST 45* 31 38  ALT 29 24 24   ALKPHOS 32* 24* 72  BILITOT 2.1* 1.1 0.8  PROT 7.7 6.5 6.5  ALBUMIN 2.8* 2.0* 1.9*    Recent Labs Lab 06/03/15 1515  LIPASE 17*   Coags:  Recent Labs Lab 06/03/15 1515 06/04/15 0552 06/05/15 0225  INR 1.63* 1.56* 1.37    Recent Labs Lab 06/04/15 0552 06/05/15 0225  APTT 36 30    Cardiac Enzymes:  Recent Labs Lab 06/03/15 1515  TROPONINI <0.03    Recent Results (from the past 240 hour(s))  Blood culture (routine x 2)     Status: None (Preliminary result)   Collection Time: 06/03/15  3:21 PM  Result Value Ref Range Status   Specimen Description BLOOD LEFT WRIST COLLECTED BY RT  Final   Special Requests   Final    BOTTLES DRAWN AEROBIC AND ANAEROBIC AEB=8CC ANA=6CC   Culture NO GROWTH 3 DAYS  Final   Report Status PENDING  Incomplete  Blood culture (routine x 2)     Status: None   Collection Time: 06/03/15  3:28 PM  Result Value Ref Range Status   Specimen Description BLOOD RIGHT HAND DRAWN BY RN  Final   Special Requests BOTTLES DRAWN AEROBIC AND ANAEROBIC 8CC EACH  Final   Culture  Setup Time   Final    GRAM POSITIVE COCCI IN PAIRS Gram Stain Report Called to,Read Back By and Verified With: WOLF C. AT [facility] AT [id number] AT 0844 BY THOMPSON S ANAEROBIC BOTTLE ONLY    Culture   Final    STREPTOCOCCUS PNEUMONIAE Performed at [facility]    Report Status 06/06/2015 FINAL  Final   Organism ID, Bacteria STREPTOCOCCUS PNEUMONIAE  Final      Susceptibility    Streptococcus pneumoniae - MIC*    ERYTHROMYCIN <=0.12 SENSITIVE Sensitive     LEVOFLOXACIN 1 SENSITIVE Sensitive     VANCOMYCIN 0.25 SENSITIVE Sensitive     * STREPTOCOCCUS PNEUMONIAE  MRSA PCR Screening     Status: None   Collection Time: 06/03/15  9:40 PM  Result Value Ref Range Status   MRSA by PCR NEGATIVE NEGATIVE Final    Comment:        The GeneXpert MRSA Assay (FDA approved for NASAL specimens only), is one component of a comprehensive MRSA colonization surveillance program.  It is not intended to diagnose MRSA infection nor to guide or monitor treatment for MRSA infections.   Urine culture     Status: None   Collection Time: 06/04/15  1:45 AM  Result Value Ref Range Status   Specimen Description URINE, RANDOM  Final   Special Requests NONE  Final   Culture NO GROWTH 1 DAY  Final   Report Status 06/05/2015 FINAL  Final  Clostridium Difficile by PCR (not at [facility])     Status: None   Collection Time: 06/04/15  1:46 AM  Result Value Ref Range Status   C difficile by pcr NEGATIVE NEGATIVE Final     Studies:   Recent x-ray studies have been reviewed in detail by the Attending Physician  Scheduled Meds:  Scheduled Meds: . aspirin EC  81 mg Oral Daily  . carvedilol  3.125 mg Oral BID WC  . dextromethorphan-guaiFENesin  1 tablet Oral BID  . folic acid  1 mg Oral Daily  . heparin subcutaneous  5,000 Units Subcutaneous 3 times per day  . ipratropium-albuterol  3 mL Nebulization Q6H  . methylPREDNISolone (SOLU-MEDROL) injection  60 mg Intravenous Q24H  . multivitamin with minerals  1 tablet Oral Daily  . piperacillin-tazobactam (ZOSYN)  IV  3.375 g Intravenous Q8H  . thiamine  100 mg Oral Daily  . vancomycin  750 mg Intravenous Q12H    Time spent on care of this patient: 35 mins   Ethelwyn Gilbertson T , MD   [facility] Office  [phone] Pager - Text Page per Loretha Stapler as per below:  On-Call/Text Page:      [email or web address]      password [email or web address]  If 7PM-7AM,  please contact night-coverage [email or web address] Password [email or web address] 06/06/2015, 3:55 PM   LOS: 3 days     '

## 2015-06-07 DIAGNOSIS — I2510 Atherosclerotic heart disease of native coronary artery without angina pectoris: Secondary | ICD-10-CM

## 2015-06-07 DIAGNOSIS — R7881 Bacteremia: Secondary | ICD-10-CM

## 2015-06-07 DIAGNOSIS — I429 Cardiomyopathy, unspecified: Secondary | ICD-10-CM

## 2015-06-07 DIAGNOSIS — J154 Pneumonia due to other streptococci: Secondary | ICD-10-CM

## 2015-06-07 DIAGNOSIS — I5022 Chronic systolic (congestive) heart failure: Secondary | ICD-10-CM

## 2015-06-07 DIAGNOSIS — Z9889 Other specified postprocedural states: Secondary | ICD-10-CM

## 2015-06-07 LAB — COMPREHENSIVE METABOLIC PANEL
ALBUMIN: 1.7 g/dL — AB (ref 3.5–5.0)
ALK PHOS: 57 U/L (ref 38–126)
ALT: 28 U/L (ref 17–63)
AST: 37 U/L (ref 15–41)
Anion gap: 5 (ref 5–15)
BILIRUBIN TOTAL: 0.8 mg/dL (ref 0.3–1.2)
BUN: 26 mg/dL — ABNORMAL HIGH (ref 6–20)
CO2: 26 mmol/L (ref 22–32)
Calcium: 8.3 mg/dL — ABNORMAL LOW (ref 8.9–10.3)
Chloride: 104 mmol/L (ref 101–111)
Creatinine, Ser: 0.84 mg/dL (ref 0.61–1.24)
GFR calc Af Amer: >60 mL/min (ref >60)
GFR calc non Af Amer: >60 mL/min (ref >60)
GLUCOSE: 182 mg/dL — AB (ref 65–99)
POTASSIUM: 3.6 mmol/L (ref 3.5–5.1)
Sodium: 135 mmol/L (ref 135–145)
Total Protein: 5.9 g/dL — ABNORMAL LOW (ref 6.5–8.1)

## 2015-06-07 LAB — FERRITIN: Ferritin: 84 ng/mL (ref 24–336)

## 2015-06-07 LAB — CBC
HEMATOCRIT: 33.1 % — AB (ref 39.0–52.0)
HEMOGLOBIN: 11.4 g/dL — AB (ref 13.0–17.0)
MCH: 28.9 pg (ref 26.0–34.0)
MCHC: 34.4 g/dL (ref 30.0–36.0)
MCV: 83.8 fL (ref 78.0–100.0)
PLATELETS: 111 10*3/uL — AB (ref 150–400)
RBC: 3.95 MIL/uL — ABNORMAL LOW (ref 4.22–5.81)
RDW: 17.6 % — AB (ref 11.5–15.5)
WBC: 9.1 10*3/uL (ref 4.0–10.5)

## 2015-06-07 LAB — RETICULOCYTES
RBC.: 3.96 MIL/uL — ABNORMAL LOW (ref 4.22–5.81)
Retic Count, Absolute: 43.6 10*3/uL (ref 19.0–186.0)
Retic Ct Pct: 1.1 % (ref 0.4–3.1)

## 2015-06-07 LAB — IRON AND TIBC
Iron: 24 ug/dL — ABNORMAL LOW (ref 45–182)
SATURATION RATIOS: 9 % — AB (ref 17.9–39.5)
TIBC: 276 ug/dL (ref 250–450)
UIBC: 252 ug/dL

## 2015-06-07 LAB — FOLATE: Folate: 19.7 ng/mL (ref >5.9)

## 2015-06-07 LAB — MAGNESIUM: Magnesium: 1.7 mg/dL (ref 1.7–2.4)

## 2015-06-07 LAB — VITAMIN B12: VITAMIN B 12: 3040 pg/mL — AB (ref 180–914)

## 2015-06-07 MED ORDER — LISINOPRIL 10 MG PO TABS
10.0000 mg | ORAL_TABLET | Freq: Every day | ORAL | Status: DC
  Administered 2015-06-08 – 2015-06-09 (×2): 10 mg via ORAL
  Filled 2015-06-07 (×2): qty 1

## 2015-06-07 MED ORDER — CARVEDILOL 6.25 MG PO TABS
6.2500 mg | ORAL_TABLET | Freq: Two times a day (BID) | ORAL | Status: DC
  Administered 2015-06-07 – 2015-06-09 (×5): 6.25 mg via ORAL
  Filled 2015-06-07 (×6): qty 1

## 2015-06-07 MED ORDER — MAGNESIUM SULFATE 2 GM/50ML IV SOLN
2.0000 g | Freq: Once | INTRAVENOUS | Status: AC
  Administered 2015-06-07: 2 g via INTRAVENOUS
  Filled 2015-06-07: qty 50

## 2015-06-07 MED ORDER — FUROSEMIDE 20 MG PO TABS
20.0000 mg | ORAL_TABLET | Freq: Every day | ORAL | Status: DC
  Administered 2015-06-07 – 2015-06-09 (×3): 20 mg via ORAL
  Filled 2015-06-07 (×3): qty 1

## 2015-06-07 MED ORDER — POTASSIUM CHLORIDE CRYS ER 20 MEQ PO TBCR
40.0000 meq | EXTENDED_RELEASE_TABLET | Freq: Once | ORAL | Status: AC
  Administered 2015-06-07: 40 meq via ORAL
  Filled 2015-06-07: qty 2

## 2015-06-07 NOTE — Progress Notes (Signed)
CSW consulted for transportation issues prohibiting patient going to Dr appointments.  Pt reports that his family works during the day and cant transport to appts.  CSW [facility].  CSW signing off.  Merlyn LotJenna Holoman, LCSWA Clinical Social Worker [phone]

## 2015-06-07 NOTE — Care Management Note (Signed)
Case Management Note  Patient Details  Name: Miguel MainlandCharles E Esh MRN: [id number] Date of Birth: [birth year]  Subjective/Objective:       Pt admitted with community acquired pnemonia             Action/Plan:  Pt is independent from [place], Pt is requesting help obtaining PCP with transportation to and from appts.  CM will consult CSW.  CM will continue to monitor for dispostion   Expected Discharge Date:                  Expected Discharge Plan:  Home/Self Care  In-House Referral:     Discharge planning Services  CM Consult  Post Acute Care Choice:    Choice offered to:     DME Arranged:    DME Agency:     HH Arranged:    HH Agency:     Status of Service:  In process, will continue to follow  Medicare Important Message Given:    Date Medicare IM Given:    Medicare IM give by:    Date Additional Medicare IM Given:    Additional Medicare Important Message give by:     If discussed at Long Length of Stay Meetings, dates discussed:    Additional Comments: CM provided pt health connect sheet and instructed pt to contact services via the phone number to gain help with locating a PCP in his area within [facility] that would accept his insurance.  CM also instructed pt that he could also use the number on the back of his insurance card.  CM consulted CSW regarding transportation need to and from doctor appointment.   Cherylann ParrClaxton, Augie Vane S, RN 06/07/2015, 2:14 PM

## 2015-06-07 NOTE — Progress Notes (Signed)
UR Completed. Emmalea Treanor, RN, BSN.  [phone] 

## 2015-06-07 NOTE — Evaluation (Signed)
Physical Therapy Evaluation and Discharge Patient Details Name: Miguel York MRN: [id number] DOB: March 13, 1951 Today's Date: 06/07/2015   History of Present Illness  Adm 7/10 with pna and sepsis PMHx-COPD, CHF EF 45%, smoker, ETOH abuse, CABG  Clinical Impression  Patient evaluated by Physical Therapy with no further acute PT needs identified. SaO2 92% on room air while ambulating 400 ft with no device and no imbalance. Pt is at his baseline for mobility. PT is signing off. Thank you for this referral.     Follow Up Recommendations No PT follow up    Equipment Recommendations  None recommended by PT    Recommendations for Other Services       Precautions / Restrictions Precautions Precautions: None      Mobility  Bed Mobility Overal bed mobility: Independent                Transfers Overall transfer level: Independent Equipment used: None                Ambulation/Gait Ambulation/Gait assistance: Independent Ambulation Distance (Feet): 400 Feet Assistive device: None Gait Pattern/deviations: WFL(Within Functional Limits)        Stairs            Wheelchair Mobility    Modified Rankin (Stroke Patients Only)       Balance Overall balance assessment: Independent                               Standardized Balance Assessment Standardized Balance Assessment : Berg Balance Test Berg Balance Test Sit to Stand: Able to stand without using hands and stabilize independently Standing Unsupported: Able to stand safely 2 minutes Sitting with Back Unsupported but Feet Supported on Floor or Stool: Able to sit safely and securely 2 minutes Stand to Sit: Sits safely with minimal use of hands Transfers: Able to transfer safely, minor use of hands Standing Unsupported with Eyes Closed: Able to stand 10 seconds safely Standing Ubsupported with Feet Together: Able to place feet together independently and stand 1 minute safely From  Standing, Reach Forward with Outstretched Arm: Can reach confidently >25 cm (10") Turn 360 Degrees: Able to turn 360 degrees safely in 4 seconds or less Standing Unsupported, One Foot in Front: Able to plae foot ahead of the other independently and hold 30 seconds         Pertinent Vitals/Pain Pain Assessment: No/denies pain    Home Living Family/patient expects to be discharged to:: Private residence Living Arrangements: Alone             Home Equipment: None      Prior Function Level of Independence: Independent         Comments: works Scientist, research (medical) (stands all day)     Higher education careers adviser        Extremity/Trunk Assessment   Upper Extremity Assessment: Overall WFL for tasks assessed           Lower Extremity Assessment: Overall WFL for tasks assessed      Cervical / Trunk Assessment: Normal  Communication   Communication: No difficulties  Cognition Arousal/Alertness: Awake/alert Behavior During Therapy: WFL for tasks assessed/performed Overall Cognitive Status: Within Functional Limits for tasks assessed                      General Comments      Exercises        Assessment/Plan  PT Assessment Patent does not need any further PT services  PT Diagnosis Difficulty walking   PT Problem List    PT Treatment Interventions     PT Goals (Current goals can be found in the Care Plan section) Acute Rehab PT Goals PT Goal Formulation: All assessment and education complete, DC therapy    Frequency     Barriers to discharge        Co-evaluation               End of Session Equipment Utilized During Treatment: Gait belt Activity Tolerance: Patient tolerated treatment well Patient left: in bed;with call bell/phone within reach (sitting EOB; does not like recliner) Nurse Communication: Mobility status (no PT needs)         Time: [id number] PT Time Calculation (min) (ACUTE ONLY): 20 min   Charges:   PT Evaluation $Initial PT  Evaluation Tier I: 1 Procedure     PT G Codes:        Miguel York 06/07/2015, 3:39 PM Pager [phone]

## 2015-06-07 NOTE — Progress Notes (Signed)
Progress Note  Miguel York RUE:[id number] DOB: [birth year] DOA: 06/03/2015 PCP: No PCP Per Patient  Admit HPI / Brief Narrative: 64 y.o. male, former smoker w/ a hx of CAD, CABG, ischemic cardiomyopathy, chronic systolic CHF, s/p mitral valve repair, COPD, ongoing tobacco and alcohol abuse, hyponatremia, iron deficiency anemia and HLD who had not seen a physician for almost 3 years , noncompliant with medications, [facility] 06[facility]15 with a 3 day history of increasing dyspnea, cough productive of yellow sputum with blood streaks, subjective fever, and chest pain with coughing. He was initially admitted to stepdown unit for severe sepsis due to LLL pneumococcal CAP, bacteremia and acute kidney injury. After improvement and stabilization, transferred to floor on 7/13. Cardiology consulted 7/14 for cardiomyopathy. Social issues: Patient lives alone, does not drive and has no family members to bring him to M.D. visits.   HPI/Subjective: Feels generally weak this morning but denied dyspnea, chest pain or cough.   Assessment/Plan:  Severe Sepsis (lactic acidosis) due to LLL Strep pneum CAP w/ confirmed bacteremia  Lactate has normalized - BP stable/now high - tachycardia resolved - narrow abx to rocephin  One of 2 blood cultures was positive for Streptococcus pneumoniae. 2 of 2 blood cultures negative to date. Urine culture negative. Urine Legionella antigen negative. Urine pneumococcal antigen positive.  Acute renal failure crt 2.72 at presentation - resolved w/ volume resuscitation   Ventricular Tachycardia  Likely due to acute stress and electrolyte abnormalities - TTE notes EF 20-25%   - resumed carvedilol (on Zebeta previously) this admit and increased to 6.25 twice a day. Lisinopril increased to 10 MG daily. - Although previous notes suggest V. tach, did not see any obvious episodes of ventricular tachycardia on telemetry (reviewed by cardiology too). However patient does have  frequent PVCs. - Replace potassium and maintain >4 and magnesium >2  Chronic systolic systolic CHF - EF 25% Sept 2013 EF had reportedly improved to 45-50% by Nov 2013 - TTE this admit notes EF 20-25% w/ diffuse hypokinesis - perhaps this is due to his acute illness, or indicative of re-accumulated CAD  - Cardiology consultation appreciated. Increasing carvedilol and lisinopril. - Once respiratory issues have resolved, recommending nuclear stress test to rule out ischemia versus cath rule out graft failure. - Worsening EF may be due to progressive CAD versus untreated hypertension  Hyponatremia  Likely due to hypovolemia - resolved with volume resuscitation  Hypokalemia  Likely due to poor intake in setting of acute illness - will replace to keep >4  Coagulopathy INR 1.63 at presentation - likely due to an element of shock liver - coags have normalized   Normocytic anemia  Suggestive of iron deficiency. Will need outpatient follow-up and evaluation as deemed necessary.  Thrombocytopenia - Stable  Elevated d-dimer Likely due to severe pneumonia with sepsis - clinical suspicion of pulmonary embolism low - discontinued IV heparin in setting of coagulopathy - lower extremities venous duplex ruled out DVT   Diarrhea  C diff negative (low risk)   COPD - ongoing tobacco abuse - Cessation counseling done by Dr. Sharon SellerMcClung   CAD s/p 4V CABG Denies anginal type chest pain. Management as above  S/P mitral valve repair  TTE suggests valve stable   Alcohol use - States that he drinks 2 beers per day. Has been in the hospital for 4 days without withdrawal features. Abstinence counseled  Social issues - Patient states that he lives alone, does not drive and family works and hence  he is not able to come for M.D. appointments. He is requesting M.D. close to where he lives. Case management consult   DVT prophylaxis Lovenox  Code Status: FULL Family Communication: no family present at time  of exam Disposition Plan: DC home when medically stable.    Consultants: [facility] Cardiology  Procedures: none  Antibiotics: Zosyn 7/10 > 7/13 Vanc 7/10 > 7/13 Rocephin 7/13 >   Objective:  Filed Vitals:   06/06/15 1945 06/06/15 2041 06/07/15 0523 06/07/15 1515  BP: 162/88 151/98 168/105 144/73  Pulse:  65 72 70  Temp: 97.8 F (36.6 C) 97.6 F (36.4 C) 97.6 F (36.4 C) 97.6 F (36.4 C)  TempSrc: Oral Oral Oral Oral  Resp:  20 20 19   Height:      Weight:      SpO2: 95% 98% 97% 94%     Intake/Output Summary (Last 24 hours) at 06/07/15 1526 Last data filed at 06/06/15 1800  Gross per 24 hour  Intake  72.67 ml  Output      0 ml  Net  72.67 ml   Exam: General:Pleasant middle-aged male lying comfortably supine in bed.  Lungs: Clear to auscultation.  Cardiovascular: S1 and S2 heard, RRR with frequent ectopics, no JVD. 2/6 systolic murmur at apex. No pedal edema. Telemetry: Sinus rhythm with frequent PVCs. No VT appreciated.  Abdomen: Nontender, nondistended, soft, bowel sounds positive, no rebound, no ascites, no appreciable mass Extremities: No significant cyanosis, clubbing, edema bilateral lower extremities  Data Reviewed: Basic Metabolic Panel:  Recent Labs Lab 06/03/15 1515 06/03/15 1556 06/04/15 1003 06/05/15 0225 06/07/15 0044 06/07/15 0456  NA 132*  --  132* 135 135  --   K 3.4*  --  3.4* 4.3 3.6  --   CL 98*  --  105 109 104  --   CO2 19*  --  21* 21* 26  --   GLUCOSE 130*  --  224* 137* 182*  --   BUN 46*  --  47* 46* 26*  --   CREATININE 2.72*  --  1.41* 1.06 0.84  --   CALCIUM 8.7*  --  8.4* 8.8* 8.3*  --   MG  --  1.8  --   --   --  1.7    CBC:  Recent Labs Lab 06/03/15 1515 06/04/15 0552 06/05/15 0225 06/07/15 0044  WBC 8.9 7.8 10.7* 9.1  NEUTROABS 8.2*  --   --   --   HGB 13.3 11.3* 11.3* 11.4*  HCT 39.5 33.8* 33.6* 33.1*  MCV 87.0 85.8 84.4 83.8  PLT 129* 122* 125* 111*   Liver Function Tests:  Recent Labs Lab  06/03/15 1515 06/04/15 1003 06/05/15 0225 06/07/15 0044  AST 45* 31 38 37  ALT 29 24 24 28   ALKPHOS 32* 24* 72 57  BILITOT 2.1* 1.1 0.8 0.8  PROT 7.7 6.5 6.5 5.9*  ALBUMIN 2.8* 2.0* 1.9* 1.7*    Recent Labs Lab 06/03/15 1515  LIPASE 17*   Coags:  Recent Labs Lab 06/03/15 1515 06/04/15 0552 06/05/15 0225  INR 1.63* 1.56* 1.37    Recent Labs Lab 06/04/15 0552 06/05/15 0225  APTT 36 30    Cardiac Enzymes:  Recent Labs Lab 06/03/15 1515  TROPONINI <0.03    Recent Results (from the past 240 hour(s))  Blood culture (routine x 2)     Status: None (Preliminary result)   Collection Time: 06/03/15  3:21 PM  Result Value Ref Range Status   Specimen  Description BLOOD LEFT WRIST COLLECTED BY RT  Final   Special Requests   Final    BOTTLES DRAWN AEROBIC AND ANAEROBIC AEB=8CC ANA=6CC   Culture NO GROWTH 4 DAYS  Final   Report Status PENDING  Incomplete  Blood culture (routine x 2)     Status: None   Collection Time: 06/03/15  3:28 PM  Result Value Ref Range Status   Specimen Description BLOOD RIGHT HAND DRAWN BY RN  Final   Special Requests BOTTLES DRAWN AEROBIC AND ANAEROBIC 8CC EACH  Final   Culture  Setup Time   Final    GRAM POSITIVE COCCI IN PAIRS Gram Stain Report Called to,Read Back By and Verified With: WOLF C. AT [facility] AT [id number] AT 0844 BY THOMPSON S ANAEROBIC BOTTLE ONLY    Culture   Final    STREPTOCOCCUS PNEUMONIAE Performed at [facility]    Report Status 06/06/2015 FINAL  Final   Organism ID, Bacteria STREPTOCOCCUS PNEUMONIAE  Final      Susceptibility   Streptococcus pneumoniae - MIC*    ERYTHROMYCIN <=0.12 SENSITIVE Sensitive     LEVOFLOXACIN 1 SENSITIVE Sensitive     VANCOMYCIN 0.25 SENSITIVE Sensitive     PENICILLIN <=0.06 SENSITIVE Sensitive     CEFTRIAXONE <=0.12 SENSITIVE Sensitive     * STREPTOCOCCUS PNEUMONIAE  MRSA PCR Screening     Status: None   Collection Time: 06/03/15  9:40 PM  Result Value Ref Range Status   MRSA by  PCR NEGATIVE NEGATIVE Final    Comment:        The GeneXpert MRSA Assay (FDA approved for NASAL specimens only), is one component of a comprehensive MRSA colonization surveillance program. It is not intended to diagnose MRSA infection nor to guide or monitor treatment for MRSA infections.   Urine culture     Status: None   Collection Time: 06/04/15  1:45 AM  Result Value Ref Range Status   Specimen Description URINE, RANDOM  Final   Special Requests NONE  Final   Culture NO GROWTH 1 DAY  Final   Report Status 06/05/2015 FINAL  Final  Clostridium Difficile by PCR (not at [facility])     Status: None   Collection Time: 06/04/15  1:46 AM  Result Value Ref Range Status   C difficile by pcr NEGATIVE NEGATIVE Final     Studies:   Recent x-ray studies have been reviewed in detail by the Attending Physician  Scheduled Meds:  Scheduled Meds: . aspirin EC  81 mg Oral Daily  . carvedilol  6.25 mg Oral BID WC  . cefTRIAXone (ROCEPHIN)  IV  1 g Intravenous Q24H  . dextromethorphan-guaiFENesin  1 tablet Oral BID  . enoxaparin (LOVENOX) injection  40 mg Subcutaneous Q24H  . folic acid  1 mg Oral Daily  . furosemide  20 mg Oral Daily  . [START ON 06/08/2015] lisinopril  10 mg Oral Daily  . multivitamin with minerals  1 tablet Oral Daily  . thiamine  100 mg Oral Daily    Time spent on care of this patient: 35 mins   Narelle Schoening, MD, FACP, FHM. [facility] Pager [phone]  If 7PM-7AM, please contact night-coverage [email or web address] Password [email or web address] 06/07/2015, 3:42 PM   LOS: 4 days     '

## 2015-06-07 NOTE — Consult Note (Addendum)
Admit date: 06/03/2015 Referring Physician  Dr. Waymon AmatoHongalgi Primary Physician  None Primary Cardiologist  Dr. Gala RomneyBensimhon Reason for Consultation  DCM on echo  HPI: This is a 64 year old man who has not seen a physician for almost 3 years. He has a history of chronic systolic heart failure EF 15 %, ICM, CAD S/P CABG x 3 07/23/12 (LIMA to LAD, SVG to PDA, SVG to second of his marginal, SVG to first diagonal, and additionally had clipping of the left atrial appendage), COPD, Moderate MR/TR S/P MV repair with 26mm Sorin Memo 3D ring annuloplasty 07/23/12, and ETOH abuse (18-24 beers per day) and quit in 2013.    Mr Miguel York was discharged from [facility] 07/28/12 after CABG x 3, MVR repair, and clipping of atrial appendage. His discharge weight was 167 pounds.   08/03/12 Direct admit to 2900. Admit weight 174 pounds. Received Milrinone for 24 hours. Milrinone stopped 08/04/12. 08/03/12 ECHO EF 25% diffuse hypokinesis. HF medication initiated. D/C with lifevest. Discharge weight 158 pounds. Discharge Medications Bisoprolol 2.5 Losartan 25 mg daily Dig 0.125 mg daily Lasix 40 bid Spiro 25 mg daily Kcl 20 daily Metolazone 2.5 prn for weight 163 or greater. 08/11/12 Potassium 3.9   ECHO 10/18/12 EF 40-45% RV function improved. This was the last time he was seen in CHF clinic.   He is on no medications. He is a smoker. He has COPD. He presented to the ER with a 2 to three-day history of increasing dyspnea, cough productive of yellow sputum with blood streaks and also hot/cold feelings indicative of fever. He has had chest pain with coughing. Evaluation in the emergency room showed him to be in acute renal failure, hypotensive and he appeared to have a pneumonia on the left lung. He was admitted for further management by hospitalist.  He was found to have strep Pneumonia sepsis with LLL PNA as the source by blood cultures.  His acute renal failure resolved with IVF hydration.  He was noted on tele by hospitalist to possibly  have ventricular arrhythmias and 2D echo showed EF 20-25% with diffuse hypokinesis.  He denies any anginal chest pain, SOB, DOE, LE edema.  Cardiology is now asked to evaluate.      PMH:   Past Medical History  Diagnosis Date  . COPD (chronic obstructive pulmonary disease)   . Systolic CHF 07/15/2012    Echo 08/03/12 EF 25%, diffuse hypokinesis, trivial MR, mild R/LAE, mod TR, PASP 31mmHg  . Coronary artery disease 07/15/2012    S/P 4v CABG 07/23/12  . 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 Ischemic cardiomyopathy     EF 25%, LifeVest placed 08/08/12  . History of tobacco abuse     quit 06/2012  . History of ETOH abuse     quit 06/2012  . Hyponatremia 07/2012  . Iron deficiency anemia 07/2012  . Elevated LFTs 07/2012  . HLD (hyperlipidemia)      PSH:   Past Surgical History  Procedure Laterality Date  . Tee without cardioversion  07/19/2012    Procedure: TRANSESOPHAGEAL ECHOCARDIOGRAM (TEE);  Surgeon: Pricilla RifflePaula V Ross, MD;  [facility];  Service: Cardiovascular;  Laterality: N/A;  . Multiple extractions with alveoloplasty  07/20/2012    Procedure: MULTIPLE  EXTRACION WITH ALVEOLOPLASTY;  Surgeon: Charlynne Pander, DDS;  Location: [facility] OR;  Service: Oral Surgery;  Laterality: N/A;  Extraction of tooth #'s 2,3,4,5,6,7,8,9,10,11, 17,19, 20, 21,22,27,28,29 with alveoloplasty  . Coronary artery bypass graft  07/23/2012    Procedure: CORONARY ARTERY BYPASS GRAFTING (CABG);  Surgeon: Purcell Nails, MD;  Location: [facility] OR;  Service: Open Heart Surgery;  Laterality: N/A;  . Mitral valve repair  07/23/2012    Procedure: MITRAL VALVE REPAIR (MVR);  Surgeon: Purcell Nails, MD;  Location: [facility] OR;  Service: Open Heart Surgery;  Laterality: N/A;  . Mouth surgery    . Clipping left atrial appendage  07/23/2012  . No past  surgeries      Allergies:  Review of patient's allergies indicates no known allergies. Prior to Admit Meds:   Prescriptions prior to admission  Medication Sig Dispense Refill Last Dose  . Chlorphen-Pseudoephed-APAP (THERAFLU FLU/COLD PO) Take 15 mLs by mouth daily as needed (for cough).   06/03/2015 at Unknown time  . ibuprofen (ADVIL) 200 MG tablet Take 400 mg by mouth every 6 (six) hours as needed for mild pain or moderate pain.   06/03/2015 at Unknown time  . albuterol (PROVENTIL HFA;VENTOLIN HFA) 108 (90 BASE) MCG/ACT inhaler Inhale 2 puffs into the lungs as needed. For shortness of breath or wheezing   Taking  . albuterol (PROVENTIL) (2.5 MG/3ML) 0.083% nebulizer solution Take 2.5 mg by nebulization daily as needed. For shortness of breath or wheezing   Taking   Fam HX:    Family History  Problem Relation Age of Onset  . Heart disease Mother   . Heart attack Mother   . Heart failure Mother   . Heart attack Sister   . Heart disease Sister    Social HX:    History   Social History  . Marital Status: Single    Spouse Name: N/A  . Number of Children: N/A  . Years of Education: N/A   Occupational History  . Not on file.   Social History Main Topics  . Smoking status: Former Smoker -- 1.50 packs/day for 46 years    Quit date: 07/01/2012  . Smokeless tobacco: Former Neurosurgeon    Types: Chew  . Alcohol Use: 8.4 oz/week    14 Cans of beer per week     Comment: occ  quit august 2013  . Drug Use: No     Comment: former crack and marijuana  . Sexual Activity: Yes   Other Topics Concern  . Not on file   Social History Narrative     ROS:  All 11 ROS were addressed and are negative except what is stated in the HPI  Physical Exam: Blood pressure 168/105, pulse 72, temperature 97.6 F (36.4 C), temperature source Oral, resp. rate 20, height 6' (1.829 m), weight 166 lb 14.2 oz (75.7 kg), SpO2 97 %.    General: Well developed, well nourished, in no acute distress Head: Eyes  PERRLA, No xanthomas.   Normal cephalic and atramatic  Lungs:   Clear bilaterally to auscultation and percussion. Heart:   HRRR S1 S2 Pulses are 2+ & equal.            No carotid bruit. No JVD.  No abdominal bruits. No femoral bruits. Abdomen: Bowel sounds are positive, abdomen soft and non-tender without masses Extremities:   No clubbing, cyanosis or edema.  DP +1 Neuro: Alert and oriented X 3. Psych:  Good affect, responds appropriately    Labs:  Lab Results  Component Value Date   WBC 9.1 06/07/2015   HGB 11.4* 06/07/2015   HCT 33.1* 06/07/2015   MCV 83.8 06/07/2015   PLT 111* 06/07/2015    Recent Labs Lab 06/07/15 0044  NA 135  K 3.6  CL 104  CO2 26  BUN 26*  CREATININE 0.84  CALCIUM 8.3*  PROT 5.9*  BILITOT 0.8  ALKPHOS 57  ALT 28  AST 37  GLUCOSE 182*   No results found for: PTT Lab Results  Component Value Date   INR 1.37 06/05/2015   INR 1.56* 06/04/2015   INR 1.63* 06/03/2015   Lab Results  Component Value Date   TROPONINI <0.03 06/03/2015     Lab Results  Component Value Date   CHOL 131 07/16/2012   CHOL 145 07/06/2012   Lab Results  Component Value Date   HDL 29* 07/16/2012   HDL 36* 07/06/2012   Lab Results  Component Value Date   LDLCALC 90 07/16/2012   LDLCALC 97 07/06/2012   Lab Results  Component Value Date   TRIG 60 07/16/2012   TRIG 60 07/06/2012   Lab Results  Component Value Date   CHOLHDL 4.5 07/16/2012   CHOLHDL 4.0 07/06/2012   No results found for: LDLDIRECT    Radiology:  No results found.  EKG:  NSR with frequent ventricular ectopy with bigeminal PVC's.    ASSESSMENT/PLAN: 1.  Ventricular ectopy - he is asymptomatic.  Potassium has been repleted.  There was some question of VTACH on monitor but I have reviewed all strips and there is no ventricular tachycardia.  The monitor called several episodes VT but it was artifact. 2.  Ischemic DCM - EF initially had been 15% but then improved to 40-45% by echo 2013.   Now EF appears reduced again at 20-25%.  He had stopped all his heart meds.  Agree with restarting carvedilol and increase to 6.25mg  BID.  Will increase Lisinopril to  daily given elevated BP.  Once acute respiratory illness resolves would recommend nuclear stress test to rule out ischemia vs. Cath to rule out graft failure.  Will leave final decision to Dr. Shirlee Latch.  Worsening EF may be due to poorly controlled BP from stopping meds but need to consider progression of CAD. 3.  ASCAD s/p remote CABG with no angina - 2013 4.  MR s/p MV annuloplasty ring 2013 5.  Polysubstance abuse with tobacco and ETOH 6.  Strep Pneum bacteremia secondary to LLL PNA on antibx per Hospitalist 7.  Acute renal failure resolved with IVF resuscitation 7.  Chronic systolic CHF - BNP mildly elevated but this may be where he lives.  Chest xray does not show volume overload and he was dehydrated when he was admitted.  Will add low dose maintenance PO Lasix which may need to be increased as needed for volume overload.  Quintella Reichert, MD  06/07/2015  10:44 AM

## 2015-06-08 DIAGNOSIS — J189 Pneumonia, unspecified organism: Secondary | ICD-10-CM

## 2015-06-08 LAB — CULTURE, BLOOD (ROUTINE X 2): Culture: NO GROWTH

## 2015-06-08 LAB — BASIC METABOLIC PANEL
Anion gap: 6 (ref 5–15)
BUN: 16 mg/dL (ref 6–20)
CALCIUM: 8.1 mg/dL — AB (ref 8.9–10.3)
CO2: 25 mmol/L (ref 22–32)
CREATININE: 0.72 mg/dL (ref 0.61–1.24)
Chloride: 99 mmol/L — ABNORMAL LOW (ref 101–111)
GFR calc Af Amer: >60 mL/min (ref >60)
GFR calc non Af Amer: >60 mL/min (ref >60)
Glucose, Bld: 139 mg/dL — ABNORMAL HIGH (ref 65–99)
POTASSIUM: 3.6 mmol/L (ref 3.5–5.1)
Sodium: 130 mmol/L — ABNORMAL LOW (ref 135–145)

## 2015-06-08 LAB — MAGNESIUM: MAGNESIUM: 1.8 mg/dL (ref 1.7–2.4)

## 2015-06-08 LAB — CBC
HCT: 35.9 % — ABNORMAL LOW (ref 39.0–52.0)
HEMOGLOBIN: 12 g/dL — AB (ref 13.0–17.0)
MCH: 27.9 pg (ref 26.0–34.0)
MCHC: 33.4 g/dL (ref 30.0–36.0)
MCV: 83.5 fL (ref 78.0–100.0)
PLATELETS: 134 10*3/uL — AB (ref 150–400)
RBC: 4.3 MIL/uL (ref 4.22–5.81)
RDW: 17.5 % — ABNORMAL HIGH (ref 11.5–15.5)
WBC: 11.3 10*3/uL — AB (ref 4.0–10.5)

## 2015-06-08 MED ORDER — POTASSIUM CHLORIDE CRYS ER 20 MEQ PO TBCR
40.0000 meq | EXTENDED_RELEASE_TABLET | Freq: Once | ORAL | Status: AC
  Administered 2015-06-08: 40 meq via ORAL
  Filled 2015-06-08: qty 2

## 2015-06-08 MED ORDER — ATORVASTATIN CALCIUM 40 MG PO TABS
40.0000 mg | ORAL_TABLET | Freq: Every day | ORAL | Status: DC
  Administered 2015-06-08 – 2015-06-09 (×2): 40 mg via ORAL
  Filled 2015-06-08 (×2): qty 1

## 2015-06-08 MED ORDER — AMOXICILLIN 500 MG PO CAPS
500.0000 mg | ORAL_CAPSULE | Freq: Three times a day (TID) | ORAL | Status: DC
  Administered 2015-06-08 – 2015-06-09 (×4): 500 mg via ORAL
  Filled 2015-06-08 (×5): qty 1

## 2015-06-08 MED ORDER — SPIRONOLACTONE 12.5 MG HALF TABLET
12.5000 mg | ORAL_TABLET | Freq: Every day | ORAL | Status: DC
  Administered 2015-06-08 – 2015-06-09 (×2): 12.5 mg via ORAL
  Filled 2015-06-08 (×2): qty 1

## 2015-06-08 MED ORDER — MAGNESIUM OXIDE 400 (241.3 MG) MG PO TABS
400.0000 mg | ORAL_TABLET | Freq: Two times a day (BID) | ORAL | Status: DC
  Administered 2015-06-08 – 2015-06-09 (×3): 400 mg via ORAL
  Filled 2015-06-08 (×4): qty 1

## 2015-06-08 NOTE — Progress Notes (Addendum)
Advanced Heart Failure Rounding Note   Subjective:    Says his breathing and energy are much better since admit.  Says he may have overdone it a little yesterday walking as he "wore himself out" but has no complaints today.  Denies SOB, orthopnea, or PND.   Fluid status overall + this admission. No output recorded yesterday but patient peeing.  Says at home on last Friday he became acutely SOB with profound fatigue.  Had worsening PND up to that point.    Objective:   Weight Range: 166 lb 14.2 oz (75.7 kg) Body mass index is 22.63 kg/(m^2).   Vital Signs:   Temp:  [97.6 F (36.4 C)-99.2 F (37.3 C)] 97.6 F (36.4 C) (07/15 0837) Pulse Rate:  [70-77] 72 (07/15 0837) Resp:  [19-20] 20 (07/15 0837) BP: (101-144)/(73-91) 101/80 mmHg (07/15 0837) SpO2:  [94 %-97 %] 97 % (07/15 0837) Last BM Date: 06/07/15  Weight change: Filed Weights   06/03/15 1449 06/03/15 2100  Weight: 135 lb (61.236 kg) 166 lb 14.2 oz (75.7 kg)    Intake/Output:   Intake/Output Summary (Last 24 hours) at 06/08/15 0858 Last data filed at 06/07/15 1500  Gross per 24 hour  Intake    240 ml  Output      0 ml  Net    240 ml     Physical Exam: General: Pleasant, NAD. Neuro: Alert and oriented X 3. Moves all extremities spontaneously. Psych: Normal affect. HEENT: Normal Neck: Supple without bruits or JVD. Lungs: Resp regular and unlabored. Lung sounds diminished LL lobe. Heart: RRR no s3, s4, or murmurs appreciated.  Abdomen: Soft, non-tender, non-distended, BS + x 4.  Extremities: No clubbing, cyanosis or edema. DP/PT/Radials 2+ and equal bilaterally.   Telemetry: Sinus rhythm 70s occasional PVC. ?NSVT vs artifact.  Labs: CBC  Recent Labs  06/07/15 0044 06/08/15 0420  WBC 9.1 11.3*  HGB 11.4* 12.0*  HCT 33.1* 35.9*  MCV 83.8 83.5  PLT 111* 134*   Basic Metabolic Panel  Recent Labs  06/07/15 0044 06/07/15 0456 06/08/15 0420  NA 135  --  130*  K 3.6  --  3.6  CL 104   --  99*  CO2 26  --  25  GLUCOSE 182*  --  139*  BUN 26*  --  16  CALCIUM 8.3*  --  8.1*  MG  --  1.7 1.8   Liver Function Tests  Recent Labs  06/07/15 0044  AST 37  ALT 28  ALKPHOS 57  BILITOT 0.8  PROT 5.9*  ALBUMIN 1.7*   No results for input(s): LIPASE, AMYLASE in the last 72 hours. Cardiac Enzymes No results for input(s): CKTOTAL, CKMB, CKMBINDEX, TROPONINI in the last 72 hours.  BNP: BNP (last 3 results)  Recent Labs  06/03/15 1515  BNP 551.0*    ProBNP (last 3 results) No results for input(s): PROBNP in the last 8760 hours.   D-Dimer No results for input(s): DDIMER in the last 72 hours. Hemoglobin A1C No results for input(s): HGBA1C in the last 72 hours. Fasting Lipid Panel No results for input(s): CHOL, HDL, LDLCALC, TRIG, CHOLHDL, LDLDIRECT in the last 72 hours. Thyroid Function Tests No results for input(s): TSH, T4TOTAL, T3FREE, THYROIDAB in the last 72 hours.  Invalid input(s): FREET3  Other results:     Imaging/Studies:   No results found.  Latest Echo  Latest Cath   Medications:     Scheduled Medications: . aspirin EC  81 mg Oral Daily  .  carvedilol  6.25 mg Oral BID WC  . cefTRIAXone (ROCEPHIN)  IV  1 g Intravenous Q24H  . dextromethorphan-guaiFENesin  1 tablet Oral BID  . enoxaparin (LOVENOX) injection  40 mg Subcutaneous Q24H  . folic acid  1 mg Oral Daily  . furosemide  20 mg Oral Daily  . lisinopril  10 mg Oral Daily  . magnesium oxide  400 mg Oral BID  . multivitamin with minerals  1 tablet Oral Daily  . thiamine  100 mg Oral Daily     Infusions:     PRN Medications:  ipratropium-albuterol   Assessment/Plan    1. Acute on chronic systolic CHF EF 20-25%, down from 40-45% in 2013 - iCM - Does not appear fluid overloaded on exam - Likely in setting of medical non compliance vs worsening CAD - Ischemic workup recommended ? Nuclear stress vs cath to r/o graft failure. - CXR does not show volume overload -  On lasix 20 mg daily, po. - On coreg, lisinopril, baby ASA - No output recorded for yesterday, will add Strict I/Os to orders - Daily weights. - CXR shows persistent left lower lobe atelectasis/infiltrate with small left pleural effusion 2. Ventricular ectopy - he is asymptomatic.  - Monitoring and supping electrolytes as needed - K 3.6, Mg 1.8 with goal of > 4 and >2 respectively. - On K and Mg replacement - Review of telemetry shows notes of VT but appear to be artifact 3. ASCAD s/p remote CABG with no angina - 2013 - On baby ASA - With worsening EF, would benefit from repeat Ischemic workup.  Will consult MD for best approach. 4. MR s/p MV annuloplasty ring 2013 5. Polysubstance abuse with tobacco and ETOH 6. Strep Pneum bacteremia secondary to LLL PNA  - on Rocephin per Hospitalist 7. AKI on CKD stage 2 - Resolved with IVF resuscitation - Cr 2.72 on admission.  0.72 today.  Length of Stay: 5  Graciella Freer PA-C 06/08/2015, 8:58 AM  Advanced Heart Failure Team Pager [phone] (M-F; 7a - 4p)  Please contact [facility] for night-coverage after hours (4p -7a ) and weekends on [email or web address]  Patient seen with PA, agree with the above note.  1. Strep pneumoniae CAP: Being treated by the hospitalist.  This prompted his admission.  2. AKI: Rapid improvement with hydration, suspect pre-renal.  Creatinine stable, back on low dose Lasix.  3. Chronic systolic CHF: Ischemic cardiomyopathy.  Echo with EF 20-25%, down from 40-45% in 11/13.  He is not volume overloaded on exam. He has not been taking any medications at home.  No dyspnea currently.  Uncertain of cause of fall in EF: may have happened over time off cardiac meds but cannot rule out progressive CAD.  No active chest pain.  - Continue Coreg 6.25 mg bid, lisinopril 10 mg daily, Lasix 20 mg daily. - Add spironolactone 12.5 mg daily.  - Lexiscan Cardiolite tomorrow to assess for significant ischemia.  4. CAD: s/p  CABG.  No chest pain but EF has fallen.  Plan Cardiolite as above.  Continue ASA 81 and add back statin (atorvastatin 40 daily).  5. He does not have a ride to get from [place] to [place] so had not been seeing cardiology anymore.  He probably will need to be set up to see Dr Antoine Poche in [place] after discharge.   Marca Ancona 06/08/2015 2:12 PM

## 2015-06-08 NOTE — Progress Notes (Signed)
Progress Note  Miguel [place] NWG:[id number] DOB: December 13, 1950 DOA: 06/03/2015 PCP: No PCP Per Patient  Admit HPI / Brief Narrative: 64 y.o. male, former smoker w/ a hx of CAD, CABG, ischemic cardiomyopathy, chronic systolic CHF, s/p mitral valve repair, COPD, ongoing tobacco and alcohol abuse, hyponatremia, iron deficiency anemia and HLD who had not seen a physician for almost 3 years , noncompliant with medications, admitted to [facility] on 06/03/15 with a 3 day history of increasing dyspnea, cough productive of yellow sputum with blood streaks, subjective fever, and chest pain with coughing. He was initially admitted to stepdown unit for severe sepsis due to LLL pneumococcal CAP, bacteremia and acute kidney injury. After improvement and stabilization, transferred to floor on 7/13. Cardiology consulted 7/14 for cardiomyopathy. Social issues: Patient lives alone, does not drive and has no family members to bring him to M.D. visits.   HPI/Subjective: Denied complaints. Denied dyspnea or chest pain. Seen ambulating in room.  Assessment/Plan:  Severe Sepsis (lactic acidosis) due to LLL Strep pneum CAP w/ confirmed bacteremia  Sepsis physiology resolved. Initially treated with IV Zosyn, then narrowed to Rocephin. Has completed approximately 4 days worth of IV antibiotics on 4/15. One of 2 blood cultures was positive for Streptococcus pneumoniae. 2 of 2 blood cultures negative to date. Urine culture negative. Urine Legionella antigen negative. Urine pneumococcal antigen positive. After discussing with infectious disease M.D. on call on 7/15, changed antibiotics to oral amoxicillin to complete total 10 days treatment.  Acute renal failure crt 2.72 at presentation - resolved w/ volume resuscitation   Ventricular ectopy Likely due to acute stress and electrolyte abnormalities - TTE notes EF 20-25%   - resumed carvedilol (on Zebeta previously) this admit and increased to 6.25 twice a day. Lisinopril  increased to 10 MG daily. - Although previous notes suggest V. tach, did not see any obvious episodes of ventricular tachycardia on telemetry (reviewed by cardiology too). However patient does have frequent PVCs. - Replace potassium and maintain >4 and magnesium >2  Chronic systolic systolic CHF/ischemic cardiomyopathy/LVEF 20-25 percent this admission EF had reportedly improved to 45-50% by Nov 2013 - TTE this admit notes EF 20-25% w/ diffuse hypokinesis - perhaps this is due to his acute illness, or indicative of re-accumulated CAD  - Cardiology consultation appreciated. Increasing carvedilol and lisinopril. - Once respiratory issues have resolved, recommending nuclear stress test to rule out ischemia versus cath rule out graft failure. - Worsening EF may be due to progressive CAD versus untreated hypertension - Continue carvedilol, lisinopril, Lasix and Aldactone added - Cardiology follow-up appreciated: Plan for Lexiscan Cardiolite on 7/16  Hyponatremia  Likely due to hypovolemia - resolved with volume resuscitation  Hypokalemia  Likely due to poor intake in setting of acute illness - will replace to keep >4  Coagulopathy INR 1.63 at presentation - likely due to an element of shock liver - coags have normalized   Normocytic anemia  Suggestive of iron deficiency. Will need outpatient follow-up and evaluation as deemed necessary.  Thrombocytopenia - Stable  Elevated d-dimer Likely due to severe pneumonia with sepsis - clinical suspicion of pulmonary embolism low - discontinued IV heparin in setting of coagulopathy - lower extremities venous duplex ruled out DVT   Diarrhea  C diff negative (low risk). Resolved.   COPD - ongoing tobacco abuse - Cessation counseling done by Dr. Sharon Seller   CAD s/p 4V CABG Denies anginal type chest pain. Management as above. No chest pain but he has fallen-unclear etiology.? Related to  acute infection. Continue aspirin. Started statin.  S/P mitral  valve repair  TTE suggests valve stable   Alcohol use - States that he drinks 2 beers per day. Has been in the hospital for 4 days without withdrawal features. Abstinence counseled  Social issues - Patient states that he lives alone, does not drive and family works and hence he is not able to come for M.D. appointments. He is requesting M.D. close to where he lives. Case management consult - He can be set up to see Dr. Antoine Poche, cardiology in [place] post discharge. - Case management has also arranged for PCP and transport.   DVT prophylaxis Lovenox  Code Status: FULL Family Communication: no family present at time of exam Disposition Plan: DC home 7/16 if nuclear stress test is negative.   Consultants: [facility]  Procedures: none  Antibiotics: Zosyn 7/10 > 7/13 Vanc 7/10 > 7/13 Rocephin 7/13 > 7/14 Oral amoxicillin 7/15 >   Objective:  Filed Vitals:   06/08/15 0636 06/08/15 0837 06/08/15 0900 06/08/15 1436  BP: 123/89 101/80  119/81  Pulse: 77 72  67  Temp: 97.8 F (36.6 C) 97.6 F (36.4 C)  98.1 F (36.7 C)  TempSrc: Oral Oral  Oral  Resp: 20 20  20   Height:      Weight:   76.204 kg (168 lb)   SpO2: 94% 97%  97%    No intake or output data in the 24 hours ending 06/08/15 1507 Exam: General:Pleasant middle-aged male ambulating comfortably in the room. Lungs: Clear to auscultation.  Cardiovascular: S1 and S2 heard, RRR with frequent ectopics, no JVD. 2/6 systolic murmur at apex. No pedal edema. Telemetry: Sinus rhythm with frequent PVCs (seem less). 6 beat nonsustained VT on 7/14 at 2:30 AM. Abdomen: Nontender, nondistended, soft, bowel sounds positive, no rebound, no ascites, no appreciable mass Extremities: No significant cyanosis, clubbing, edema bilateral lower extremities  Data Reviewed: Basic Metabolic Panel:  Recent Labs Lab 06/03/15 1515 06/03/15 1556 06/04/15 1003 06/05/15 0225 06/07/15 0044 06/07/15 0456 06/08/15 0420  NA 132*  --   132* 135 135  --  130*  K 3.4*  --  3.4* 4.3 3.6  --  3.6  CL 98*  --  105 109 104  --  99*  CO2 19*  --  21* 21* 26  --  25  GLUCOSE 130*  --  224* 137* 182*  --  139*  BUN 46*  --  47* 46* 26*  --  16  CREATININE 2.72*  --  1.41* 1.06 0.84  --  0.72  CALCIUM 8.7*  --  8.4* 8.8* 8.3*  --  8.1*  MG  --  1.8  --   --   --  1.7 1.8    CBC:  Recent Labs Lab 06/03/15 1515 06/04/15 0552 06/05/15 0225 06/07/15 0044 06/08/15 0420  WBC 8.9 7.8 10.7* 9.1 11.3*  NEUTROABS 8.2*  --   --   --   --   HGB 13.3 11.3* 11.3* 11.4* 12.0*  HCT 39.5 33.8* 33.6* 33.1* 35.9*  MCV 87.0 85.8 84.4 83.8 83.5  PLT 129* 122* 125* 111* 134*   Liver Function Tests:  Recent Labs Lab 06/03/15 1515 06/04/15 1003 06/05/15 0225 06/07/15 0044  AST 45* 31 38 37  ALT 29 24 24 28   ALKPHOS 32* 24* 72 57  BILITOT 2.1* 1.1 0.8 0.8  PROT 7.7 6.5 6.5 5.9*  ALBUMIN 2.8* 2.0* 1.9* 1.7*    Recent Labs Lab 06/03/15  1515  LIPASE 17*   Coags:  Recent Labs Lab 06/03/15 1515 06/04/15 0552 06/05/15 0225  INR 1.63* 1.56* 1.37    Recent Labs Lab 06/04/15 0552 06/05/15 0225  APTT 36 30    Cardiac Enzymes:  Recent Labs Lab 06/03/15 1515  TROPONINI <0.03    Recent Results (from the past 240 hour(s))  Blood culture (routine x 2)     Status: None   Collection Time: 06/03/15  3:21 PM  Result Value Ref Range Status   Specimen Description BLOOD LEFT WRIST COLLECTED BY RT  Final   Special Requests   Final    BOTTLES DRAWN AEROBIC AND ANAEROBIC AEB=8CC ANA=6CC   Culture NO GROWTH 5 DAYS  Final   Report Status 06/08/2015 FINAL  Final  Blood culture (routine x 2)     Status: None   Collection Time: 06/03/15  3:28 PM  Result Value Ref Range Status   Specimen Description BLOOD RIGHT HAND DRAWN BY RN  Final   Special Requests BOTTLES DRAWN AEROBIC AND ANAEROBIC 8CC EACH  Final   Culture  Setup Time   Final    GRAM POSITIVE COCCI IN PAIRS Gram Stain Report Called to,Read Back By and Verified With:  WOLF C. AT [facility] AT [id number] AT 0844 BY THOMPSON S ANAEROBIC BOTTLE ONLY    Culture   Final    STREPTOCOCCUS PNEUMONIAE Performed at [facility] [place]    Report Status 06/06/2015 FINAL  Final   Organism ID, Bacteria STREPTOCOCCUS PNEUMONIAE  Final      Susceptibility   Streptococcus pneumoniae - MIC*    ERYTHROMYCIN <=0.12 SENSITIVE Sensitive     LEVOFLOXACIN 1 SENSITIVE Sensitive     VANCOMYCIN 0.25 SENSITIVE Sensitive     PENICILLIN <=0.06 SENSITIVE Sensitive     CEFTRIAXONE <=0.12 SENSITIVE Sensitive     * STREPTOCOCCUS PNEUMONIAE  MRSA PCR Screening     Status: None   Collection Time: 06/03/15  9:40 PM  Result Value Ref Range Status   MRSA by PCR NEGATIVE NEGATIVE Final    Comment:        The GeneXpert MRSA Assay (FDA approved for NASAL specimens only), is one component of a comprehensive MRSA colonization surveillance program. It is not intended to diagnose MRSA infection nor to guide or monitor treatment for MRSA infections.   Urine culture     Status: None   Collection Time: 06/04/15  1:45 AM  Result Value Ref Range Status   Specimen Description URINE, RANDOM  Final   Special Requests NONE  Final   Culture NO GROWTH 1 DAY  Final   Report Status 06/05/2015 FINAL  Final  Clostridium Difficile by PCR (not at [facility])     Status: None   Collection Time: 06/04/15  1:46 AM  Result Value Ref Range Status   C difficile by pcr NEGATIVE NEGATIVE Final     Studies:   Recent x-ray studies have been reviewed in detail by the Attending Physician  Scheduled Meds:  Scheduled Meds: . amoxicillin  500 mg Oral TID  . aspirin EC  81 mg Oral Daily  . atorvastatin  40 mg Oral q1800  . carvedilol  6.25 mg Oral BID WC  . dextromethorphan-guaiFENesin  1 tablet Oral BID  . enoxaparin (LOVENOX) injection  40 mg Subcutaneous Q24H  . folic acid  1 mg Oral Daily  . furosemide  20 mg Oral Daily  . lisinopril  10 mg Oral Daily  . magnesium oxide  400 mg Oral  BID  . multivitamin with  minerals  1 tablet Oral Daily  . spironolactone  12.5 mg Oral Daily  . thiamine  100 mg Oral Daily    Time spent on care of this patient: 35 mins   Jolane Bankhead, MD, FACP, FHM. [facility] Pager [phone]  If 7PM-7AM, please contact night-coverage [email or web address] Password [email or web address] 06/08/2015, 3:06 PM   LOS: 5 days     '

## 2015-06-09 ENCOUNTER — Inpatient Hospital Stay (HOSPITAL_COMMUNITY): Payer: BLUE CROSS/BLUE SHIELD

## 2015-06-09 DIAGNOSIS — R079 Chest pain, unspecified: Secondary | ICD-10-CM

## 2015-06-09 DIAGNOSIS — N179 Acute kidney failure, unspecified: Secondary | ICD-10-CM

## 2015-06-09 LAB — BASIC METABOLIC PANEL
Anion gap: 4 — ABNORMAL LOW (ref 5–15)
BUN: 13 mg/dL (ref 6–20)
CO2: 28 mmol/L (ref 22–32)
Calcium: 8.1 mg/dL — ABNORMAL LOW (ref 8.9–10.3)
Chloride: 101 mmol/L (ref 101–111)
Creatinine, Ser: 0.81 mg/dL (ref 0.61–1.24)
Glucose, Bld: 156 mg/dL — ABNORMAL HIGH (ref 65–99)
POTASSIUM: 4 mmol/L (ref 3.5–5.1)
SODIUM: 133 mmol/L — AB (ref 135–145)

## 2015-06-09 MED ORDER — TECHNETIUM TC 99M SESTAMIBI - CARDIOLITE
30.0000 | Freq: Once | INTRAVENOUS | Status: AC | PRN
  Administered 2015-06-09: 30 via INTRAVENOUS

## 2015-06-09 MED ORDER — ATORVASTATIN CALCIUM 40 MG PO TABS: 40.0000 mg | ORAL_TABLET | Freq: Every day | ORAL | Status: AC

## 2015-06-09 MED ORDER — ADULT MULTIVITAMIN W/MINERALS CH: 1.0000 | ORAL_TABLET | Freq: Every day | ORAL | Status: AC

## 2015-06-09 MED ORDER — REGADENOSON 0.4 MG/5ML IV SOLN
INTRAVENOUS | Status: AC
  Filled 2015-06-09: qty 5

## 2015-06-09 MED ORDER — SPIRONOLACTONE 25 MG PO TABS: 12.5000 mg | ORAL_TABLET | Freq: Every day | ORAL | Status: AC

## 2015-06-09 MED ORDER — CARVEDILOL 6.25 MG PO TABS: 6.2500 mg | ORAL_TABLET | Freq: Two times a day (BID) | ORAL | Status: DC

## 2015-06-09 MED ORDER — ASPIRIN 81 MG PO TBEC: 81.0000 mg | DELAYED_RELEASE_TABLET | Freq: Every day | ORAL | Status: AC

## 2015-06-09 MED ORDER — THIAMINE HCL 100 MG PO TABS: 100.0000 mg | ORAL_TABLET | Freq: Every day | ORAL | Status: AC

## 2015-06-09 MED ORDER — FUROSEMIDE 20 MG PO TABS: 20.0000 mg | ORAL_TABLET | Freq: Every day | ORAL | Status: DC

## 2015-06-09 MED ORDER — LISINOPRIL 10 MG PO TABS: 10.0000 mg | ORAL_TABLET | Freq: Every day | ORAL | Status: AC

## 2015-06-09 MED ORDER — AMOXICILLIN 500 MG PO CAPS: 500.0000 mg | ORAL_CAPSULE | Freq: Three times a day (TID) | ORAL | Status: DC

## 2015-06-09 MED ORDER — FOLIC ACID 1 MG PO TABS: 1.0000 mg | ORAL_TABLET | Freq: Every day | ORAL | Status: AC

## 2015-06-09 MED ORDER — TECHNETIUM TC 99M SESTAMIBI GENERIC - CARDIOLITE
10.0000 | Freq: Once | INTRAVENOUS | Status: AC | PRN
  Administered 2015-06-09: 10 via INTRAVENOUS

## 2015-06-09 MED ORDER — MAGNESIUM OXIDE 400 (241.3 MG) MG PO TABS: 400.0000 mg | ORAL_TABLET | Freq: Two times a day (BID) | ORAL | Status: DC

## 2015-06-09 MED ORDER — REGADENOSON 0.4 MG/5ML IV SOLN
0.4000 mg | Freq: Once | INTRAVENOUS | Status: AC
  Administered 2015-06-09: 0.4 mg via INTRAVENOUS
  Filled 2015-06-09: qty 5

## 2015-06-09 NOTE — Progress Notes (Signed)
Pt transported off unit for stress test. Dionne BucyP. Amo Kanika Bungert RN

## 2015-06-09 NOTE — Progress Notes (Signed)
SUBJECTIVE:  No complaints  OBJECTIVE:   Vitals:   Filed Vitals:   06/09/15 0916 06/09/15 0923 06/09/15 0924 06/09/15 0928  BP: 143/94  132/68   Pulse: 68 91 101 89  Temp:      TempSrc:      Resp:      Height:      Weight:      SpO2:       I&O's:   Intake/Output Summary (Last 24 hours) at 06/09/15 0944 Last data filed at 06/09/15 [id number]  Gross per 24 hour  Intake   1320 ml  Output   2220 ml  Net   -900 ml   TELEMETRY: Reviewed telemetry pt in NSR:     PHYSICAL EXAM General: Well developed, well nourished, in no acute distress Head: Eyes PERRLA, No xanthomas.   Normal cephalic and atramatic  Lungs:   Clear bilaterally to auscultation and percussion. Heart:   HRRR S1 S2 Pulses are 2+ & equal. Abdomen: Bowel sounds are positive, abdomen soft and non-tender without masses Extremities:   No clubbing, cyanosis or edema.  DP +1 Neuro: Alert and oriented X 3. Psych:  Good affect, responds appropriately   LABS: Basic Metabolic Panel:  Recent Labs  29/56/2107/14/16 0456 06/08/15 0420 06/09/15 0416  NA  --  130* 133*  K  --  3.6 4.0  CL  --  99* 101  CO2  --  25 28  GLUCOSE  --  139* 156*  BUN  --  16 13  CREATININE  --  0.72 0.81  CALCIUM  --  8.1* 8.1*  MG 1.7 1.8  --    Liver Function Tests:  Recent Labs  06/07/15 0044  AST 37  ALT 28  ALKPHOS 57  BILITOT 0.8  PROT 5.9*  ALBUMIN 1.7*   No results for input(s): LIPASE, AMYLASE in the last 72 hours. CBC:  Recent Labs  06/07/15 0044 06/08/15 0420  WBC 9.1 11.3*  HGB 11.4* 12.0*  HCT 33.1* 35.9*  MCV 83.8 83.5  PLT 111* 134*   Cardiac Enzymes: No results for input(s): CKTOTAL, CKMB, CKMBINDEX, TROPONINI in the last 72 hours. BNP: Invalid input(s): POCBNP D-Dimer: No results for input(s): DDIMER in the last 72 hours. Hemoglobin A1C: No results for input(s): HGBA1C in the last 72 hours. Fasting Lipid Panel: No results for input(s): CHOL, HDL, LDLCALC, TRIG, CHOLHDL, LDLDIRECT in the last 72  hours. Thyroid Function Tests: No results for input(s): TSH, T4TOTAL, T3FREE, THYROIDAB in the last 72 hours.  Invalid input(s): FREET3 Anemia Panel:  Recent Labs  06/07/15 0044  VITAMINB12 3040*  FOLATE 19.7  FERRITIN 84  TIBC 276  IRON 24*  RETICCTPCT 1.1   Coag Panel:   Lab Results  Component Value Date   INR 1.37 06/05/2015   INR 1.56* 06/04/2015   INR 1.63* 06/03/2015    RADIOLOGY: Nm Pulmonary Perf And Vent  06/04/2015   CLINICAL DATA:  64 year old male with shortness of breath, COPD  EXAM: NUCLEAR MEDICINE VENTILATION - PERFUSION LUNG SCAN  TECHNIQUE: Ventilation images were obtained in multiple projections using inhaled aerosol Tc-4512m DTPA. Perfusion images were obtained in multiple projections after intravenous i Switch to in in PICC njection of Tc-3912m MAA.  RADIOPHARMACEUTICALS:  36.9 mCi of Technetium-4312m DTPA aerosol inhalation and 5.69 mCi Technetium-7112m MAA IV  COMPARISON:  Chest radiograph dated 06/03/2015  FINDINGS: Ventilation: There are central deposition of the radiotracer compatible with obstructive airway disease. Large area of ventilation defect involving the entire  left lower lobe as well as subsegmental left upper lobe  Perfusion: There is perfusion defect predominantly involving the superior segment of the left upper lobe with residual profusion at the basal segments of the left lower lobe.  On the chest radiograph an area of opacity is noted in the left mid lung field.  IMPRESSION: Large perfusion and ventilation defect involving the left lower lobe. Overall the ventilation defect is larger than the perfusion defect and likely correspond to the airspace opacity seen on the radiograph. Findings are intermediate for pulmonary embolism. Clinical correlation is recommended.   Electronically Signed   By: Elgie Collard M.D.   On: 06/04/2015 01:41   Dg Chest Port 1 View  06/04/2015   CLINICAL DATA:  Shortness of breath.  EXAM: PORTABLE CHEST - 1 VIEW  COMPARISON:   06/03/2015.  FINDINGS: Mediastinum hilar structures normal. Prior CABG. Cardiomegaly with normal pulmonary vascularity. Left lower lobe lobe atelectasis/ infiltrate with left pleural effusion. No pneumothorax.  IMPRESSION: 1. Persistent left lower lobe atelectasis/infiltrate. Small left pleural effusion. 2. Prior CABG.  Cardiomegaly with normal pulmonary vascularity.   Electronically Signed   By: Maisie Fus  Register   On: 06/04/2015 07:12   Dg Chest Portable 1 View  06/03/2015   CLINICAL DATA:  Patient with shortness of breath since Friday. Prior history of pneumonia.  EXAM: PORTABLE CHEST - 1 VIEW  COMPARISON:  Chest radiograph 04/06/2014.  FINDINGS: Stable enlarged cardiac and mediastinal contours. Consolidative opacities within the left mid and lower lung. No pleural effusion or pneumothorax.  IMPRESSION: Left mid lung consolidative opacities, concerning for pneumonia. Followup PA and lateral chest X-ray is recommended in 3-4 weeks following trial of antibiotic therapy to ensure resolution and exclude underlying malignancy.   Electronically Signed   By: Annia Belt M.D.   On: 06/03/2015 15:30   Assessment/Plan   1. Strep pneumoniae CAP: Being treated by the hospitalist. This prompted his admission.  2. AKI: Rapid improvement with hydration, suspect pre-renal. Creatinine stable, back on low dose Lasix.  3. Chronic systolic CHF: Ischemic cardiomyopathy. Echo with EF 20-25%, down from 40-45% in 11/13. He is not volume overloaded on exam. He has not been taking any medications at home. No dyspnea currently. Uncertain of cause of fall in EF: may have happened over time off cardiac meds but cannot rule out progressive CAD. No active chest pain.  - Continue Coreg 6.25 mg bid, lisinopril 10 mg daily, Lasix 20 mg daily and spironolactone 12.5 mg daily.  - Lexiscan Cardiolite today to assess for significant ischemia.  4. CAD: s/p CABG. No chest pain but EF has fallen. Plan Cardiolite as above.  Continue ASA 81 and and statin (atorvastatin 40 daily).  5. He does not have a ride to get from [place] to [place] so had not been seeing cardiology anymore. He probably will need to be set up to see Dr Antoine Poche in [place] after discharge.        Quintella Reichert, MD  06/09/2015  9:44 AM

## 2015-06-09 NOTE — Discharge Summary (Signed)
Physician Discharge Summary  Miguel York ZOX:[id number] DOB: [birth year] DOA: 06/03/2015  PCP: No PCP Per Patient  Admit date: 06/03/2015 Discharge date: 06/09/2015  Time spent: Greater than 30 minutes  Recommendations for Outpatient Follow-up:  1. PCP in 1-2 weeks. To be seen with repeat labs (CBC & BMP). Care management has provided patient with resources to find new PCP. Recommend repeat chest x-ray in 3-4 weeks to ensure resolution of pneumonia findings. This was discussed with patient and he verbalizes understanding. 2. Dr. Angelica Pou, Cardiology in their [place] office: MDs office will arrange follow-up.  Discharge Diagnoses:  Active Problems:   Cardiomyopathy   Coronary artery disease   S/P CABG x 4   S/P mitral valve repair   Chronic systolic heart failure   Community acquired pneumonia   Sepsis   Acute renal failure   Healthcare-associated pneumonia   HCAP (healthcare-associated pneumonia)   Positive D dimer   Severe sepsis   Bacteremia   Systolic CHF, chronic   V tach   Hyponatremia   Hypokalemia   Coagulopathy   Elevated d-dimer   CAP (community acquired pneumonia)   Discharge Condition: Improved & Stable  Diet recommendation: Heart healthy diet.  Filed Weights   06/03/15 2100 06/08/15 0900 06/09/15 0448  Weight: 75.7 kg (166 lb 14.2 oz) 76.204 kg (168 lb) 74.934 kg (165 lb 3.2 oz)    History of present illness:  64 y.o. male, former smoker w/ a hx of CAD, CABG, ischemic cardiomyopathy, chronic systolic CHF, s/p mitral valve repair, COPD, ongoing tobacco and alcohol abuse, hyponatremia, iron deficiency anemia and HLD who had not seen a physician for almost 3 years , noncompliant with medications, admitted to [facility] on 06/03/15 with a 3 day history of increasing dyspnea, cough productive of yellow sputum with blood streaks, subjective fever, and chest pain with coughing. He was initially admitted to stepdown unit for severe sepsis due to LLL pneumococcal  CAP, bacteremia and acute kidney injury. After improvement and stabilization, transferred to floor on 7/13. Cardiology consulted 7/14 for cardiomyopathy. Social issues: Patient lives alone, does not drive and has no family members to bring him to M.D. visits.  Hospital Course:   Severe Sepsis (lactic acidosis) due to LLL Strep pneum CAP w/ confirmed bacteremia  Sepsis physiology resolved. Initially treated with IV Zosyn, then narrowed to Rocephin. Has completed approximately 4 days worth of IV antibiotics on 4/15. One of 2 blood cultures was positive for Streptococcus pneumoniae. 2 of 2 blood cultures negative to date. Urine culture negative. Urine Legionella antigen negative. Urine pneumococcal antigen positive. After discussing with infectious disease M.D. on call on 7/15, changed antibiotics to oral amoxicillin to complete total 10 days treatment.  Acute renal failure crt 2.72 at presentation - resolved w/ volume resuscitation   Ventricular ectopy Likely due to acute stress and electrolyte abnormalities - TTE notes EF 20-25%  - resumed carvedilol (on Zebeta previously) this admit and increased to 6.25 twice a day. Lisinopril increased to 10 MG daily. - Although previous notes suggest V. tach, did not see any obvious episodes of ventricular tachycardia on telemetry (reviewed by cardiology too). However patient does have frequent PVCs. - Replace potassium and maintain >4 and magnesium >2  Chronic systolic systolic CHF/ischemic cardiomyopathy/LVEF 20-25 percent this admission EF had reportedly improved to 45-50% by Nov 2013 - TTE this admit notes EF 20-25% w/ diffuse hypokinesis - perhaps this is due to his acute illness, or indicative of re-accumulated CAD  - Cardiology consultation appreciated.  Increasing carvedilol and lisinopril. - Worsening EF may be due to progressive CAD versus untreated hypertension - Continue carvedilol, lisinopril, Lasix and Aldactone added - Cardiology  follow-up appreciated: Patient underwent nuclear stress-results as below. Discussed stress test results with Mrs. Joni Reining, NP who has reviewed the stress test results with her cardiology M.D. and indicated that patient could be discharged home. She will kindly arrange outpatient follow-up with cardiology at the [facility] office.  Hyponatremia  Likely due to hypovolemia - resolved with volume resuscitation  Hypokalemia  Likely due to poor intake in setting of acute illness - will replace to keep >4  Coagulopathy INR 1.63 at presentation - likely due to an element of shock liver - coags have normalized   Normocytic anemia  Suggestive of iron deficiency. Will need outpatient follow-up and evaluation as deemed necessary.  Thrombocytopenia - Stable  Elevated d-dimer Likely due to severe pneumonia with sepsis - clinical suspicion of pulmonary embolism low - discontinued IV heparin in setting of coagulopathy - lower extremities venous duplex ruled out DVT  VQ scan showed a large perfusion and ventilation defect involving left lower lobe with the ventilation defect larger than the perfusion defect and likely corresponding to ASD (left lower lobe pneumonia) seen on chest x-ray.  Diarrhea  C diff negative (low risk). Resolved.   COPD - ongoing tobacco abuse - Cessation counseling done by Dr. Sharon Seller   CAD s/p 4V CABG Denies anginal type chest pain. Management as above. No chest pain but EF has fallen-unclear etiology.? Related to acute infection. Continue aspirin. Started statin.  S/P mitral valve repair  TTE suggests valve stable   Alcohol use - States that he drinks 2 beers per day. Not withdrawal features. Abstinence counseled  Social issues - Patient states that he lives alone, does not drive and family works and hence he is not able to come for M.D. appointments. He is requesting M.D. close to where he lives. Case management consulted and patient was provided resources. -  He can be set up to see Dr. Antoine Poche, cardiology in [place] post discharge. - Case management has also arranged for PCP and transport.    Consultants: [facility] Cardiology  Discharge Exam:  Complaints:  Patient denies complaints. No chest pain, cough, dyspnea or palpitations.  Filed Vitals:   06/09/15 0924 06/09/15 0928 06/09/15 1112 06/09/15 1316  BP: 132/68  122/63 120/75  Pulse: 101 89 72 69  Temp:   97.6 F (36.4 C) 97.3 F (36.3 C)  TempSrc:   Oral Oral  Resp:   18 16  Height:      Weight:      SpO2:   97% 97%    General:Pleasant middle-aged male ambulating comfortably in the room. Lungs: Clear to auscultation.  Cardiovascular: S1 and S2 heard, RRR with in frequent ectopics, no JVD. 2/6 systolic murmur at apex. No pedal edema. Telemetry: Sinus rhythm with occasional PVCs. Abdomen: Nontender, nondistended, soft, bowel sounds positive, no rebound, no ascites, no appreciable mass Extremities: No significant cyanosis, clubbing, edema bilateral lower extremities  Discharge Instructions      Discharge Instructions    (HEART FAILURE PATIENTS) Call MD:  Anytime you have any of the following symptoms: 1) 3 pound weight gain in 24 hours or 5 pounds in 1 week 2) shortness of breath, with or without a dry hacking cough 3) swelling in the hands, feet or stomach 4) if you have to sleep on extra pillows at night in order to breathe.    Complete  by:  As directed      Call MD for:  difficulty breathing, headache or visual disturbances    Complete by:  As directed      Call MD for:  extreme fatigue    Complete by:  As directed      Call MD for:  persistant dizziness or light-headedness    Complete by:  As directed      Call MD for:  persistant nausea and vomiting    Complete by:  As directed      Call MD for:  severe uncontrolled pain    Complete by:  As directed      Call MD for:  temperature >100.4    Complete by:  As directed      Diet - low sodium heart healthy    Complete by:   As directed      Increase activity slowly    Complete by:  As directed             Medication List    STOP taking these medications        albuterol (2.5 MG/3ML) 0.083% nebulizer solution  Commonly known as:  PROVENTIL     albuterol 108 (90 BASE) MCG/ACT inhaler  Commonly known as:  PROVENTIL HFA;VENTOLIN HFA      TAKE these medications        ADVIL 200 MG tablet  Generic drug:  ibuprofen  Take 400 mg by mouth every 6 (six) hours as needed for mild pain or moderate pain.     amoxicillin 500 MG capsule  Commonly known as:  AMOXIL  Take 1 capsule (500 mg total) by mouth 3 (three) times daily.     aspirin 81 MG EC tablet  Take 1 tablet (81 mg total) by mouth daily.     atorvastatin 40 MG tablet  Commonly known as:  LIPITOR  Take 1 tablet (40 mg total) by mouth daily at 6 PM.     carvedilol 6.25 MG tablet  Commonly known as:  COREG  Take 1 tablet (6.25 mg total) by mouth 2 (two) times daily with a meal.     folic acid 1 MG tablet  Commonly known as:  FOLVITE  Take 1 tablet (1 mg total) by mouth daily.     furosemide 20 MG tablet  Commonly known as:  LASIX  Take 1 tablet (20 mg total) by mouth daily.     lisinopril 10 MG tablet  Commonly known as:  PRINIVIL,ZESTRIL  Take 1 tablet (10 mg total) by mouth daily.     magnesium oxide 400 (241.3 MG) MG tablet  Commonly known as:  MAG-OX  Take 1 tablet (400 mg total) by mouth 2 (two) times daily.     multivitamin with minerals Tabs tablet  Take 1 tablet by mouth daily.     spironolactone 25 MG tablet  Commonly known as:  ALDACTONE  Take 0.5 tablets (12.5 mg total) by mouth daily.     THERAFLU FLU/COLD PO  Take 15 mLs by mouth daily as needed (for cough).     thiamine 100 MG tablet  Take 1 tablet (100 mg total) by mouth daily.       Follow-up Information    Follow up with [facility].   Why:  Please use phone number provided by Case Management. Please make appointment to be seen in 1 - 2 weeks with  repeat labs (CBC & BMP).   Contact information:   Please contact service  to obtain Primary Care Provider within [facility] that will accept your insurance      Follow up with Rollene Rotunda, MD.   Specialty:  Cardiology   Why:  MD's office will call with appointment. Please call if you don't hear from them in 2-3 days.   Contact information:   [address] [place] [address] [phone]        The results of significant diagnostics from this hospitalization (including imaging, microbiology, ancillary and laboratory) are listed below for reference.    Significant Diagnostic Studies: Nm Myocar Multi W/spect W/wall Motion / Ef  06/09/2015   CLINICAL DATA:  Chest pain  EXAM: MYOCARDIAL IMAGING WITH SPECT (REST AND PHARMACOLOGIC-STRESS)  GATED LEFT VENTRICULAR WALL MOTION STUDY  LEFT VENTRICULAR EJECTION FRACTION  TECHNIQUE: Standard myocardial SPECT imaging was performed after resting intravenous injection of 10 mCi Tc-57m sestamibi. Subsequently, intravenous infusion of Lexiscan was performed under the supervision of the Cardiology staff. At peak effect of the drug, 30 mCi Tc-46m sestamibi was injected intravenously and standard myocardial SPECT imaging was performed. Quantitative gated imaging was also performed to evaluate left ventricular wall motion, and estimate left ventricular ejection fraction.  COMPARISON:  None.  FINDINGS: Perfusion: No decreased activity in the left ventricle on stress imaging to suggest reversible ischemia or infarction. Small fixed defect is identified within the a focal segment of the lateral wall.  Wall Motion: There is significantly diminished motion involving the septal wall and anterior wall.  Left Ventricular Ejection Fraction: 38 %  End diastolic volume 159 ml  End systolic volume 99 ml  IMPRESSION: 1. No reversible ischemia. Small fixed defect is identified within the apical segment of the lateral wall.  2. Marked hypokinesis involves the anterior wall  and septum.  3. Left ventricular ejection fraction 38%  4. Intermediate-risk stress test findings*.  *2012 Appropriate Use Criteria for Coronary Revascularization Focused Update: J Am Coll Cardiol. 2012;59(9):857-881. http://content.dementiazones.com.aspx?articleid=1201161   Electronically Signed   By: Signa Kell M.D.   On: 06/09/2015 12:44   Nm Pulmonary Perf And Vent  06/04/2015   CLINICAL DATA:  64 year old male with shortness of breath, COPD  EXAM: NUCLEAR MEDICINE VENTILATION - PERFUSION LUNG SCAN  TECHNIQUE: Ventilation images were obtained in multiple projections using inhaled aerosol Tc-72m DTPA. Perfusion images were obtained in multiple projections after intravenous i Switch to in in PICC njection of Tc-49m MAA.  RADIOPHARMACEUTICALS:  36.9 mCi of Technetium-16m DTPA aerosol inhalation and 5.69 mCi Technetium-23m MAA IV  COMPARISON:  Chest radiograph dated 06/03/2015  FINDINGS: Ventilation: There are central deposition of the radiotracer compatible with obstructive airway disease. Large area of ventilation defect involving the entire left lower lobe as well as subsegmental left upper lobe  Perfusion: There is perfusion defect predominantly involving the superior segment of the left upper lobe with residual profusion at the basal segments of the left lower lobe.  On the chest radiograph an area of opacity is noted in the left mid lung field.  IMPRESSION: Large perfusion and ventilation defect involving the left lower lobe. Overall the ventilation defect is larger than the perfusion defect and likely correspond to the airspace opacity seen on the radiograph. Findings are intermediate for pulmonary embolism. Clinical correlation is recommended.   Electronically Signed   By: Elgie Collard M.D.   On: 06/04/2015 01:41   Dg Chest Port 1 View  06/04/2015   CLINICAL DATA:  Shortness of breath.  EXAM: PORTABLE CHEST - 1 VIEW  COMPARISON:  06/03/2015.  FINDINGS: Mediastinum hilar structures normal.  Prior CABG. Cardiomegaly with normal pulmonary vascularity. Left lower lobe lobe atelectasis/ infiltrate with left pleural effusion. No pneumothorax.  IMPRESSION: 1. Persistent left lower lobe atelectasis/infiltrate. Small left pleural effusion. 2. Prior CABG.  Cardiomegaly with normal pulmonary vascularity.   Electronically Signed   By: Maisie Fushomas  Register   On: 06/04/2015 07:12   Dg Chest Portable 1 View  06/03/2015   CLINICAL DATA:  Patient with shortness of breath since Friday. Prior history of pneumonia.  EXAM: PORTABLE CHEST - 1 VIEW  COMPARISON:  Chest radiograph 04/06/2014.  FINDINGS: Stable enlarged cardiac and mediastinal contours. Consolidative opacities within the left mid and lower lung. No pleural effusion or pneumothorax.  IMPRESSION: Left mid lung consolidative opacities, concerning for pneumonia. Followup PA and lateral chest X-ray is recommended in 3-4 weeks following trial of antibiotic therapy to ensure resolution and exclude underlying malignancy.   Electronically Signed   By: Annia Beltrew  Davis M.D.   On: 06/03/2015 15:30    Microbiology: Recent Results (from the past 240 hour(s))  Blood culture (routine x 2)     Status: None   Collection Time: 06/03/15  3:21 PM  Result Value Ref Range Status   Specimen Description BLOOD LEFT WRIST COLLECTED BY RT  Final   Special Requests   Final    BOTTLES DRAWN AEROBIC AND ANAEROBIC AEB=8CC ANA=6CC   Culture NO GROWTH 5 DAYS  Final   Report Status 06/08/2015 FINAL  Final  Blood culture (routine x 2)     Status: None   Collection Time: 06/03/15  3:28 PM  Result Value Ref Range Status   Specimen Description BLOOD RIGHT HAND DRAWN BY RN  Final   Special Requests BOTTLES DRAWN AEROBIC AND ANAEROBIC 8CC EACH  Final   Culture  Setup Time   Final    GRAM POSITIVE COCCI IN PAIRS Gram Stain Report Called to,Read Back By and Verified With: WOLF C. AT [facility] AT [id number] AT 0844 BY THOMPSON S ANAEROBIC BOTTLE ONLY    Culture   Final    STREPTOCOCCUS  PNEUMONIAE [facility]    Report Status 06/06/2015 FINAL  Final   Organism ID, Bacteria STREPTOCOCCUS PNEUMONIAE  Final      Susceptibility   Streptococcus pneumoniae - MIC*    ERYTHROMYCIN <=0.12 SENSITIVE Sensitive     LEVOFLOXACIN 1 SENSITIVE Sensitive     VANCOMYCIN 0.25 SENSITIVE Sensitive     PENICILLIN <=0.06 SENSITIVE Sensitive     CEFTRIAXONE <=0.12 SENSITIVE Sensitive     * STREPTOCOCCUS PNEUMONIAE  MRSA PCR Screening     Status: None   Collection Time: 06/03/15  9:40 PM  Result Value Ref Range Status   MRSA by PCR NEGATIVE NEGATIVE Final    Comment:        The GeneXpert MRSA Assay (FDA approved for NASAL specimens only), is one component of a comprehensive MRSA colonization surveillance program. It is not intended to diagnose MRSA infection nor to guide or monitor treatment for MRSA infections.   Urine culture     Status: None   Collection Time: 06/04/15  1:45 AM  Result Value Ref Range Status   Specimen Description URINE, RANDOM  Final   Special Requests NONE  Final   Culture NO GROWTH 1 DAY  Final   Report Status 06/05/2015 FINAL  Final  Clostridium Difficile by PCR (not at [facility])     Status: None   Collection Time: 06/04/15  1:46 AM  Result Value  Ref Range Status   C difficile by pcr NEGATIVE NEGATIVE Final     Labs: Basic Metabolic Panel:  Recent Labs Lab 06/03/15 1556 06/04/15 1003 06/05/15 0225 06/07/15 0044 06/07/15 0456 06/08/15 0420 06/09/15 0416  NA  --  132* 135 135  --  130* 133*  K  --  3.4* 4.3 3.6  --  3.6 4.0  CL  --  105 109 104  --  99* 101  CO2  --  21* 21* 26  --  25 28  GLUCOSE  --  224* 137* 182*  --  139* 156*  BUN  --  47* 46* 26*  --  16 13  CREATININE  --  1.41* 1.06 0.84  --  0.72 0.81  CALCIUM  --  8.4* 8.8* 8.3*  --  8.1* 8.1*  MG 1.8  --   --   --  1.7 1.8  --    Liver Function Tests:  Recent Labs Lab 06/03/15 1515 06/04/15 1003 06/05/15 0225 06/07/15 0044  AST 45* 31 38 37  ALT 29 24  24 28   ALKPHOS 32* 24* 72 57  BILITOT 2.1* 1.1 0.8 0.8  PROT 7.7 6.5 6.5 5.9*  ALBUMIN 2.8* 2.0* 1.9* 1.7*    Recent Labs Lab 06/03/15 1515  LIPASE 17*   No results for input(s): AMMONIA in the last 168 hours. CBC:  Recent Labs Lab 06/03/15 1515 06/04/15 0552 06/05/15 0225 06/07/15 0044 06/08/15 0420  WBC 8.9 7.8 10.7* 9.1 11.3*  NEUTROABS 8.2*  --   --   --   --   HGB 13.3 11.3* 11.3* 11.4* 12.0*  HCT 39.5 33.8* 33.6* 33.1* 35.9*  MCV 87.0 85.8 84.4 83.8 83.5  PLT 129* 122* 125* 111* 134*   Cardiac Enzymes:  Recent Labs Lab 06/03/15 1515  TROPONINI <0.03   BNP: BNP (last 3 results)  Recent Labs  06/03/15 1515  BNP 551.0*    ProBNP (last 3 results) No results for input(s): PROBNP in the last 8760 hours.  CBG: No results for input(s): GLUCAP in the last 168 hours.   Additional labs: 1. ABG on admission: PH 7.48, PCO2 23, PaO2 76, bicarbonate 17 and oxygen saturation 95.3 2. Anemia panel: Iron 24, TIBC 276, saturation ratio 9, ferritin 84 folate 19.7 and B12: 3040. Reticulocyte: 44 3. Lactate on admission 6.69-subsequently normalized to 1.9 4. Admission BMP: Sodium 132, percussion 3.4, chloride 98, bicarbonate 19, BUN 46, creatinine 2.72 5. HIV antibody screen: Non-reactive. 6. Urine Legionella antigen: Negative 7. Urine pneumococcal antigen: Positive 8. Bilateral lower extremity venous Dopplers 06/04/15: Summary: No evidence of deep vein or superficial thrombosis involving the right lower extremity and left lower extremity. 9. 2-D echo 06/05/15: Study Conclusions  - Left ventricle: The cavity size was severely dilated. Wall thickness was normal. Systolic function was severely reduced. The estimated ejection fraction was in the range of 20% to 25%. Diffuse hypokinesis. - Aortic valve: There was trivial regurgitation. - Mitral valve: S/P repair with mild residual MR. Valve area by continuity equation (using LVOT flow): 0.88 cm^2. - Left  atrium: The atrium was moderately dilated. - Right atrium: The atrium was moderately dilated. - Atrial septum: No defect or patent foramen ovale was identified.   Signed:  Marcellus Scott, MD, FACP, FHM. [facility] Pager [phone]  If 7PM-7AM, please contact night-coverage [email or web address] Password [email or web address] 06/09/2015, 4:10 PM

## 2015-06-09 NOTE — Discharge Instructions (Signed)
Pneumonia °Pneumonia is an infection of the lungs.  °CAUSES °Pneumonia may be caused by bacteria or a virus. Usually, these infections are caused by breathing infectious particles into the lungs (respiratory tract). °SIGNS AND SYMPTOMS  °· Cough. °· Fever. °· Chest pain. °· Increased rate of breathing. °· Wheezing. °· Mucus production. °DIAGNOSIS  °If you have the common symptoms of pneumonia, your health care provider will typically confirm the diagnosis with a chest X-ray. The X-ray will show an abnormality in the lung (pulmonary infiltrate) if you have pneumonia. Other tests of your blood, urine, or sputum may be done to find the specific cause of your pneumonia. Your health care provider may also do tests (blood gases or pulse oximetry) to see how well your lungs are working. °TREATMENT  °Some forms of pneumonia may be spread to other people when you cough or sneeze. You may be asked to wear a mask before and during your exam. Pneumonia that is caused by bacteria is treated with antibiotic medicine. Pneumonia that is caused by the influenza virus may be treated with an antiviral medicine. Most other viral infections must run their course. These infections will not respond to antibiotics.  °HOME CARE INSTRUCTIONS  °· Cough suppressants may be used if you are losing too much rest. However, coughing protects you by clearing your lungs. You should avoid using cough suppressants if you can. °· Your health care provider may have prescribed medicine if he or she thinks your pneumonia is caused by bacteria or influenza. Finish your medicine even if you start to feel better. °· Your health care provider may also prescribe an expectorant. This loosens the mucus to be coughed up. °· Take medicines only as directed by your health care provider. °· Do not smoke. Smoking is a common cause of bronchitis and can contribute to pneumonia. If you are a smoker and continue to smoke, your cough may last several weeks after your  pneumonia has cleared. °· A cold steam vaporizer or humidifier in your room or home may help loosen mucus. °· Coughing is often worse at night. Sleeping in a semi-upright position in a recliner or using a couple pillows under your head will help with this. °· Get rest as you feel it is needed. Your body will usually let you know when you need to rest. °PREVENTION °A pneumococcal shot (vaccine) is available to prevent a common bacterial cause of pneumonia. This is usually suggested for: °· People over 65 years old. °· Patients on chemotherapy. °· People with chronic lung problems, such as bronchitis or emphysema. °· People with immune system problems. °If you are over 65 or have a high risk condition, you may receive the pneumococcal vaccine if you have not received it before. In some countries, a routine influenza vaccine is also recommended. This vaccine can help prevent some cases of pneumonia. You may be offered the influenza vaccine as part of your care. °If you smoke, it is time to quit. You may receive instructions on how to stop smoking. Your health care provider can provide medicines and counseling to help you quit. °SEEK MEDICAL CARE IF: °You have a fever. °SEEK IMMEDIATE MEDICAL CARE IF:  °· Your illness becomes worse. This is especially true if you are elderly or weakened from any other disease. °· You cannot control your cough with suppressants and are losing sleep. °· You begin coughing up blood. °· You develop pain which is getting worse or is uncontrolled with medicines. °· Any of the symptoms   which initially brought you in for treatment are getting worse rather than better.  You develop shortness of breath or chest pain. MAKE SURE YOU:   Understand these instructions.  Will watch your condition.  Will get help right away if you are not doing well or get worse. Document Released: 11/10/2005 Document Revised: 03/27/2014 Document Reviewed: 01/30/2011 [facility] Patient Information 2015  [place]. This information is not intended to replace advice given to you by your health care provider. Make sure you discuss any questions you have with your health care provider.  Bacteremia Bacteremia occurs when bacteria get in your blood. Normal blood does not usually have bacteria. Bacteremia is one way infections can spread from one part of the body to another. CAUSES   Causes may include anything that allows bacteria to get into the body. Examples are:  Catheters.  Intravenous (IV) access tubes.  Cuts or scrapes of the skin.  Temporary bacteremia may occur during dental procedures, while brushing your teeth, or during a bowel movement. This rarely causes any symptoms or medical problems.  Bacteria may also get in the bloodstream as a complication of a bacterial infection elsewhere. This includes infected wounds and bacterial infections of the:  Lungs (pneumonia).  Kidneys (pyelonephritis).  Intestines (enteritis, colitis).  Organs in the abdomen (appendicitis, cholecystitis, diverticulitis). SYMPTOMS  The body is usually able to clear small numbers of bacteria out of the blood quickly. Brief bacteremia usually does not cause problems.   Problems can occur if the bacteria start to grow in number or spread to other parts of the body. If the bacteria start growing, you may develop:  Chills.  Fever.  Nausea.  Vomiting.  Sweating.  Lightheadedness and low blood pressure.  Pain.  If bacteria start to grow in the linings around the brain, it is called meningitis. This can cause severe headaches, many other problems, and even death.  If bacteria start to grow in a joint, it causes arthritis with painful joints. If bacteria start to grow in a bone, it is called osteomyelitis.  Bacteria from the blood can also cause sores (abscesses) in many organs, such as the muscle, liver, spleen, lungs, brain, and kidneys. DIAGNOSIS   This condition is diagnosed by cultures  of the blood.  Cultures may also be taken from other parts of the body that are thought to be causing the bacteremia. A small piece of tissue, fluid, or other product of the body is sampled. The sample is then put on a growth plate to see if any bacteria grows.  Other lab tests may be done and the results may be abnormal. TREATMENT  Treatment requires a stay in the hospital. You will be given antibiotic medicine through an IV access tube. PREVENTION  People with an increased risk of developing bacteremia or complications may be given antibiotics before certain procedures. Examples are:  A person with a heart murmur or artificial heart valve, before having his or her teeth cleaned.  Before having a surgical or other invasive procedure.  Before having a bowel procedure. Document Released: 08/24/2006 Document Revised: 02/02/2012 Document Reviewed: 06/05/2011 [facility] Patient Information 2015 [place], [place]. This information is not intended to replace advice given to you by your health care provider. Make sure you discuss any questions you have with your health care provider.

## 2015-06-09 NOTE — Progress Notes (Signed)
NM Stress test was found to be normal.   IMPRESSION: 1. No reversible ischemia. Small fixed defect is identified within the apical segment of the lateral wall.  2. Marked hypokinesis involves the anterior wall and septum.  3. Left ventricular ejection fraction 38%  4. Intermediate-risk stress test findings*.

## 2015-06-09 NOTE — Progress Notes (Signed)
Pt discharge education and instructions completed with pt and family at bedside; all voices understanding and denies any questions. Pt IV and telemetry removed; pt handed his prescription for multivitamin; pt also to pick up other electronically sent prescription from preferred pharmacy on file. Pt discharge home with family to transport him home. Pt handed his requested letter for his job; pt offered a wheelchair but declined and ambulated off unit with family and belongings to the side. Arabella MerlesP. Amo Derry Arbogast RN.

## 2015-06-09 NOTE — Progress Notes (Signed)
[place] portion of stress MPI completed. Nuclear scintigraphy to follow and read by radiologist.

## 2015-06-12 ENCOUNTER — Telehealth: Payer: Self-pay | Admitting: General Practice

## 2015-06-12 NOTE — Telephone Encounter (Signed)
Stp and gave appt with Dr.Stacks 7/28 at 11:10, pt aware to arrive 30 minutes prior with a copy of his insurance and a valid photo ID. [facility], [facility]. He has follow up scheduled with Dr.Hochrein for cardiac issues.

## 2015-06-21 ENCOUNTER — Telehealth: Payer: Self-pay | Admitting: Family Medicine

## 2015-06-21 ENCOUNTER — Ambulatory Visit (INDEPENDENT_AMBULATORY_CARE_PROVIDER_SITE_OTHER): Payer: BLUE CROSS/BLUE SHIELD | Admitting: Family Medicine

## 2015-06-21 ENCOUNTER — Ambulatory Visit (INDEPENDENT_AMBULATORY_CARE_PROVIDER_SITE_OTHER): Payer: BLUE CROSS/BLUE SHIELD

## 2015-06-21 ENCOUNTER — Encounter: Payer: Self-pay | Admitting: Family Medicine

## 2015-06-21 VITALS — BP 104/68 | HR 68 | Temp 97.0°F | Ht 72.0 in | Wt 159.2 lb

## 2015-06-21 DIAGNOSIS — J189 Pneumonia, unspecified organism: Secondary | ICD-10-CM | POA: Diagnosis not present

## 2015-06-21 DIAGNOSIS — I420 Dilated cardiomyopathy: Secondary | ICD-10-CM

## 2015-06-21 DIAGNOSIS — E876 Hypokalemia: Secondary | ICD-10-CM | POA: Diagnosis not present

## 2015-06-21 DIAGNOSIS — R739 Hyperglycemia, unspecified: Secondary | ICD-10-CM

## 2015-06-21 DIAGNOSIS — J439 Emphysema, unspecified: Secondary | ICD-10-CM | POA: Diagnosis not present

## 2015-06-21 DIAGNOSIS — R609 Edema, unspecified: Secondary | ICD-10-CM | POA: Diagnosis not present

## 2015-06-21 LAB — POCT CBC
Granulocyte percent: 68 %G (ref 37–80)
HCT, POC: 36.7 % — AB (ref 43.5–53.7)
Hemoglobin: 11.6 g/dL — AB (ref 14.1–18.1)
Lymph, poc: 1.4 (ref 0.6–3.4)
MCH, POC: 26.9 pg — AB (ref 27–31.2)
MCHC: 31.7 g/dL — AB (ref 31.8–35.4)
MCV: 85 fL (ref 80–97)
MPV: 7.4 fL (ref 0–99.8)
PLATELET COUNT, POC: 196 10*3/uL (ref 142–424)
POC Granulocyte: 4.6 (ref 2–6.9)
POC LYMPH %: 20.6 % (ref 10–50)
RBC: 4.32 M/uL — AB (ref 4.69–6.13)
RDW, POC: 17.5 %
WBC: 6.8 10*3/uL (ref 4.6–10.2)

## 2015-06-21 LAB — POCT GLYCOSYLATED HEMOGLOBIN (HGB A1C): HEMOGLOBIN A1C: 5.7

## 2015-06-21 MED ORDER — FLUTICASONE PROPIONATE HFA 110 MCG/ACT IN AERO: 2.0000 | INHALATION_SPRAY | Freq: Two times a day (BID) | RESPIRATORY_TRACT | Status: DC

## 2015-06-21 MED ORDER — BECLOMETHASONE DIPROPIONATE 80 MCG/ACT IN AERS: 2.0000 | INHALATION_SPRAY | Freq: Two times a day (BID) | RESPIRATORY_TRACT | Status: DC

## 2015-06-21 MED ORDER — BUDESONIDE 0.5 MG/2ML IN SUSP: 0.5000 mg | Freq: Two times a day (BID) | RESPIRATORY_TRACT | Status: AC

## 2015-06-21 NOTE — Progress Notes (Signed)
Subjective:  Patient ID: Miguel York, male    DOB: February 10, 1951  Age: 64 y.o. 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  He was septic from apneumonia as well. During treatment he went into acute renal failure. This in turn caused hyponatremia and hypoka  lemia. HE first had fluid support then diuresiswhile treated with Zosyn. Currently afebrile. Chronic cough now at baseline.   DC Summary reviewed in detail with pt.Currentyl he has 1+ edema that causes a sock imprint. He has DOE residual but can perform ADLs and walk about a block.  He is not having any palpitation or chest pain. Energy is fair, but improving. He has not smoked in 2 years, but has 60 pack-year hx. Hospital record disputes this stating he was counseled to quit due to current use.   Patient in for follow-up of elevated cholesterol. Doing well without complaints on current medication. Denies side effects of statin including myalgia and arthralgia and nausea. Also in today for liver function testing. Currently no chest pain, shortness of breath or other cardiovascular related symptoms noted.  He has no noted history of DM but had elevated glucose noted in [facility]. Needs definitive determination.  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   He has past surgical history that includes TEE without cardioversion (07/19/2012); Multiple extractions with alveoloplasty (07/20/2012); Coronary artery bypass graft (07/23/2012); Mitral valve repair (07/23/2012); Mouth  surgery; clipping left atrial appendage (07/23/2012); and No past surgeries.   His family history includes Heart attack in his mother and sister; Heart disease in his mother and sister; Heart failure in his mother.He reports that he quit smoking about 2 years ago. He has quit using smokeless tobacco. His smokeless tobacco use included Chew. He reports that he drinks about 8.4 oz of alcohol per week. He reports that he does not use illicit drugs.  Outpatient Prescriptions Prior to Visit  Medication Sig Dispense Refill  . aspirin EC 81 MG EC tablet Take 1 tablet (81 mg total) by mouth daily. 30 tablet 0  . atorvastatin (LIPITOR) 40 MG tablet Take 1 tablet (40 mg total) by mouth daily at 6 PM. 30 tablet 0  . carvedilol (COREG) 6.25 MG tablet Take 1 tablet (6.25 mg total) by mouth 2 (two) times daily with a meal. 60 tablet 0  . folic acid (FOLVITE) 1 MG tablet Take 1 tablet (1 mg total) by mouth daily. 30 tablet 0  . furosemide (LASIX) 20 MG tablet Take 1 tablet (20 mg total) by mouth daily. 30 tablet 0  . ibuprofen (ADVIL) 200 MG tablet Take 400 mg by mouth every 6 (six) hours as needed for mild pain or moderate pain.    Marland Kitchen lisinopril (PRINIVIL,ZESTRIL) 10 MG tablet Take 1 tablet (10 mg total) by mouth daily. 30 tablet 0  . spironolactone (ALDACTONE) 25 MG tablet Take 0.5 tablets (12.5 mg total) by mouth daily. 30 tablet 0  . Multiple Vitamin (MULTIVITAMIN WITH MINERALS) TABS tablet Take 1 tablet by mouth daily. (Patient not taking: Reported on 06/21/2015) 30 tablet 0  . thiamine 100 MG tablet Take 1 tablet (100 mg total)  by mouth daily. 30 tablet 0  . amoxicillin (AMOXIL) 500 MG capsule Take 1 capsule (500 mg total) by mouth 3 (three) times daily. (Patient not taking: Reported on 06/21/2015) 15 capsule 0  . Chlorphen-Pseudoephed-APAP (THERAFLU FLU/COLD PO) Take 15 mLs by mouth daily as needed (for cough).    . magnesium oxide (MAG-OX) 400 (241.3 MG) MG tablet Take 1 tablet (400 mg total) by mouth 2 (two)  times daily. (Patient not taking: Reported on 06/21/2015) 10 tablet 0   No facility-administered medications prior to visit.    ROS Review of Systems  Constitutional: Negative for fever, chills, diaphoresis, activity change, appetite change, fatigue and unexpected weight change.  HENT: Negative for congestion, ear pain, hearing loss, postnasal drip, rhinorrhea, sore throat, tinnitus and trouble swallowing.   Eyes: Negative for photophobia, pain, discharge and redness.  Respiratory: Negative for apnea, cough, choking, chest tightness, shortness of breath, wheezing and stridor.   Cardiovascular: Negative for chest pain, palpitations and leg swelling.  Gastrointestinal: Negative for nausea, vomiting, abdominal pain, diarrhea, constipation, blood in stool and abdominal distention.  Endocrine: Negative for cold intolerance, heat intolerance, polydipsia, polyphagia and polyuria.  Genitourinary: Negative for dysuria, urgency, frequency, hematuria, flank pain, enuresis, difficulty urinating and genital sores.  Musculoskeletal: Negative for joint swelling and arthralgias.  Skin: Negative for color change, rash and wound.  Allergic/Immunologic: Negative for immunocompromised state.  Neurological: Negative for dizziness, tremors, seizures, syncope, facial asymmetry, speech difficulty, weakness, light-headedness, numbness and headaches.  Hematological: Does not bruise/bleed easily.  Psychiatric/Behavioral: Negative for suicidal ideas, hallucinations, behavioral problems, confusion, sleep disturbance, dysphoric mood, decreased concentration and agitation. The patient is not nervous/anxious and is not hyperactive.     Objective:  BP 104/68 mmHg  Pulse 68  Temp(Src) 97 F (36.1 C) (Oral)  Ht 6' (1.829 m)  Wt 159 lb 3.2 oz (72.213 kg)  BMI 21.59 kg/m2  BP Readings from Last 3 Encounters:  06/21/15 104/68  06/09/15 120/75  04/06/14 123/79    Wt Readings from Last 3 Encounters:  06/21/15 159 lb  3.2 oz (72.213 kg)  06/09/15 165 lb 3.2 oz (74.934 kg)  04/06/14 155 lb 3.2 oz (70.398 kg)     Physical Exam  Constitutional: He is oriented to person, place, and time. He appears well-developed and well-nourished.  HENT:  Head: Normocephalic and atraumatic.  Mouth/Throat: Oropharynx is clear and moist.  Eyes: EOM are normal. Pupils are equal, round, and reactive to light.  Neck: Normal range of motion. No tracheal deviation present. No thyromegaly present.  Cardiovascular: Normal rate, regular rhythm and normal heart sounds.  Exam reveals no gallop and no friction rub.   No murmur heard. Pulmonary/Chest: Breath sounds normal. He has no wheezes. He has no rales.  Abdominal: Soft. He exhibits no mass. There is no tenderness.  Musculoskeletal: Normal range of motion. He exhibits no edema.  Neurological: He is alert and oriented to person, place, and time.  Skin: Skin is warm and dry.  Psychiatric: He has a normal mood and affect.    Lab Results  Component Value Date   HGBA1C 5.7 06/21/2015   HGBA1C 5.7* 07/16/2012    Lab Results  Component Value Date   WBC 6.8 06/21/2015   HGB 11.6* 06/21/2015   HCT 36.7* 06/21/2015   PLT 134* 06/08/2015   GLUCOSE 89 06/21/2015   CHOL 131 07/16/2012   TRIG 60 07/16/2012   HDL 29* 07/16/2012   LDLCALC 90 07/16/2012   ALT 30 06/21/2015   AST 38  06/21/2015   NA 132* 06/21/2015   K 4.4 06/21/2015   CL 98 06/21/2015   CREATININE 0.94 06/21/2015   BUN 10 06/21/2015   CO2 21 06/21/2015   TSH 5.151* 07/16/2012   INR 1.37 06/05/2015   HGBA1C 5.7 06/21/2015    Nm Pulmonary Perf And Vent  06/04/2015   CLINICAL DATA:  64 year old male with shortness of breath, COPD  EXAM: NUCLEAR MEDICINE VENTILATION - PERFUSION LUNG SCAN  TECHNIQUE: Ventilation images were obtained in multiple projections using inhaled aerosol Tc-40mDTPA. Perfusion images were obtained in multiple projections after intravenous i Switch to in in PICC njection of Tc-92mAA.   RADIOPHARMACEUTICALS:  36.9 mCi of Technetium-9978mPA aerosol inhalation and 5.69 mCi Technetium-27m42m IV  COMPARISON:  Chest radiograph dated 06/03/2015  FINDINGS: Ventilation: There are central deposition of the radiotracer compatible with obstructive airway disease. Large area of ventilation defect involving the entire left lower lobe as well as subsegmental left upper lobe  Perfusion: There is perfusion defect predominantly involving the superior segment of the left upper lobe with residual profusion at the basal segments of the left lower lobe.  On the chest radiograph an area of opacity is noted in the left mid lung field.  IMPRESSION: Large perfusion and ventilation defect involving the left lower lobe. Overall the ventilation defect is larger than the perfusion defect and likely correspond to the airspace opacity seen on the radiograph. Findings are intermediate for pulmonary embolism. Clinical correlation is recommended.   Electronically Signed   By: ArasAnner Crete.   On: 06/04/2015 01:41   Dg Chest Port 1 View  06/04/2015   CLINICAL DATA:  Shortness of breath.  EXAM: PORTABLE CHEST - 1 VIEW  COMPARISON:  06/03/2015.  FINDINGS: Mediastinum hilar structures normal. Prior CABG. Cardiomegaly with normal pulmonary vascularity. Left lower lobe lobe atelectasis/ infiltrate with left pleural effusion. No pneumothorax.  IMPRESSION: 1. Persistent left lower lobe atelectasis/infiltrate. Small left pleural effusion. 2. Prior CABG.  Cardiomegaly with normal pulmonary vascularity.   Electronically Signed   By: ThomMarcello Mooresgister   On: 06/04/2015 07:12   Dg Chest Portable 1 View  06/03/2015   CLINICAL DATA:  Patient with shortness of breath since Friday. Prior history of pneumonia.  EXAM: PORTABLE CHEST - 1 VIEW  COMPARISON:  Chest radiograph 04/06/2014.  FINDINGS: Stable enlarged cardiac and mediastinal contours. Consolidative opacities within the left mid and lower lung. No pleural effusion or  pneumothorax.  IMPRESSION: Left mid lung consolidative opacities, concerning for pneumonia. Followup PA and lateral chest X-ray is recommended in 3-4 weeks following trial of antibiotic therapy to ensure resolution and exclude underlying malignancy.   Electronically Signed   By: DrewLovey Newcomer.   On: 06/03/2015 15:30    Assessment & Plan:   CharReuven seen today for establish care and hospitalization follow-up.  Diagnoses and all orders for this visit:  Congestive dilated cardiomyopathy Orders: -     POCT CBC -     CMP14+EGFR -     DG Chest 2 View; Future -     Brain natriuretic peptide  CAP (community acquired pneumonia) Orders: -     POCT CBC  Hypokalemia Orders: -     CMP14+EGFR  Hyperglycemia Orders: -     POCT glycosylated hemoglobin (Hb A1C) -     CMP14+EGFR  Edema Orders: -     POCT CBC -     DG Chest 2 View; Future -     Brain natriuretic  peptide  Pulmonary emphysema, unspecified emphysema type Orders: -     POCT CBC -     DG Chest 2 View; Future -     PR BREATHING CAPACITY TEST  Other orders -     Discontinue: beclomethasone (QVAR) 80 MCG/ACT inhaler; Inhale 2 puffs into the lungs 2 (two) times daily.  His renal function has returned to normal. His electrolytes have normalized.  I have discontinued Mr. Lofland's Chlorphen-Pseudoephed-APAP [facility] FLU/COLD PO), magnesium oxide, and amoxicillin. I am also having him maintain his ibuprofen, aspirin, atorvastatin, lisinopril, carvedilol, furosemide, spironolactone, folic acid, multivitamin with minerals, and thiamine.  Meds ordered this encounter  Medications  . DISCONTD: beclomethasone (QVAR) 80 MCG/ACT inhaler    Sig: Inhale 2 puffs into the lungs 2 (two) times daily.    Dispense:  1 Inhaler    Refill:  12    His insurance would not cover inhaled steroids, so he was switched to nebulized budesonide subsequent to pharmacy notification. This change was reviewed dwith him by phone.  His cardiac staus  appears stable, so those meds will continue. However, carvedilol, as imporatnata as it is for his cardiac status is questionable due to COPD. If QVar doesn't improve his COPD, the beta blocker may have to be reconsidered. His HbA1C indicates borderline prediabetes. At this time he is counselled regarding carb controlled diet. Will not start med unless A1c reaches 6.0 since it is unchanged over the last three years.   He is still edematous. He may need furrther diuresis, but considering recent renal failure, will base that on BNP as well as dyspnea. He should perform daily weight. Consider increassing diuresis if weight increasess 2+ pouinds.  At this time he seems rather overwhelmed by the degree of discussion and recommendation. Therefore, will hold off on nutrition consult until folloow up. Salt avoidance reviewed today.    Follow-up: Return in about 1 month (around 07/22/2015).  Claretta Fraise, M.D.

## 2015-06-21 NOTE — Addendum Note (Signed)
Addended by: Bearl Mulberry on: 06/21/2015 07:08 PM   Modules accepted: Orders

## 2015-06-21 NOTE — Telephone Encounter (Signed)
Per pharmacist at [facility], new script is also $100 Can med be changed to nebulizer RX Pt has machine at home Please advise

## 2015-06-21 NOTE — Telephone Encounter (Signed)
Tel pt. Scrip sent for replacement. Should be cheaper.

## 2015-06-21 NOTE — Telephone Encounter (Signed)
[facility] to prescribe one nebulizer treatment twice daily

## 2015-06-21 NOTE — Addendum Note (Signed)
Addended by: Mechele Claude on: 06/21/2015 05:49 PM   Modules accepted: Orders, Medications

## 2015-06-21 NOTE — Telephone Encounter (Signed)
Patient can not afford new medication given to him today please advise

## 2015-06-22 LAB — CMP14+EGFR
ALK PHOS: 91 IU/L (ref 39–117)
ALT: 30 IU/L (ref 0–44)
AST: 38 IU/L (ref 0–40)
Albumin/Globulin Ratio: 0.6 — ABNORMAL LOW (ref 1.1–2.5)
Albumin: 2.9 g/dL — ABNORMAL LOW (ref 3.6–4.8)
BUN/Creatinine Ratio: 11 (ref 10–22)
BUN: 10 mg/dL (ref 8–27)
Bilirubin Total: 0.5 mg/dL (ref 0.0–1.2)
CO2: 21 mmol/L (ref 18–29)
Calcium: 8.6 mg/dL (ref 8.6–10.2)
Chloride: 98 mmol/L (ref 97–108)
Creatinine, Ser: 0.94 mg/dL (ref 0.76–1.27)
GFR calc non Af Amer: 87 mL/min/{1.73_m2} (ref >59)
GFR, EST AFRICAN AMERICAN: 100 mL/min/{1.73_m2} (ref >59)
GLOBULIN, TOTAL: 4.7 g/dL — AB (ref 1.5–4.5)
GLUCOSE: 89 mg/dL (ref 65–99)
Potassium: 4.4 mmol/L (ref 3.5–5.2)
SODIUM: 132 mmol/L — AB (ref 134–144)
Total Protein: 7.6 g/dL (ref 6.0–8.5)

## 2015-06-22 LAB — BRAIN NATRIURETIC PEPTIDE: BNP: 229.7 pg/mL — AB (ref 0.0–100.0)

## 2015-06-22 NOTE — Telephone Encounter (Signed)
Pt aware nebulizer med has been sent to pharmacy

## 2015-07-18 ENCOUNTER — Ambulatory Visit: Payer: BLUE CROSS/BLUE SHIELD | Admitting: Family Medicine

## 2015-07-18 ENCOUNTER — Encounter: Payer: Self-pay | Admitting: Cardiology

## 2015-07-18 ENCOUNTER — Ambulatory Visit (INDEPENDENT_AMBULATORY_CARE_PROVIDER_SITE_OTHER): Payer: BLUE CROSS/BLUE SHIELD | Admitting: Cardiology

## 2015-07-18 VITALS — BP 136/80 | HR 68 | Ht 72.0 in | Wt 168.0 lb

## 2015-07-18 DIAGNOSIS — I5022 Chronic systolic (congestive) heart failure: Secondary | ICD-10-CM

## 2015-07-18 DIAGNOSIS — I420 Dilated cardiomyopathy: Secondary | ICD-10-CM

## 2015-07-18 DIAGNOSIS — I2510 Atherosclerotic heart disease of native coronary artery without angina pectoris: Secondary | ICD-10-CM | POA: Diagnosis not present

## 2015-07-18 MED ORDER — FUROSEMIDE 20 MG PO TABS: 20.0000 mg | ORAL_TABLET | Freq: Every day | ORAL | Status: AC

## 2015-07-18 MED ORDER — CARVEDILOL 3.125 MG PO TABS: 3.1250 mg | ORAL_TABLET | Freq: Two times a day (BID) | ORAL | Status: AC

## 2015-07-18 NOTE — Progress Notes (Signed)
Cardiology Office Note   Date:  07/18/2015   ID:  Miguel York, DOB 1951/02/08, MRN [id number]  PCP:  Mechele Claude, MD  Cardiologist:   Rollene Rotunda, MD   Chief Complaint  Patient presents with  . Coronary Artery Disease  . Cardiomyopathy      History of Present Illness: Miguel York is a 64 y.o. male who presents for evaluation of coronary disease and a cardiomyopathy. The patient has a complicated history of coronary disease, bypass surgery, ischemic cardiomyopathy, mitral valve repair and noncompliance with follow-up in medications. He was hospitalized with sepsis and apparent community-acquired pneumonia earlier this year. He did have an echocardiogram. His previous ejection fraction had been about 45% of the time of bypass surgery. However, there was now lower at 25%. He did have a stress perfusion study which was negative for any evidence of ischemia. He was discharged on the meds as listed below. However, for at least the last he had been taking anything as he ran out. He says the medicines make him feel worse. Currently without medications he is denying any shortness of breath, PND or orthopnea. He has had no palpitations, presyncope or syncope. He has had no chest pressure, neck or arm discomfort. He's had some lower extremity swelling off his Lasix which I think is really why he presented. He does continue to smoke cigarettes and hasn't been able to stop this. He does say he avoids salt.  Past Medical History  Diagnosis Date  . COPD (chronic obstructive pulmonary disease)   . Systolic CHF 07/15/2012    Echo 08/03/12 EF 25%, diffuse hypokinesis, trivial MR, mild R/LAE, mod TR, PASP  . Coronary artery disease 07/15/2012    S/P 4v CABG 07/23/12  . 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 S/P mitral valve repair 07/23/2012    26 mm Sorin Memo 3D ring  annuloplasty  . Ischemic cardiomyopathy     EF 25%, LifeVest placed 08/08/12  . History of tobacco abuse     quit 06/2012  . History of ETOH abuse     quit 06/2012  . Hyponatremia 07/2012  . Iron deficiency anemia 07/2012  . Elevated LFTs 07/2012  . HLD (hyperlipidemia)     Past Surgical History  Procedure Laterality Date  . Tee without cardioversion  07/19/2012    Procedure: TRANSESOPHAGEAL ECHOCARDIOGRAM (TEE);  Surgeon: Pricilla Riffle, MD;  Location: [facility] ENDOSCOPY;  Service: Cardiovascular;  Laterality: N/A;  . Multiple extractions with alveoloplasty  07/20/2012    Procedure: MULTIPLE EXTRACION WITH ALVEOLOPLASTY;  Surgeon: Charlynne Pander, DDS;  Location: [facility] OR;  Service: Oral Surgery;  Laterality: N/A;  Extraction of tooth #'s 2,3,4,5,6,7,8,9,10,11, 17,19, 20, 21,22,27,28,29 with alveoloplasty  . Coronary artery bypass graft  07/23/2012    Procedure: CORONARY ARTERY BYPASS GRAFTING (CABG);  Surgeon: Purcell Nails, MD;  Location: [facility] OR;  Service: Open Heart Surgery;  Laterality: N/A;  . Mitral valve repair  07/23/2012    Procedure: MITRAL VALVE REPAIR (MVR);  Surgeon: Purcell Nails, MD;  Location: [facility] OR;  Service: Open Heart Surgery;  Laterality: N/A;  . Mouth surgery    . Clipping left atrial appendage  07/23/2012  . No past surgeries       Current Outpatient Prescriptions  Medication Sig Dispense Refill  . aspirin EC 81 MG  EC tablet Take 1 tablet (81 mg total) by mouth daily. 30 tablet 0  . atorvastatin (LIPITOR) 40 MG tablet Take 1 tablet (40 mg total) by mouth daily at 6 PM. 30 tablet 0  . budesonide (PULMICORT) 0.5 MG/2ML nebulizer solution Take 2 mLs (0.5 mg total) by nebulization 2 (two) times daily. 120 mL 1  . folic acid (FOLVITE) 1 MG tablet Take 1 tablet (1 mg total) by mouth daily. 30 tablet 0  . ibuprofen (ADVIL) 200 MG tablet Take 400 mg by mouth every 6 (six) hours as needed for mild pain or moderate pain.    . Multiple Vitamin (MULTIVITAMIN WITH MINERALS) TABS tablet  Take 1 tablet by mouth daily. 30 tablet 0  . thiamine 100 MG tablet Take 1 tablet (100 mg total) by mouth daily. 30 tablet 0  . carvedilol (COREG) 3.125 MG tablet Take 1 tablet (3.125 mg total) by mouth 2 (two) times daily with a meal. 60 tablet 3  . furosemide (LASIX) 20 MG tablet Take 1 tablet (20 mg total) by mouth daily. 30 tablet 6  . lisinopril (PRINIVIL,ZESTRIL) 10 MG tablet Take 1 tablet (10 mg total) by mouth daily. (Patient not taking: Reported on 07/18/2015) 30 tablet 0  . spironolactone (ALDACTONE) 25 MG tablet Take 0.5 tablets (12.5 mg total) by mouth daily. (Patient not taking: Reported on 07/18/2015) 30 tablet 0   No current facility-administered medications for this visit.    Allergies:   Review of patient's allergies indicates no known allergies.    ROS:  Please see the history of present illness.   Otherwise, review of systems are positive for none.   All other systems are reviewed and negative.    PHYSICAL EXAM: VS:  BP 136/80 mmHg  Pulse 68  Ht 6' (1.829 m)  Wt 168 lb (76.204 kg)  BMI 22.78 kg/m2 , BMI Body mass index is 22.78 kg/(m^2). GENERAL:  Well appearing HEENT:  Pupils equal round and reactive, fundi not visualized, oral mucosa unremarkable NECK:  No jugular venous distention, waveform within normal limits, carotid upstroke brisk and symmetric, no bruits, no thyromegaly LUNGS:  Clear to auscultation bilaterally BACK:  No CVA tenderness CHEST:  Well healed sternotomy scar, gynecomastia HEART:  PMI not displaced or sustained,S1 and S2 within normal limits, no S3, no S4, no clicks, no rubs, no murmurs ABD:  Flat, positive bowel sounds normal in frequency in pitch, no bruits, no rebound, no guarding, no midline pulsatile mass, no hepatomegaly, no splenomegaly EXT:  2 plus pulses throughout, mild bilateral edema, no cyanosis no clubbing SKIN:  No rashes no nodules   EKG:  EKG is not ordered today.    Recent Labs: 06/08/2015: Magnesium 1.8; Platelets  134* 06/21/2015: ALT 30; BNP 229.7*; BUN 10; Creatinine, Ser 0.94; Hemoglobin 11.6*; Potassium 4.4; Sodium 132*    Lipid Panel    Component Value Date/Time   CHOL 131 07/16/2012 0500   TRIG 60 07/16/2012 0500   HDL 29* 07/16/2012 0500   CHOLHDL 4.5 07/16/2012 0500   VLDL 12 07/16/2012 0500   LDLCALC 90 07/16/2012 0500      Wt Readings from Last 3 Encounters:  07/18/15 168 lb (76.204 kg)  06/21/15 159 lb 3.2 oz (72.213 kg)  06/09/15 165 lb 3.2 oz (74.934 kg)      Other studies Reviewed: Additional studies/ records that were reviewed today include: Extensive review of hospital records.   (Greater than 40 minutes reviewing all data with greater than 50% face to face with  the patient). Review of the above records demonstrates:  Please see elsewhere in the note.     ASSESSMENT AND PLAN:  CARDIOMYOPATHY:  I had a long discussion with the patient and his daughter about the need for medication compliance and the risk of progressive cardiomyopathy with noncompliance and continued use of alcohol. At this point he agrees to restart medications but I will need to do this one step at a time. If he feels poorly on these medicines he will stop again. Therefore, I'm going to continue the Lasix and restart the carvedilol. I'll see him every 2 weeks for med titration. At some point I will follow-up with a repeat echocardiogram to see if he's had improvement.  CAD:  He had no evidence of ischemia recently. He will continue with risk reduction.  TOBACCO ABUSE: I will continue to educate him  ETOH:  He has gynecomastia. I suspect he has significant alcohol use more than stated. He will continue to need counseling about this.   Current medicines are reviewed at length with the patient today.  The patient has concerns regarding medicines.  The following changes have been made:  As above  Labs/ tests ordered today include: None     Disposition:   FU with me in two weeks.    Signed, Rollene Rotunda, MD  07/18/2015 1:00 PM    [facility]

## 2015-07-18 NOTE — Patient Instructions (Addendum)
Medication Instructions:  Please start Carvedilol 3.125 mg twice a day, Furosemide 20 mg a day and ASA 81 mg a day.  Follow-Up: Follow as scheduled to see Dr Antoine Poche in [place].  Thank you for choosing [facility]!!

## 2015-08-01 ENCOUNTER — Ambulatory Visit: Payer: Self-pay | Admitting: Cardiology

## 2015-11-28 ENCOUNTER — Telehealth: Payer: Self-pay | Admitting: Family Medicine

## 2015-11-28 NOTE — Telephone Encounter (Signed)
DECLINED

## 2016-04-24 DEATH — deceased
